# Patient Record
Sex: Female | Born: 1955 | Race: White | Hispanic: No | Marital: Married | Smoking: Never smoker
Health system: Southern US, Community
[De-identification: ages and names within clinical notes are randomized; demographics above are authoritative.]

## PROBLEM LIST (undated history)

## (undated) DIAGNOSIS — R17 Unspecified jaundice: Secondary | ICD-10-CM

## (undated) DIAGNOSIS — K922 Gastrointestinal hemorrhage, unspecified: Secondary | ICD-10-CM

## (undated) DIAGNOSIS — F419 Anxiety disorder, unspecified: Secondary | ICD-10-CM

## (undated) DIAGNOSIS — D721 Eosinophilia: Secondary | ICD-10-CM

## (undated) DIAGNOSIS — K7682 Hepatic encephalopathy: Secondary | ICD-10-CM

## (undated) DIAGNOSIS — R748 Abnormal levels of other serum enzymes: Secondary | ICD-10-CM

## (undated) DIAGNOSIS — L270 Generalized skin eruption due to drugs and medicaments taken internally: Secondary | ICD-10-CM

## (undated) DIAGNOSIS — K7290 Hepatic failure, unspecified without coma: Secondary | ICD-10-CM

## (undated) DIAGNOSIS — C801 Malignant (primary) neoplasm, unspecified: Secondary | ICD-10-CM

## (undated) DIAGNOSIS — D7212 Drug rash with eosinophilia and systemic symptoms syndrome: Secondary | ICD-10-CM

## (undated) DIAGNOSIS — T50905A Adverse effect of unspecified drugs, medicaments and biological substances, initial encounter: Secondary | ICD-10-CM

## (undated) HISTORY — PX: LIVER RESECTION: SHX1977

## (undated) HISTORY — PX: KNEE ARTHROSCOPY: SUR90

## (undated) HISTORY — PX: CHOLECYSTECTOMY: SHX55

---

## 1999-12-03 ENCOUNTER — Encounter: Payer: Self-pay | Admitting: Obstetrics and Gynecology

## 1999-12-03 ENCOUNTER — Encounter: Admission: RE | Admit: 1999-12-03 | Discharge: 1999-12-03 | Payer: Self-pay | Admitting: Obstetrics and Gynecology

## 2001-02-07 ENCOUNTER — Encounter: Admission: RE | Admit: 2001-02-07 | Discharge: 2001-02-07 | Payer: Self-pay | Admitting: Obstetrics and Gynecology

## 2001-02-07 ENCOUNTER — Encounter: Payer: Self-pay | Admitting: Obstetrics and Gynecology

## 2002-03-15 ENCOUNTER — Encounter: Payer: Self-pay | Admitting: Obstetrics and Gynecology

## 2002-03-15 ENCOUNTER — Encounter: Admission: RE | Admit: 2002-03-15 | Discharge: 2002-03-15 | Payer: Self-pay | Admitting: Obstetrics and Gynecology

## 2002-03-22 ENCOUNTER — Encounter: Payer: Self-pay | Admitting: Obstetrics and Gynecology

## 2002-03-22 ENCOUNTER — Encounter: Admission: RE | Admit: 2002-03-22 | Discharge: 2002-03-22 | Payer: Self-pay | Admitting: Obstetrics and Gynecology

## 2003-04-11 ENCOUNTER — Encounter: Admission: RE | Admit: 2003-04-11 | Discharge: 2003-04-11 | Payer: Self-pay | Admitting: Obstetrics and Gynecology

## 2004-04-28 ENCOUNTER — Encounter: Admission: RE | Admit: 2004-04-28 | Discharge: 2004-04-28 | Payer: Self-pay | Admitting: Obstetrics and Gynecology

## 2005-05-11 ENCOUNTER — Encounter: Admission: RE | Admit: 2005-05-11 | Discharge: 2005-05-11 | Payer: Self-pay | Admitting: Obstetrics and Gynecology

## 2006-09-06 ENCOUNTER — Encounter: Admission: RE | Admit: 2006-09-06 | Discharge: 2006-09-06 | Payer: Self-pay | Admitting: Obstetrics and Gynecology

## 2007-09-21 ENCOUNTER — Encounter: Admission: RE | Admit: 2007-09-21 | Discharge: 2007-09-21 | Payer: Self-pay | Admitting: Family Medicine

## 2007-11-02 ENCOUNTER — Other Ambulatory Visit: Admission: RE | Admit: 2007-11-02 | Discharge: 2007-11-02 | Payer: Self-pay | Admitting: Family Medicine

## 2008-11-05 ENCOUNTER — Encounter: Admission: RE | Admit: 2008-11-05 | Discharge: 2008-11-05 | Payer: Self-pay | Admitting: Family Medicine

## 2009-11-30 ENCOUNTER — Encounter: Admission: RE | Admit: 2009-11-30 | Discharge: 2009-11-30 | Payer: Self-pay | Admitting: Family Medicine

## 2010-04-09 ENCOUNTER — Other Ambulatory Visit: Payer: Self-pay | Admitting: Family Medicine

## 2010-04-09 ENCOUNTER — Other Ambulatory Visit (HOSPITAL_COMMUNITY)
Admission: RE | Admit: 2010-04-09 | Discharge: 2010-04-09 | Disposition: A | Discharge status: Home / Self Care | Payer: 59 | Source: Ambulatory Visit | Attending: Family Medicine | Admitting: Family Medicine

## 2010-04-09 DIAGNOSIS — Z01419 Encounter for gynecological examination (general) (routine) without abnormal findings: Secondary | ICD-10-CM | POA: Insufficient documentation

## 2011-01-03 ENCOUNTER — Other Ambulatory Visit: Payer: Self-pay | Admitting: Family Medicine

## 2011-01-03 DIAGNOSIS — Z1231 Encounter for screening mammogram for malignant neoplasm of breast: Secondary | ICD-10-CM

## 2011-01-26 ENCOUNTER — Ambulatory Visit
Admission: RE | Admit: 2011-01-26 | Discharge: 2011-01-26 | Disposition: A | Discharge status: Home / Self Care | Payer: 59 | Source: Ambulatory Visit | Attending: Family Medicine | Admitting: Family Medicine

## 2011-01-26 DIAGNOSIS — Z1231 Encounter for screening mammogram for malignant neoplasm of breast: Secondary | ICD-10-CM

## 2011-12-19 ENCOUNTER — Other Ambulatory Visit: Payer: Self-pay | Admitting: Family Medicine

## 2011-12-19 DIAGNOSIS — Z1231 Encounter for screening mammogram for malignant neoplasm of breast: Secondary | ICD-10-CM

## 2012-01-27 ENCOUNTER — Ambulatory Visit
Admission: RE | Admit: 2012-01-27 | Discharge: 2012-01-27 | Disposition: A | Discharge status: Home / Self Care | Payer: 59 | Source: Ambulatory Visit | Attending: Family Medicine | Admitting: Family Medicine

## 2012-01-27 DIAGNOSIS — Z1231 Encounter for screening mammogram for malignant neoplasm of breast: Secondary | ICD-10-CM

## 2013-02-19 ENCOUNTER — Other Ambulatory Visit: Payer: Self-pay

## 2013-02-19 DIAGNOSIS — Z1231 Encounter for screening mammogram for malignant neoplasm of breast: Secondary | ICD-10-CM

## 2013-03-12 ENCOUNTER — Other Ambulatory Visit: Payer: Self-pay | Admitting: Family Medicine

## 2013-03-12 DIAGNOSIS — R945 Abnormal results of liver function studies: Principal | ICD-10-CM

## 2013-03-12 DIAGNOSIS — R7989 Other specified abnormal findings of blood chemistry: Secondary | ICD-10-CM

## 2013-03-15 ENCOUNTER — Ambulatory Visit
Admission: RE | Admit: 2013-03-15 | Discharge: 2013-03-15 | Disposition: A | Discharge status: Home / Self Care | Payer: BC Managed Care – PPO | Source: Ambulatory Visit | Attending: Family Medicine | Admitting: Family Medicine

## 2013-03-15 DIAGNOSIS — R945 Abnormal results of liver function studies: Principal | ICD-10-CM

## 2013-03-15 DIAGNOSIS — R7989 Other specified abnormal findings of blood chemistry: Secondary | ICD-10-CM

## 2013-03-18 ENCOUNTER — Other Ambulatory Visit: Payer: Self-pay | Admitting: Gastroenterology

## 2013-03-18 DIAGNOSIS — R945 Abnormal results of liver function studies: Secondary | ICD-10-CM

## 2013-03-23 ENCOUNTER — Other Ambulatory Visit: Payer: BC Managed Care – PPO

## 2013-03-23 ENCOUNTER — Ambulatory Visit
Admission: RE | Admit: 2013-03-23 | Discharge: 2013-03-23 | Disposition: A | Discharge status: Home / Self Care | Payer: BC Managed Care – PPO | Source: Ambulatory Visit | Attending: Gastroenterology | Admitting: Gastroenterology

## 2013-03-23 DIAGNOSIS — R945 Abnormal results of liver function studies: Secondary | ICD-10-CM

## 2013-03-23 MED ORDER — GADOBENATE DIMEGLUMINE 529 MG/ML IV SOLN
11.0000 mL | Freq: Once | INTRAVENOUS | Status: AC | PRN
  Administered 2013-03-23: 11 mL via INTRAVENOUS

## 2013-03-25 ENCOUNTER — Ambulatory Visit
Admission: RE | Admit: 2013-03-25 | Discharge: 2013-03-25 | Disposition: A | Discharge status: Home / Self Care | Payer: BC Managed Care – PPO | Source: Ambulatory Visit

## 2013-03-25 DIAGNOSIS — Z1231 Encounter for screening mammogram for malignant neoplasm of breast: Secondary | ICD-10-CM

## 2013-03-26 ENCOUNTER — Encounter (HOSPITAL_COMMUNITY): Payer: Self-pay | Admitting: *Deleted

## 2013-03-26 ENCOUNTER — Encounter (HOSPITAL_COMMUNITY): Payer: Self-pay | Admitting: Pharmacy Technician

## 2013-03-26 DIAGNOSIS — R17 Unspecified jaundice: Secondary | ICD-10-CM

## 2013-03-26 DIAGNOSIS — R748 Abnormal levels of other serum enzymes: Secondary | ICD-10-CM

## 2013-03-26 HISTORY — DX: Abnormal levels of other serum enzymes: R74.8

## 2013-03-26 HISTORY — DX: Unspecified jaundice: R17

## 2013-04-03 ENCOUNTER — Other Ambulatory Visit: Payer: Self-pay | Admitting: Gastroenterology

## 2013-04-03 ENCOUNTER — Encounter (HOSPITAL_COMMUNITY): Payer: BC Managed Care – PPO | Admitting: Certified Registered Nurse Anesthetist

## 2013-04-03 ENCOUNTER — Ambulatory Visit (HOSPITAL_COMMUNITY): Payer: BC Managed Care – PPO | Admitting: Certified Registered Nurse Anesthetist

## 2013-04-03 ENCOUNTER — Encounter (HOSPITAL_COMMUNITY)
Admission: RE | Disposition: A | Discharge status: Home / Self Care | Payer: Self-pay | Source: Ambulatory Visit | Attending: Gastroenterology

## 2013-04-03 ENCOUNTER — Ambulatory Visit (HOSPITAL_COMMUNITY)
Admission: RE | Admit: 2013-04-03 | Discharge: 2013-04-03 | Disposition: A | Discharge status: Home / Self Care | Payer: BC Managed Care – PPO | Source: Ambulatory Visit | Attending: Gastroenterology | Admitting: Gastroenterology

## 2013-04-03 ENCOUNTER — Encounter (HOSPITAL_COMMUNITY): Payer: Self-pay | Admitting: *Deleted

## 2013-04-03 DIAGNOSIS — K769 Liver disease, unspecified: Secondary | ICD-10-CM | POA: Insufficient documentation

## 2013-04-03 DIAGNOSIS — K838 Other specified diseases of biliary tract: Secondary | ICD-10-CM | POA: Insufficient documentation

## 2013-04-03 DIAGNOSIS — K831 Obstruction of bile duct: Secondary | ICD-10-CM

## 2013-04-03 DIAGNOSIS — K219 Gastro-esophageal reflux disease without esophagitis: Secondary | ICD-10-CM | POA: Insufficient documentation

## 2013-04-03 HISTORY — DX: Anxiety disorder, unspecified: F41.9

## 2013-04-03 HISTORY — DX: Unspecified jaundice: R17

## 2013-04-03 HISTORY — DX: Abnormal levels of other serum enzymes: R74.8

## 2013-04-03 HISTORY — PX: EUS: SHX5427

## 2013-04-03 LAB — COMPREHENSIVE METABOLIC PANEL
ALK PHOS: 1432 U/L — AB (ref 39–117)
ALT: 541 U/L — AB (ref 0–35)
AST: 385 U/L — ABNORMAL HIGH (ref 0–37)
Albumin: 2.4 g/dL — ABNORMAL LOW (ref 3.5–5.2)
BILIRUBIN TOTAL: 15.7 mg/dL — AB (ref 0.3–1.2)
BUN: 14 mg/dL (ref 6–23)
CO2: 25 meq/L (ref 19–32)
Calcium: 9.4 mg/dL (ref 8.4–10.5)
Chloride: 100 mEq/L (ref 96–112)
Creatinine, Ser: 0.66 mg/dL (ref 0.50–1.10)
GFR calc Af Amer: >90 mL/min (ref >90)
GLUCOSE: 81 mg/dL (ref 70–99)
POTASSIUM: 4.7 meq/L (ref 3.7–5.3)
Sodium: 136 mEq/L — ABNORMAL LOW (ref 137–147)
Total Protein: 6.3 g/dL (ref 6.0–8.3)

## 2013-04-03 LAB — PROTIME-INR
INR: 1.04 (ref 0.00–1.49)
Prothrombin Time: 13.4 s (ref 11.6–15.2)

## 2013-04-03 SURGERY — ESOPHAGEAL ENDOSCOPIC ULTRASOUND (EUS) RADIAL
Anesthesia: Monitor Anesthesia Care

## 2013-04-03 MED ORDER — BUTAMBEN-TETRACAINE-BENZOCAINE 2-2-14 % EX AERO
INHALATION_SPRAY | CUTANEOUS | Status: DC | PRN
  Administered 2013-04-03: 2 via TOPICAL

## 2013-04-03 MED ORDER — ONDANSETRON HCL 4 MG/2ML IJ SOLN
INTRAMUSCULAR | Status: DC | PRN
  Administered 2013-04-03: 4 mg via INTRAVENOUS

## 2013-04-03 MED ORDER — SODIUM CHLORIDE 0.9 % IV SOLN: INTRAVENOUS | Status: DC

## 2013-04-03 MED ORDER — ONDANSETRON HCL 4 MG/2ML IJ SOLN
INTRAMUSCULAR | Status: AC
  Filled 2013-04-03: qty 2

## 2013-04-03 MED ORDER — PROPOFOL INFUSION 10 MG/ML OPTIME
INTRAVENOUS | Status: DC | PRN
  Administered 2013-04-03: 140 ug/kg/min via INTRAVENOUS

## 2013-04-03 MED ORDER — CHOLESTYRAMINE 4 G PO PACK: 4.0000 g | PACK | Freq: Two times a day (BID) | ORAL | Status: DC

## 2013-04-03 MED ORDER — KETAMINE HCL 10 MG/ML IJ SOLN
INTRAMUSCULAR | Status: DC | PRN
  Administered 2013-04-03: 20 mg via INTRAVENOUS

## 2013-04-03 MED ORDER — LIDOCAINE HCL (CARDIAC) 20 MG/ML IV SOLN
INTRAVENOUS | Status: AC
  Filled 2013-04-03: qty 5

## 2013-04-03 MED ORDER — MIDAZOLAM HCL 2 MG/2ML IJ SOLN
INTRAMUSCULAR | Status: AC
  Filled 2013-04-03: qty 2

## 2013-04-03 MED ORDER — LIDOCAINE HCL (CARDIAC) 20 MG/ML IV SOLN
INTRAVENOUS | Status: DC | PRN
  Administered 2013-04-03: 100 mg via INTRAVENOUS

## 2013-04-03 MED ORDER — PROPOFOL 10 MG/ML IV BOLUS
INTRAVENOUS | Status: AC
  Filled 2013-04-03: qty 20

## 2013-04-03 MED ORDER — MIDAZOLAM HCL 5 MG/5ML IJ SOLN
INTRAMUSCULAR | Status: DC | PRN
  Administered 2013-04-03 (×2): 2 mg via INTRAVENOUS

## 2013-04-03 MED ORDER — LACTATED RINGERS IV SOLN
INTRAVENOUS | Status: DC
  Administered 2013-04-03: 1000 mL via INTRAVENOUS

## 2013-04-03 NOTE — Anesthesia Postprocedure Evaluation (Signed)
  Anesthesia Post-op Note  Patient: Joann Castaneda  Procedure(s) Performed: Procedure(s) (LRB): ESOPHAGEAL ENDOSCOPIC ULTRASOUND (EUS) RADIAL (N/A)  Patient Location: PACU  Anesthesia Type: MAC  Level of Consciousness: awake and alert   Airway and Oxygen Therapy: Patient Spontanous Breathing  Post-op Pain: mild  Post-op Assessment: Post-op Vital signs reviewed, Patient's Cardiovascular Status Stable, Respiratory Function Stable, Patent Airway and No signs of Nausea or vomiting  Last Vitals:  Filed Vitals:   04/03/13 1237  BP: 134/76  Pulse: 79  Temp: 36.8 C  Resp: 16    Post-op Vital Signs: stable   Complications: No apparent anesthesia complications

## 2013-04-03 NOTE — Preoperative (Signed)
Beta Blockers   Reason not to administer Beta Blockers:Not Applicable 

## 2013-04-03 NOTE — H&P (Signed)
Patient interval history reviewed.  Patient examined again.  There has been no change from documented H/P dated 03/18/13 (scanned into chart from our office) except as documented above.  Assessment:  1.  Obstructive jaundice. 2.  Mass left lateral segment of liver.  Plan:  1.  Endoscopic ultrasound with possible biopsies (fine needle aspiration, FNA). 2.  Risks (bleeding, infection, bowel perforation that could require surgery, sedation-related changes in cardiopulmonary systems), benefits (identification and possible treatment of source of symptoms, exclusion of certain causes of symptoms), and alternatives (watchful waiting, radiographic imaging studies, empiric medical treatment) of upper endoscopy with ultrasound and possible fine needle aspiration (EUS +/- FNA) were explained to patient/family in detail and patient wishes to proceed.

## 2013-04-03 NOTE — Discharge Instructions (Signed)

## 2013-04-03 NOTE — Addendum Note (Signed)
Addended by: Osceola Depaz on: 04/03/2013 09:30 AM   Modules accepted: Orders  

## 2013-04-03 NOTE — Transfer of Care (Signed)
Immediate Anesthesia Transfer of Care Note  Patient: Joann Castaneda  Procedure(s) Performed: Procedure(s) (LRB): ESOPHAGEAL ENDOSCOPIC ULTRASOUND (EUS) RADIAL (N/A)  Patient Location: PACU  Anesthesia Type: MAC  Level of Consciousness: sedated, patient cooperative and responds to stimulation  Airway & Oxygen Therapy: Patient Spontanous Breathing and Patient connected to face mask oxgen  Post-op Assessment: Report given to PACU RN and Post -op Vital signs reviewed and stable  Post vital signs: Reviewed and stable  Complications: No apparent anesthesia complications

## 2013-04-03 NOTE — Anesthesia Preprocedure Evaluation (Addendum)
Anesthesia Evaluation  Patient identified by MRN, date of birth, ID band Patient awake    Reviewed: Allergy & Precautions, H&P , NPO status , Patient's Chart, lab work & pertinent test results  Airway Mallampati: II  TM Distance: >3 FB Neck ROM: Full    Dental no notable dental hx.    Pulmonary neg pulmonary ROS,  breath sounds clear to auscultation  Pulmonary exam normal       Cardiovascular negative cardio ROS  Rhythm:Regular Rate:Normal     Neuro/Psych negative neurological ROS  negative psych ROS   GI/Hepatic Neg liver ROS, GERD-  Medicated,  Endo/Other  negative endocrine ROS  Renal/GU negative Renal ROS  negative genitourinary   Musculoskeletal negative musculoskeletal ROS (+)   Abdominal   Peds negative pediatric ROS (+)  Hematology negative hematology ROS (+)   Anesthesia Other Findings   Reproductive/Obstetrics negative OB ROS                             Anesthesia Physical Anesthesia Plan  ASA: II  Anesthesia Plan: MAC   Post-op Pain Management:    Induction: Intravenous  Airway Management Planned: Nasal Cannula  Additional Equipment:   Intra-op Plan:   Post-operative Plan:   Informed Consent: I have reviewed the patients History and Physical, chart, labs and discussed the procedure including the risks, benefits and alternatives for the proposed anesthesia with the patient or authorized representative who has indicated his/her understanding and acceptance.   Dental advisory given  Plan Discussed with: CRNA and Surgeon  Anesthesia Plan Comments:         Anesthesia Quick Evaluation  

## 2013-04-03 NOTE — Op Note (Signed)
[facility] [place], [address]   ENDOSCOPIC ULTRASOUND PROCEDURE REPORT  PATIENT: Joann Castaneda, Joann Castaneda  MR#: [id number] BIRTHDATE: Jul 09, 1955  GENDER: Female ENDOSCOPIST: Arta Silence, MD REFERRED BY:  Teena Irani, M.D. PROCEDURE DATE:  04/03/2013 PROCEDURE:   Upper EUS ASA CLASS:      Class II INDICATIONS:   1.  obstructive jaundice, dilated left intrahepatic bile ducts, liver mass. MEDICATIONS: MAC sedation, administered by CRNA and Cetacaine spray x 2  DESCRIPTION OF PROCEDURE:   After the risks benefits and alternatives of the procedure were  explained, informed consent was obtained. The patient was then placed in the left, lateral, decubitus postion and IV sedation was administered. Throughout the procedure, the patients blood pressure, pulse and oxygen saturations were monitored continuously.  Under direct visualization, the Pentax EUS Linear [id number]  endoscope was introduced through the mouth  and advanced to the second portion of the duodenum .  Water was used as necessary to provide an acoustic interface.  Upon completion of the imaging, water was removed and the patient was sent to the recovery room in satisfactory condition.     FINDINGS:      Pancreatic head, body and tail appeared normal without features of chronic pancreatitis or pancreatic mass. Pancreatic duct and common bile duct were not dilated.  Although recent MRI suggested porta hepatis adenopathy, I could not discern a periportal mass or adenopathy.  Significant left intrahepatic biliary ductal dilatation.  Despite extensive search, I could not locate a distinct lesion in the left intrahepatic system.  IMPRESSION:     As above.  Left intrahepatic biliary ductal dilatation seen, but I could locate a distinct mass, likely due to anatomic limitations of the EUS scope with visualization here.  RECOMMENDATIONS:     1.  Watch for potential complications of procedure. 2.   Cholestyramine powder 4 g po bid for pruritus. 3.  Will discuss case with Dr. Amedeo Plenty.  Patient will need expedited imaging-guided biopsy and biliary decompression (which would have to be done via percutaneous transhepatic cholangiography).   _______________________________ Lorrin MaisArta Silence, MD 04/03/2013 2:50 PM   CC:

## 2013-04-04 ENCOUNTER — Encounter (HOSPITAL_COMMUNITY): Payer: Self-pay | Admitting: Gastroenterology

## 2013-10-05 ENCOUNTER — Encounter (HOSPITAL_COMMUNITY): Payer: Self-pay | Admitting: Emergency Medicine

## 2013-10-05 ENCOUNTER — Emergency Department (HOSPITAL_COMMUNITY)
Admission: EM | Admit: 2013-10-05 | Discharge: 2013-10-06 | Disposition: A | Discharge status: Home / Self Care | Payer: BC Managed Care – PPO | Attending: Emergency Medicine | Admitting: Emergency Medicine

## 2013-10-05 DIAGNOSIS — C221 Intrahepatic bile duct carcinoma: Secondary | ICD-10-CM | POA: Diagnosis not present

## 2013-10-05 DIAGNOSIS — F411 Generalized anxiety disorder: Secondary | ICD-10-CM | POA: Insufficient documentation

## 2013-10-05 DIAGNOSIS — D649 Anemia, unspecified: Secondary | ICD-10-CM | POA: Insufficient documentation

## 2013-10-05 DIAGNOSIS — Z880 Allergy status to penicillin: Secondary | ICD-10-CM | POA: Diagnosis not present

## 2013-10-05 DIAGNOSIS — R509 Fever, unspecified: Secondary | ICD-10-CM | POA: Insufficient documentation

## 2013-10-05 DIAGNOSIS — Z79899 Other long term (current) drug therapy: Secondary | ICD-10-CM | POA: Diagnosis not present

## 2013-10-05 DIAGNOSIS — R000 Tachycardia, unspecified: Secondary | ICD-10-CM | POA: Insufficient documentation

## 2013-10-05 HISTORY — DX: Malignant (primary) neoplasm, unspecified: C80.1

## 2013-10-05 NOTE — ED Notes (Signed)
Bed: WA01 Expected date:  Expected time:  Means of arrival:  Comments: Triage 2 

## 2013-10-05 NOTE — ED Notes (Signed)
Pt presents with c/o fever. Pt is a cancer patient, Wednesday was her first chemo treatment. Pt says that she called her doctor at [facility] earlier tonight because her fever was 104. Pt was told to come here and her doctor wanted her to have some lab work drawn and then wanted our ER physician to call him at [facility] so that he could make a decision as to how to treat her from there. Pt also reports that she has vomited approx 4 times in the last 24 hours. Also reports feeling very fatigued over the last three days.

## 2013-10-06 ENCOUNTER — Emergency Department (HOSPITAL_COMMUNITY): Payer: BC Managed Care – PPO

## 2013-10-06 LAB — COMPREHENSIVE METABOLIC PANEL
ALBUMIN: 2.9 g/dL — AB (ref 3.5–5.2)
ALK PHOS: 1151 U/L — AB (ref 39–117)
ALT: 438 U/L — ABNORMAL HIGH (ref 0–35)
ANION GAP: 13 (ref 5–15)
AST: 760 U/L — ABNORMAL HIGH (ref 0–37)
BUN: 17 mg/dL (ref 6–23)
CO2: 20 mEq/L (ref 19–32)
Calcium: 9 mg/dL (ref 8.4–10.5)
Chloride: 101 mEq/L (ref 96–112)
Creatinine, Ser: 0.82 mg/dL (ref 0.50–1.10)
GFR calc Af Amer: 88 mL/min — ABNORMAL LOW (ref >90)
GFR calc non Af Amer: 76 mL/min — ABNORMAL LOW (ref >90)
Glucose, Bld: 99 mg/dL (ref 70–99)
Potassium: 4.1 mEq/L (ref 3.7–5.3)
SODIUM: 134 meq/L — AB (ref 137–147)
TOTAL PROTEIN: 8 g/dL (ref 6.0–8.3)
Total Bilirubin: 2 mg/dL — ABNORMAL HIGH (ref 0.3–1.2)

## 2013-10-06 LAB — CBC WITH DIFFERENTIAL/PLATELET
BASOS ABS: 0 10*3/uL (ref 0.0–0.1)
Basophils Relative: 0 % (ref 0–1)
Eosinophils Absolute: 0 10*3/uL (ref 0.0–0.7)
Eosinophils Relative: 1 % (ref 0–5)
HCT: 18.3 % — ABNORMAL LOW (ref 36.0–46.0)
Hemoglobin: 6 g/dL — CL (ref 12.0–15.0)
LYMPHS PCT: 5 % — AB (ref 12–46)
Lymphs Abs: 0.4 10*3/uL — ABNORMAL LOW (ref 0.7–4.0)
MCH: 25.4 pg — ABNORMAL LOW (ref 26.0–34.0)
MCHC: 32.8 g/dL (ref 30.0–36.0)
MCV: 77.5 fL — ABNORMAL LOW (ref 78.0–100.0)
Monocytes Absolute: 0.2 10*3/uL (ref 0.1–1.0)
Monocytes Relative: 2 % — ABNORMAL LOW (ref 3–12)
NEUTROS ABS: 6.1 10*3/uL (ref 1.7–7.7)
Neutrophils Relative %: 92 % — ABNORMAL HIGH (ref 43–77)
PLATELETS: 201 10*3/uL (ref 150–400)
RBC: 2.36 MIL/uL — ABNORMAL LOW (ref 3.87–5.11)
RDW: 17.1 % — AB (ref 11.5–15.5)
WBC: 6.6 10*3/uL (ref 4.0–10.5)

## 2013-10-06 LAB — URINALYSIS, ROUTINE W REFLEX MICROSCOPIC
Bilirubin Urine: NEGATIVE
Glucose, UA: NEGATIVE mg/dL
Hgb urine dipstick: NEGATIVE
Ketones, ur: NEGATIVE mg/dL
Leukocytes, UA: NEGATIVE
NITRITE: NEGATIVE
Protein, ur: NEGATIVE mg/dL
SPECIFIC GRAVITY, URINE: 1.013 (ref 1.005–1.030)
UROBILINOGEN UA: 1 mg/dL (ref 0.0–1.0)
pH: 6.5 (ref 5.0–8.0)

## 2013-10-06 LAB — PREPARE RBC (CROSSMATCH)

## 2013-10-06 LAB — LIPASE, BLOOD: Lipase: 95 U/L — ABNORMAL HIGH (ref 11–59)

## 2013-10-06 LAB — I-STAT CG4 LACTIC ACID, ED: Lactic Acid, Venous: 1.58 mmol/L (ref 0.5–2.2)

## 2013-10-06 LAB — ABO/RH: ABO/RH(D): A POS

## 2013-10-06 MED ORDER — ACETAMINOPHEN 325 MG PO TABS
650.0000 mg | ORAL_TABLET | Freq: Once | ORAL | Status: AC
  Administered 2013-10-06: 650 mg via ORAL
  Filled 2013-10-06: qty 2

## 2013-10-06 MED ORDER — SODIUM CHLORIDE 0.9 % IV SOLN
10.0000 mL/h | Freq: Once | INTRAVENOUS | Status: AC
  Administered 2013-10-06: 10 mL/h via INTRAVENOUS

## 2013-10-06 NOTE — Discharge Instructions (Signed)
Follow up with your doctors at [facility] as scheduled on on Monday.  Return to the ER for worsening condition or new concerning symptoms.   Fever, Adult A fever is a higher than normal body temperature. In an adult, an oral temperature around 98.6 F (37 C) is considered normal. A temperature of 100.4 F (38 C) or higher is generally considered a fever. Mild or moderate fevers generally have no long-term effects and often do not require treatment. Extreme fever (greater than or equal to 106 F or 41.1 C) can cause seizures. The sweating that may occur with repeated or prolonged fever may cause dehydration. Elderly people can develop confusion during a fever. A measured temperature can vary with:  Age.  Time of day.  Method of measurement (mouth, underarm, rectal, or ear). The fever is confirmed by taking a temperature with a thermometer. Temperatures can be taken different ways. Some methods are accurate and some are not.  An oral temperature is used most commonly. Electronic thermometers are fast and accurate.  An ear temperature will only be accurate if the thermometer is positioned as recommended by the manufacturer.  A rectal temperature is accurate and done for those adults who have a condition where an oral temperature cannot be taken.  An underarm (axillary) temperature is not accurate and not recommended. Fever is a symptom, not a disease.  CAUSES   Infections commonly cause fever.  Some noninfectious causes for fever include:  Some arthritis conditions.  Some thyroid or adrenal gland conditions.  Some immune system conditions.  Some types of cancer.  A medicine reaction.  High doses of certain street drugs such as methamphetamine.  Dehydration.  Exposure to high outside or room temperatures.  Occasionally, the source of a fever cannot be determined. This is sometimes called a "fever of unknown origin" (FUO).  Some situations may lead to a temporary rise in body  temperature that may go away on its own. Examples are:  Childbirth.  Surgery.  Intense exercise. HOME CARE INSTRUCTIONS   Take appropriate medicines for fever. Follow dosing instructions carefully. If you use acetaminophen to reduce the fever, be careful to avoid taking other medicines that also contain acetaminophen. Do not take aspirin for a fever if you are younger than age 85. There is an association with Reye's syndrome. Reye's syndrome is a rare but potentially deadly disease.  If an infection is present and antibiotics have been prescribed, take them as directed. Finish them even if you start to feel better.  Rest as needed.  Maintain an adequate fluid intake. To prevent dehydration during an illness with prolonged or recurrent fever, you may need to drink extra fluid.Drink enough fluids to keep your urine clear or pale yellow.  Sponging or bathing with room temperature water may help reduce body temperature. Do not use ice water or alcohol sponge baths.  Dress comfortably, but do not over-bundle. SEEK MEDICAL CARE IF:   You are unable to keep fluids down.  You develop vomiting or diarrhea.  You are not feeling at least partly better after 3 days.  You develop new symptoms or problems. SEEK IMMEDIATE MEDICAL CARE IF:   You have shortness of breath or trouble breathing.  You develop excessive weakness.  You are dizzy or you faint.  You are extremely thirsty or you are making little or no urine.  You develop new pain that was not there before (such as in the head, neck, chest, back, or abdomen).  You have persistent vomiting  and diarrhea for more than 1 to 2 days.  You develop a stiff neck or your eyes become sensitive to light.  You develop a skin rash.  You have a fever or persistent symptoms for more than 2 to 3 days.  You have a fever and your symptoms suddenly get worse. MAKE SURE YOU:   Understand these instructions.  Will watch your  condition.  Will get help right away if you are not doing well or get worse. Document Released: 08/03/2000 Document Revised: 06/24/2013 Document Reviewed: 12/09/2010 [facility] Patient Information 2015 [place], [place]. This information is not intended to replace advice given to you by your health care provider. Make sure you discuss any questions you have with your health care provider.

## 2013-10-06 NOTE — ED Notes (Signed)
Critical Hgb 6.0 Called and verified with [facility] (lab) Primary nurse and MD notified

## 2013-10-06 NOTE — ED Provider Notes (Signed)
CSN: [id number]     Arrival date & time 10/05/13  2310 History   First MD Initiated Contact with Patient 10/06/13 0023     Chief Complaint  Patient presents with  . 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  Patient recently started chemotherapy and radiation on Wednesday.  Starting on Friday she developed fevers, though she thinks that she was probably febrile on Thursday.  Fever has been as high as 104.  She contacted the on-call oncologist at [facility] who recommended she come to the emergency department.  Patient had one episode of vomiting today.  She reports generalized fatigue since having chemotherapy.  She denies any urinary symptoms, no cough no sore throat no URI symptoms no abdominal pain no diarrhea. Past Medical History  Diagnosis Date  . Anxiety   . Elevated liver enzymes 03-26-13  . Jaundice 03-26-13    Jaundice -presently skin and sclera  . Cancer    Past Surgical History  Procedure Laterality Date  . Knee arthroscopy Bilateral     both knees  . Eus N/A 04/03/2013    Procedure: ESOPHAGEAL ENDOSCOPIC ULTRASOUND (EUS) RADIAL;  Surgeon: Arta Silence, MD;  Location: [facility] ENDOSCOPY;  Service: Endoscopy;  Laterality: N/A;  bx of liver lesion   No family history on file. History  Substance Use Topics  . Smoking status: Never Smoker   . Smokeless tobacco: Not on file  . Alcohol Use: No   OB History   Grav Para Term Preterm Abortions TAB SAB Ect Mult Living                 Review of Systems   See History of Present Illness; otherwise all other systems are reviewed and negative  Allergies  Rocephin; Vancomycin; Zosyn; Amoxicillin; and Sulfa antibiotics  Home Medications   Prior to Admission  medications   Medication Sig Start Date End Date Taking? Authorizing Provider  capecitabine (XELODA) 500 MG tablet Take 500 mg by mouth 2 (two) times daily after a meal. Only taken during the weekdays. She does not take this medication on the weekend   Yes Historical Provider, MD  Probiotic Product (ALIGN) 4 MG CAPS Take 4 mg by mouth daily.   Yes Historical Provider, MD  sertraline (ZOLOFT) 25 MG tablet Take 25 mg by mouth every morning.   Yes Historical Provider, MD   BP 140/74  Pulse 116  Temp(Src) 102.8 F (39.3 C) (Oral)  Resp 24  SpO2 97% Physical Exam  Nursing note and vitals reviewed. Constitutional: She is oriented to person, place, and time. She appears well-developed and well-nourished. No distress.  HENT:  Head: Normocephalic and atraumatic.  Nose: Nose normal.  Mouth/Throat: Oropharynx is clear and moist.  Eyes: Conjunctivae and EOM are normal. Pupils are equal, round, and reactive to light.  Neck: Normal range of motion. Neck supple. No JVD present. No tracheal deviation present. No thyromegaly present.  Cardiovascular: Regular rhythm, normal heart sounds and intact distal pulses.  Exam reveals no gallop and no friction rub.   No murmur heard. Tachycardia noted  Pulmonary/Chest: Effort normal and breath sounds normal. No stridor. No respiratory distress. She has no wheezes. She has no rales. She exhibits no tenderness.  Abdominal: Soft. Bowel sounds are normal. She exhibits no distension and no mass. There is no  tenderness. There is no rebound and no guarding.  Surgical scar noted  Musculoskeletal: Normal range of motion. She exhibits no edema and no tenderness.  Lymphadenopathy:    She has no cervical adenopathy.  Neurological: She is alert and oriented to person, place, and time. She has normal reflexes. No cranial nerve deficit. She exhibits normal muscle tone. Coordination normal.  Skin: Skin is warm and dry. No rash noted. No erythema. No pallor.  Psychiatric: She  has a normal mood and affect. Her behavior is normal. Judgment and thought content normal.    ED Course  Procedures (including critical care time) Labs Review Labs Reviewed  CBC WITH DIFFERENTIAL - Abnormal; Notable for the following:    RBC 2.36 (*)    Hemoglobin 6.0 (*)    HCT 18.3 (*)    MCV 77.5 (*)    MCH 25.4 (*)    RDW 17.1 (*)    Neutrophils Relative % 92 (*)    Lymphocytes Relative 5 (*)    Lymphs Abs 0.4 (*)    Monocytes Relative 2 (*)    All other components within normal limits  URINALYSIS, ROUTINE W REFLEX MICROSCOPIC - Abnormal; Notable for the following:    Color, Urine AMBER (*)    All other components within normal limits  COMPREHENSIVE METABOLIC PANEL - Abnormal; Notable for the following:    Sodium 134 (*)    Albumin 2.9 (*)    AST 760 (*)    ALT 438 (*)    Alkaline Phosphatase 1151 (*)    Total Bilirubin 2.0 (*)    GFR calc non Af Amer 76 (*)    GFR calc Af Amer 88 (*)    All other components within normal limits  CULTURE, BLOOD (ROUTINE X 2)  CULTURE, BLOOD (ROUTINE X 2)  URINE CULTURE  LIPASE, BLOOD  I-STAT CG4 LACTIC ACID, ED    Imaging Review Dg Chest 2 View  10/06/2013   CLINICAL DATA:  Fever.  EXAM: CHEST  2 VIEW  COMPARISON:  None.  FINDINGS: The lungs are well-aerated. Minimal right basilar opacity likely reflects atelectasis. There is no evidence of pleural effusion or pneumothorax.  The heart is normal in size; the mediastinal contour is within normal limits. No acute osseous abnormalities are seen.  IMPRESSION: Minimal right basilar opacity likely reflects atelectasis; lungs otherwise clear.   Electronically Signed   By: Garald Balding M.D.   On: 10/06/2013 01:13     EKG Interpretation None      MDM   Final diagnoses:  Fever, unspecified fever cause  Cholangiocarcinoma  Anemia requiring transfusions    58 yo female with fevers after recent start of chemo and radiation.  Concern for neutropenia.  Will get work up for fever:  Chest  xray, labs with blood and urine culture.  Pt's physician at [facility] is requesting return call with results.  2:40 AM Case discussed with Dr. Rigoberto Noel, on-call oncologist at [facility].  He recommends transfusion of one unit of packed red blood cells given anemia.  Patient has followup scheduled on Monday, he recommends patient followup as scheduled.  He does not feel she needs antibiotics at this time.  Patient is agreeable to this plan.  She's been consented for blood transfusion.  No specific cause for fever noted today.    Kalman Drape, MD 10/06/13 [phone]

## 2013-10-07 ENCOUNTER — Telehealth (HOSPITAL_BASED_OUTPATIENT_CLINIC_OR_DEPARTMENT_OTHER): Payer: Self-pay

## 2013-10-07 ENCOUNTER — Telehealth (HOSPITAL_BASED_OUTPATIENT_CLINIC_OR_DEPARTMENT_OTHER): Payer: Self-pay | Admitting: Emergency Medicine

## 2013-10-07 LAB — TYPE AND SCREEN
ABO/RH(D): A POS
ANTIBODY SCREEN: NEGATIVE
Unit division: 0

## 2013-10-07 LAB — URINE CULTURE
Colony Count: NO GROWTH
Culture: NO GROWTH

## 2013-10-07 NOTE — Telephone Encounter (Signed)
Call from [facility] w/ (+) Bld cx (1 of 4 bottles, areorobic) Gram (-) Rods.  Char to MD for review.

## 2013-10-09 LAB — CULTURE, BLOOD (ROUTINE X 2)

## 2013-10-10 NOTE — Telephone Encounter (Signed)
Faxed final C&S results for blood culture done on 10/06/13 to Dr. Rigoberto Noel @ [facility] fax # [phone]

## 2013-10-12 LAB — CULTURE, BLOOD (ROUTINE X 2): Culture: NO GROWTH

## 2013-10-28 ENCOUNTER — Encounter (HOSPITAL_COMMUNITY): Payer: Self-pay | Admitting: Emergency Medicine

## 2013-10-28 ENCOUNTER — Emergency Department (HOSPITAL_COMMUNITY)
Admission: EM | Admit: 2013-10-28 | Discharge: 2013-10-29 | Payer: BC Managed Care – PPO | Attending: Emergency Medicine | Admitting: Emergency Medicine

## 2013-10-28 ENCOUNTER — Emergency Department (HOSPITAL_COMMUNITY): Payer: BC Managed Care – PPO

## 2013-10-28 DIAGNOSIS — R5383 Other fatigue: Secondary | ICD-10-CM

## 2013-10-28 DIAGNOSIS — F411 Generalized anxiety disorder: Secondary | ICD-10-CM | POA: Insufficient documentation

## 2013-10-28 DIAGNOSIS — R509 Fever, unspecified: Secondary | ICD-10-CM | POA: Diagnosis not present

## 2013-10-28 DIAGNOSIS — Z880 Allergy status to penicillin: Secondary | ICD-10-CM | POA: Insufficient documentation

## 2013-10-28 DIAGNOSIS — Z872 Personal history of diseases of the skin and subcutaneous tissue: Secondary | ICD-10-CM | POA: Diagnosis not present

## 2013-10-28 DIAGNOSIS — Z79899 Other long term (current) drug therapy: Secondary | ICD-10-CM | POA: Diagnosis not present

## 2013-10-28 DIAGNOSIS — R000 Tachycardia, unspecified: Secondary | ICD-10-CM | POA: Insufficient documentation

## 2013-10-28 DIAGNOSIS — G8929 Other chronic pain: Secondary | ICD-10-CM | POA: Diagnosis not present

## 2013-10-28 DIAGNOSIS — R5381 Other malaise: Secondary | ICD-10-CM | POA: Insufficient documentation

## 2013-10-28 DIAGNOSIS — C221 Intrahepatic bile duct carcinoma: Secondary | ICD-10-CM | POA: Insufficient documentation

## 2013-10-28 HISTORY — DX: Generalized skin eruption due to drugs and medicaments taken internally: L27.0

## 2013-10-28 HISTORY — DX: Drug rash with eosinophilia and systemic symptoms syndrome: D72.12

## 2013-10-28 HISTORY — DX: Eosinophilia: D72.1

## 2013-10-28 HISTORY — DX: Adverse effect of unspecified drugs, medicaments and biological substances, initial encounter: T50.905A

## 2013-10-28 LAB — COMPREHENSIVE METABOLIC PANEL
ALK PHOS: 1390 U/L — AB (ref 39–117)
ALT: 110 U/L — AB (ref 0–35)
AST: 262 U/L — ABNORMAL HIGH (ref 0–37)
Albumin: 2.8 g/dL — ABNORMAL LOW (ref 3.5–5.2)
Anion gap: 12 (ref 5–15)
BUN: 21 mg/dL (ref 6–23)
CHLORIDE: 101 meq/L (ref 96–112)
CO2: 21 meq/L (ref 19–32)
Calcium: 9 mg/dL (ref 8.4–10.5)
Creatinine, Ser: 0.81 mg/dL (ref 0.50–1.10)
GFR, EST AFRICAN AMERICAN: 90 mL/min — AB (ref >90)
GFR, EST NON AFRICAN AMERICAN: 77 mL/min — AB (ref >90)
GLUCOSE: 93 mg/dL (ref 70–99)
POTASSIUM: 5.2 meq/L (ref 3.7–5.3)
Sodium: 134 mEq/L — ABNORMAL LOW (ref 137–147)
Total Bilirubin: 1.2 mg/dL (ref 0.3–1.2)
Total Protein: 7.8 g/dL (ref 6.0–8.3)

## 2013-10-28 LAB — CBC WITH DIFFERENTIAL/PLATELET
BASOS PCT: 0 % (ref 0–1)
Basophils Absolute: 0 10*3/uL (ref 0.0–0.1)
EOS PCT: 1 % (ref 0–5)
Eosinophils Absolute: 0 10*3/uL (ref 0.0–0.7)
HCT: 29.8 % — ABNORMAL LOW (ref 36.0–46.0)
HEMOGLOBIN: 10.1 g/dL — AB (ref 12.0–15.0)
LYMPHS PCT: 3 % — AB (ref 12–46)
Lymphs Abs: 0.1 10*3/uL — ABNORMAL LOW (ref 0.7–4.0)
MCH: 26.9 pg (ref 26.0–34.0)
MCHC: 33.9 g/dL (ref 30.0–36.0)
MCV: 79.3 fL (ref 78.0–100.0)
MONOS PCT: 13 % — AB (ref 3–12)
Monocytes Absolute: 0.5 10*3/uL (ref 0.1–1.0)
NEUTROS PCT: 83 % — AB (ref 43–77)
Neutro Abs: 3 10*3/uL (ref 1.7–7.7)
Platelets: 137 10*3/uL — ABNORMAL LOW (ref 150–400)
RBC: 3.76 MIL/uL — AB (ref 3.87–5.11)
RDW: 21.6 % — ABNORMAL HIGH (ref 11.5–15.5)
WBC: 3.6 10*3/uL — AB (ref 4.0–10.5)

## 2013-10-28 LAB — LIPASE, BLOOD: Lipase: 32 U/L (ref 11–59)

## 2013-10-28 LAB — TROPONIN I: Troponin I: <0.3 ng/mL (ref <0.30)

## 2013-10-28 LAB — I-STAT CG4 LACTIC ACID, ED: Lactic Acid, Venous: 0.85 mmol/L (ref 0.5–2.2)

## 2013-10-28 MED ORDER — SODIUM CHLORIDE 0.9 % IV BOLUS (SEPSIS)
500.0000 mL | Freq: Once | INTRAVENOUS | Status: AC
  Administered 2013-10-28: 500 mL via INTRAVENOUS

## 2013-10-28 MED ORDER — ACETAMINOPHEN 325 MG PO TABS
650.0000 mg | ORAL_TABLET | Freq: Once | ORAL | Status: AC
  Administered 2013-10-28: 650 mg via ORAL
  Filled 2013-10-28: qty 2

## 2013-10-28 MED ORDER — SODIUM CHLORIDE 0.9 % IV BOLUS (SEPSIS): 1000.0000 mL | Freq: Once | INTRAVENOUS | Status: DC

## 2013-10-28 NOTE — ED Notes (Signed)
Pt request to hold off on the ecg til she speak with the edp.  Pts states she only need labs drawn.  Notified nurse

## 2013-10-28 NOTE — ED Provider Notes (Signed)
CSN: [id number]     Arrival date & time 10/28/13  1949 History   First MD Initiated Contact with Patient 10/28/13 2131     Chief Complaint  Patient presents with  . Fever     (Consider location/radiation/quality/duration/timing/severity/associated sxs/prior Treatment) The history is provided by the patient. No language interpreter was used.  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 Patient has right liver lobe resection in March 2015. Patient currently on chemotherapy and radiation - last radiation last Monday and chemo this past Tuesday. Patient reported that she has been having fever that started the other day. Stated that her fever has been as high as 100.60F today - stated that she has not taken anything for the fever control. Patient reported that she has been feeling fatigued. Reported that the last time she was seen in the ED her blood cultures came back positive for gram negative rods - stated that she was admitted to [facility] the next day where she was placed on IV Ciprofloxacin. Reported that she was told by her Oncologist that if she does have fever again she is to report back to the ED to get labs and blood cultures performed. Patient reported that she spoke with her Oncologist today who recommended patient to come to the ED to get lad work performed. Reported that she has been having ongoing abdominal pain since she was diagnosed with cancer - stated that she her abdomen normally feels hot. Reported that she has been feeling nauseous. Denied vomiting, diarrhea, melena hematochezia, dysuria, hematuria, sick contacts, travel, chest pain, shortness of breath, difficulty breathing, dizziness, headache, blurred vision, sudden loss of vision. PCP Dr. Leonides Schanz Oncologist Dr. Ileene Rubens at [facility] Radiology Oncologist Dr. Raylene Everts  Past Medical  History  Diagnosis Date  . Anxiety   . Elevated liver enzymes 03-26-13  . Jaundice 03-26-13    Jaundice -presently skin and sclera  . Cancer   . DRESS syndrome    Past Surgical History  Procedure Laterality Date  . Knee arthroscopy Bilateral     both knees  . Eus N/A 04/03/2013    Procedure: ESOPHAGEAL ENDOSCOPIC ULTRASOUND (EUS) RADIAL;  Surgeon: Arta Silence, MD;  Location: [facility];  Service: Endoscopy;  Laterality: N/A;  bx of liver lesion  . Liver resection     Family History  Problem Relation Age of Onset  . Hypertension Mother   . Hypertension Father    History  Substance Use Topics  . Smoking status: Never Smoker   . Smokeless tobacco: Not on file  . Alcohol Use: No   OB History   Grav Para Term Preterm Abortions TAB SAB Ect Mult Living                 Review of Systems  Constitutional: Positive for fever, chills and fatigue.  HENT: Negative for congestion.   Respiratory: Negative for cough, chest tightness and shortness of breath.   Cardiovascular: Negative for chest pain.  Gastrointestinal: Positive for nausea and abdominal pain (chronic). Negative for vomiting, diarrhea, constipation, blood in stool and anal bleeding.  Musculoskeletal: Negative for back pain and neck pain.  Neurological: Negative for headaches.      Allergies  Rocephin; Vancomycin; Zosyn; Amoxicillin; and Sulfa antibiotics  Home Medications   Prior to Admission medications   Medication Sig Start Date End Date Taking? Authorizing  Provider  furosemide (LASIX) 20 MG tablet Take 20 mg by mouth daily.   Yes Historical Provider, MD  metroNIDAZOLE (FLAGYL) 500 MG tablet Take 500 mg by mouth every 8 (eight) hours. 21 day therapy course patient has 4 day remaining (C. Diff) 10/10/13  Yes Historical Provider, MD  ondansetron (ZOFRAN-ODT) 8 MG disintegrating tablet Take 8 mg by mouth every 8 (eight) hours as needed for nausea or vomiting.   Yes Historical Provider, MD  Probiotic Product (ALIGN)  4 MG CAPS Take 4 mg by mouth daily.   Yes Historical Provider, MD  sertraline (ZOLOFT) 25 MG tablet Take 25 mg by mouth every morning.   Yes Historical Provider, MD  spironolactone (ALDACTONE) 50 MG tablet Take 50 mg by mouth daily. 10/14/13 10/14/14 Yes Historical Provider, MD   BP 109/59  Pulse 90  Temp(Src) 99.4 F (37.4 C) (Oral)  Resp 14  SpO2 100% Physical Exam  Vitals reviewed. Constitutional: She is oriented to person, place, and time. She appears well-developed and well-nourished. No distress.  HENT:  Head: Normocephalic and atraumatic.  Mouth/Throat: Oropharynx is clear and moist. No oropharyngeal exudate.  Eyes: Conjunctivae and EOM are normal. Pupils are equal, round, and reactive to light. Right eye exhibits no discharge. Left eye exhibits no discharge.  Neck: Normal range of motion. Neck supple. No tracheal deviation present.  Negative neck stiffness Negative nuchal rigidity  Negative cervical lymphadenopathy  Negative meningeal signs   Cardiovascular: Regular rhythm and normal heart sounds.  Tachycardia present.   Pulses:      Radial pulses are 2+ on the right side, and 2+ on the left side.       Dorsalis pedis pulses are 2+ on the right side, and 2+ on the left side.  Pulmonary/Chest: Effort normal. No respiratory distress. She has no wheezes. She has no rales.  Abdominal: Soft. Normal appearance and bowel sounds are normal. She exhibits no distension. There is generalized tenderness. There is no rebound and no guarding.    Negative abdominal distension noted Scar that is healed over noted to the RUQ where liver resection surgery was performed  Abdomen hot to the touch - chronic finding as per patient Discomfort upon palpation - generalized Negative peritoneal signs  Musculoskeletal: Normal range of motion.  Full ROM to upper and lower extremities without difficulty noted, negative ataxia noted.  Lymphadenopathy:    She has no cervical adenopathy.  Neurological:  She is alert and oriented to person, place, and time. No cranial nerve deficit. She exhibits normal muscle tone. Coordination normal.  Skin: Skin is dry. No rash noted. She is not diaphoretic. No erythema.  Psychiatric: She has a normal mood and affect. Her behavior is normal. Thought content normal.    ED Course  Procedures (including critical care time)  12:06 PM Patient seen and assessed by attending physician, Dr. Jerilynn Mages. Colin Rhein - [place] at bedside performed with negative findings of fluid.   12:19 AM This provider spoke with Dr. Reynaldo Minium, on-call Oncology physician - discussed case, labs, imaging, vitals, and ED course as well as history with physician. As per physician recommended patient to be transferred to [facility]. No recommendations for antibiotics given.   Results for orders placed during the hospital encounter of 10/28/13  CBC WITH DIFFERENTIAL      Result Value Ref Range   WBC 3.6 (*) 4.0 - 10.5 K/uL   RBC 3.76 (*) 3.87 - 5.11 MIL/uL   Hemoglobin 10.1 (*) 12.0 - 15.0 g/dL   HCT 29.8 (*)  36.0 - 46.0 %   MCV 79.3  78.0 - 100.0 fL   MCH 26.9  26.0 - 34.0 pg   MCHC 33.9  30.0 - 36.0 g/dL   RDW 21.6 (*) 11.5 - 15.5 %   Platelets 137 (*) 150 - 400 K/uL   Neutrophils Relative % 83 (*) 43 - 77 %   Lymphocytes Relative 3 (*) 12 - 46 %   Monocytes Relative 13 (*) 3 - 12 %   Eosinophils Relative 1  0 - 5 %   Basophils Relative 0  0 - 1 %   Neutro Abs 3.0  1.7 - 7.7 K/uL   Lymphs Abs 0.1 (*) 0.7 - 4.0 K/uL   Monocytes Absolute 0.5  0.1 - 1.0 K/uL   Eosinophils Absolute 0.0  0.0 - 0.7 K/uL   Basophils Absolute 0.0  0.0 - 0.1 K/uL   Smear Review MORPHOLOGY UNREMARKABLE    COMPREHENSIVE METABOLIC PANEL      Result Value Ref Range   Sodium 134 (*) 137 - 147 mEq/L   Potassium 5.2  3.7 - 5.3 mEq/L   Chloride 101  96 - 112 mEq/L   CO2 21  19 - 32 mEq/L   Glucose, Bld 93  70 - 99 mg/dL   BUN 21  6 - 23 mg/dL   Creatinine, Ser 0.81  0.50 - 1.10 mg/dL   Calcium 9.0  8.4 - 10.5 mg/dL   Total  Protein 7.8  6.0 - 8.3 g/dL   Albumin 2.8 (*) 3.5 - 5.2 g/dL   AST 262 (*) 0 - 37 U/L   ALT 110 (*) 0 - 35 U/L   Alkaline Phosphatase 1390 (*) 39 - 117 U/L   Total Bilirubin 1.2  0.3 - 1.2 mg/dL   GFR calc non Af Amer 77 (*) >90 mL/min   GFR calc Af Amer 90 (*) >90 mL/min   Anion gap 12  5 - 15  LIPASE, BLOOD      Result Value Ref Range   Lipase 32  11 - 59 U/L  URINALYSIS, ROUTINE W REFLEX MICROSCOPIC      Result Value Ref Range   Color, Urine YELLOW  YELLOW   APPearance CLEAR  CLEAR   Specific Gravity, Urine 1.020  1.005 - 1.030   pH 8.0  5.0 - 8.0   Glucose, UA NEGATIVE  NEGATIVE mg/dL   Hgb urine dipstick NEGATIVE  NEGATIVE   Bilirubin Urine NEGATIVE  NEGATIVE   Ketones, ur NEGATIVE  NEGATIVE mg/dL   Protein, ur NEGATIVE  NEGATIVE mg/dL   Urobilinogen, UA 0.2  0.0 - 1.0 mg/dL   Nitrite NEGATIVE  NEGATIVE   Leukocytes, UA NEGATIVE  NEGATIVE  TROPONIN I      Result Value Ref Range   Troponin I <0.30  <0.30 ng/mL  I-STAT CG4 LACTIC ACID, ED      Result Value Ref Range   Lactic Acid, Venous 0.85  0.5 - 2.2 mmol/L     Results for orders placed during the hospital encounter of 10/28/13  CBC WITH DIFFERENTIAL      Result Value Ref Range   WBC 3.6 (*) 4.0 - 10.5 K/uL   RBC 3.76 (*) 3.87 - 5.11 MIL/uL   Hemoglobin 10.1 (*) 12.0 - 15.0 g/dL   HCT 29.8 (*) 36.0 - 46.0 %   MCV 79.3  78.0 - 100.0 fL   MCH 26.9  26.0 - 34.0 pg   MCHC 33.9  30.0 - 36.0 g/dL  RDW 21.6 (*) 11.5 - 15.5 %   Platelets 137 (*) 150 - 400 K/uL   Neutrophils Relative % 83 (*) 43 - 77 %   Lymphocytes Relative 3 (*) 12 - 46 %   Monocytes Relative 13 (*) 3 - 12 %   Eosinophils Relative 1  0 - 5 %   Basophils Relative 0  0 - 1 %   Neutro Abs 3.0  1.7 - 7.7 K/uL   Lymphs Abs 0.1 (*) 0.7 - 4.0 K/uL   Monocytes Absolute 0.5  0.1 - 1.0 K/uL   Eosinophils Absolute 0.0  0.0 - 0.7 K/uL   Basophils Absolute 0.0  0.0 - 0.1 K/uL   Smear Review MORPHOLOGY UNREMARKABLE    COMPREHENSIVE METABOLIC PANEL       Result Value Ref Range   Sodium 134 (*) 137 - 147 mEq/L   Potassium 5.2  3.7 - 5.3 mEq/L   Chloride 101  96 - 112 mEq/L   CO2 21  19 - 32 mEq/L   Glucose, Bld 93  70 - 99 mg/dL   BUN 21  6 - 23 mg/dL   Creatinine, Ser 0.81  0.50 - 1.10 mg/dL   Calcium 9.0  8.4 - 10.5 mg/dL   Total Protein 7.8  6.0 - 8.3 g/dL   Albumin 2.8 (*) 3.5 - 5.2 g/dL   AST 262 (*) 0 - 37 U/L   ALT 110 (*) 0 - 35 U/L   Alkaline Phosphatase 1390 (*) 39 - 117 U/L   Total Bilirubin 1.2  0.3 - 1.2 mg/dL   GFR calc non Af Amer 77 (*) >90 mL/min   GFR calc Af Amer 90 (*) >90 mL/min   Anion gap 12  5 - 15  LIPASE, BLOOD      Result Value Ref Range   Lipase 32  11 - 59 U/L  URINALYSIS, ROUTINE W REFLEX MICROSCOPIC      Result Value Ref Range   Color, Urine YELLOW  YELLOW   APPearance CLEAR  CLEAR   Specific Gravity, Urine 1.020  1.005 - 1.030   pH 8.0  5.0 - 8.0   Glucose, UA NEGATIVE  NEGATIVE mg/dL   Hgb urine dipstick NEGATIVE  NEGATIVE   Bilirubin Urine NEGATIVE  NEGATIVE   Ketones, ur NEGATIVE  NEGATIVE mg/dL   Protein, ur NEGATIVE  NEGATIVE mg/dL   Urobilinogen, UA 0.2  0.0 - 1.0 mg/dL   Nitrite NEGATIVE  NEGATIVE   Leukocytes, UA NEGATIVE  NEGATIVE  TROPONIN I      Result Value Ref Range   Troponin I <0.30  <0.30 ng/mL  I-STAT CG4 LACTIC ACID, ED      Result Value Ref Range   Lactic Acid, Venous 0.85  0.5 - 2.2 mmol/L    Labs Review Labs Reviewed  CBC WITH DIFFERENTIAL - Abnormal; Notable for the following:    WBC 3.6 (*)    RBC 3.76 (*)    Hemoglobin 10.1 (*)    HCT 29.8 (*)    RDW 21.6 (*)    Platelets 137 (*)    Neutrophils Relative % 83 (*)    Lymphocytes Relative 3 (*)    Monocytes Relative 13 (*)    Lymphs Abs 0.1 (*)    All other components within normal limits  COMPREHENSIVE METABOLIC PANEL - Abnormal; Notable for the following:    Sodium 134 (*)    Albumin 2.8 (*)    AST 262 (*)    ALT 110 (*)  Alkaline Phosphatase 1390 (*)    GFR calc non Af Amer 77 (*)    GFR calc Af  Amer 90 (*)    All other components within normal limits  CULTURE, BLOOD (ROUTINE X 2)  CULTURE, BLOOD (ROUTINE X 2)  LIPASE, BLOOD  URINALYSIS, ROUTINE W REFLEX MICROSCOPIC  TROPONIN I  I-STAT CG4 LACTIC ACID, ED    Imaging Review Dg Chest Port 1 View  10/28/2013   CLINICAL DATA:  Fever. History of cholangiocarcinoma currently on chemotherapy.  EXAM: PORTABLE CHEST - 1 VIEW  COMPARISON:  10/06/2013  FINDINGS: Slightly shallow inspiration. The heart size and mediastinal contours are within normal limits. Both lungs are clear. The visualized skeletal structures are unremarkable.  IMPRESSION: No active disease.   Electronically Signed   By: Lucienne Capers M.D.   On: 10/28/2013 21:56     EKG Interpretation None      MDM   Final diagnoses:  Fever, unspecified fever cause  Cholangiocarcinoma  Other fatigue    Medications  sodium chloride 0.9 % bolus 500 mL (0 mLs Intravenous Stopped 10/28/13 2350)  acetaminophen (TYLENOL) tablet 650 mg (650 mg Oral Given 10/28/13 2351)  sodium chloride 0.9 % bolus 500 mL (500 mLs Intravenous New Bag/Given 10/29/13 0256)   Filed Vitals:   10/28/13 2016 10/28/13 2353 10/29/13 0302  BP: 134/80 135/72 109/59  Pulse: 101 93 90  Temp: 99.4 F (37.4 C)    TempSrc: Oral    Resp: 14 18 14   SpO2: 99% 100% 100%   This provider reviewed the patient's chart. Patient was last seen and assessed in the ED setting on 10/06/2013 regarding fever where a sepsis work-up was performed. Patient had blood transfusion at this time. Blood cultures were obtained that grew Klebsiella. During stay in emergency department on call oncologist at [facility] was consulted who recommended patient to be discharged home. EKG - patient refused stating that she is not here for EKG - it is not her heart, only blood work. Troponin negative elevation. CBC noted neutropenia. Hemoglobin 10.1, hematocrit 29.8. CMP noted elevated AST of 262, ALT of 110, alkaline phosphatase elevated at  1390. Mildly low sodium of 134. Kidney function well. Negative elevated bicarbonate. Negative hypokalemia hypocalcemia noted. Lactic acid negative elevation. Urinalysis negative for infection-negative nitrites or leukocytes identified. Blood culture pending. Chest x-ray negative for acute cardiac pulmonary disease. Patient presenting to the ED with fever-patient neutropenic. Patient has history of blood cultures consisting of Klebsiella. This provider spoke with on call oncologist at [facility], Dr. Wayland Salinas per oncologist recommendations, recommended patient to be transferred to [facility] at this time for further workup and treatment - no recommendations for antibiotics given. Discussed plan for transfer the patient agrees to plan of care. Patient stable for transfer.  Jamse Mead, PA-C 10/29/13 0315  Jamse Mead, PA-C 10/29/13 1805

## 2013-10-28 NOTE — ED Notes (Signed)
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

## 2013-10-29 LAB — URINALYSIS, ROUTINE W REFLEX MICROSCOPIC
Bilirubin Urine: NEGATIVE
GLUCOSE, UA: NEGATIVE mg/dL
Hgb urine dipstick: NEGATIVE
KETONES UR: NEGATIVE mg/dL
Leukocytes, UA: NEGATIVE
Nitrite: NEGATIVE
PH: 8 (ref 5.0–8.0)
Protein, ur: NEGATIVE mg/dL
Specific Gravity, Urine: 1.02 (ref 1.005–1.030)
Urobilinogen, UA: 0.2 mg/dL (ref 0.0–1.0)

## 2013-10-29 MED ORDER — SODIUM CHLORIDE 0.9 % IV BOLUS (SEPSIS)
500.0000 mL | Freq: Once | INTRAVENOUS | Status: AC
  Administered 2013-10-29: 500 mL via INTRAVENOUS

## 2013-10-29 NOTE — ED Notes (Signed)
[facility] was notified of pt's transfer to [facility].

## 2013-10-29 NOTE — ED Notes (Signed)
Ecologist ([facility]) here to transfer pt to [facility].

## 2013-10-30 ENCOUNTER — Telehealth (HOSPITAL_BASED_OUTPATIENT_CLINIC_OR_DEPARTMENT_OTHER): Payer: Self-pay | Admitting: Emergency Medicine

## 2013-10-30 NOTE — Telephone Encounter (Signed)
Post ED Visit - Positive Culture Follow-up: Successful Patient Follow-Up  Culture assessed and recommendations reviewed by: []  Wes [place], Pharm.D., BCPS []  Heide Guile, Pharm.D., BCPS []  Alycia Rossetti, Pharm.D., BCPS []  [place], Pharm.D., BCPS, AAHIVP []  Legrand Como, Pharm.D., BCPS, AAHIVP []  Hassie Bruce, Pharm.D. []  Milus Glazier, Pharm.D.  Positive blood culture  Aerobic x 1, cocci in pairs and chains  [x]  Patient discharged without antimicrobial prescription and treatment is now indicated []  Organism is resistant to prescribed ED discharge antimicrobial []  Patient with positive blood cultures  Changes discussed with ED provider: sent to EDP 10/30/13 New antibiotic prescription  Called to   [facility] patient, date   , time    Hazle Nordmann 10/30/2013, 11:05 AM

## 2013-10-30 NOTE — Telephone Encounter (Addendum)
Post ED Visit - Positive Culture Follow-up  Culture report reviewed by antimicrobial stewardship pharmacist: []  Wes [place], Pharm.D., BCPS []  Heide Guile, Pharm.D., BCPS []  Alycia Rossetti, Pharm.D., BCPS []  [place], [place].D., BCPS, AAHIVP []  Legrand Como, Pharm.D., BCPS, AAHIVP []  Carly Sabat, Pharm.D. []  Elenor Quinones, Pharm.D.  Positive blood culture x 1  further patient follow-up is required at this time., most likely contaminant , will await ID of organism per Dr. Tomi Bamberger , results faxed to [place]  Hazle Nordmann 10/30/2013, 1:56 PM

## 2013-10-31 LAB — CULTURE, BLOOD (ROUTINE X 2)

## 2013-10-31 NOTE — ED Provider Notes (Signed)
Medical screening examination/treatment/procedure(s) were performed by non-physician practitioner and as supervising physician I was immediately available for consultation/collaboration.   EKG Interpretation None        Debby Freiberg, MD 10/31/13 1247

## 2013-11-02 ENCOUNTER — Telehealth (HOSPITAL_COMMUNITY): Payer: Self-pay | Admitting: *Deleted

## 2013-11-02 NOTE — ED Notes (Signed)
(+)  blood culture; faxed to [facility] [phone])

## 2013-11-04 LAB — CULTURE, BLOOD (ROUTINE X 2): CULTURE: NO GROWTH

## 2013-12-24 ENCOUNTER — Other Ambulatory Visit: Payer: Self-pay | Admitting: Family Medicine

## 2014-02-06 ENCOUNTER — Emergency Department (HOSPITAL_COMMUNITY): Payer: BC Managed Care – PPO

## 2014-02-06 ENCOUNTER — Inpatient Hospital Stay (HOSPITAL_COMMUNITY)
Admission: EM | Admit: 2014-02-06 | Discharge: 2014-02-12 | DRG: 377 | Disposition: A | Discharge status: Home / Self Care | Payer: BC Managed Care – PPO | Attending: Internal Medicine | Admitting: Internal Medicine

## 2014-02-06 ENCOUNTER — Encounter (HOSPITAL_COMMUNITY): Payer: Self-pay

## 2014-02-06 DIAGNOSIS — Z881 Allergy status to other antibiotic agents status: Secondary | ICD-10-CM

## 2014-02-06 DIAGNOSIS — K920 Hematemesis: Secondary | ICD-10-CM | POA: Diagnosis present

## 2014-02-06 DIAGNOSIS — Y842 Radiological procedure and radiotherapy as the cause of abnormal reaction of the patient, or of later complication, without mention of misadventure at the time of the procedure: Secondary | ICD-10-CM | POA: Diagnosis present

## 2014-02-06 DIAGNOSIS — D649 Anemia, unspecified: Secondary | ICD-10-CM | POA: Diagnosis present

## 2014-02-06 DIAGNOSIS — E860 Dehydration: Secondary | ICD-10-CM | POA: Diagnosis present

## 2014-02-06 DIAGNOSIS — K7682 Hepatic encephalopathy: Secondary | ICD-10-CM | POA: Diagnosis present

## 2014-02-06 DIAGNOSIS — Z9049 Acquired absence of other specified parts of digestive tract: Secondary | ICD-10-CM | POA: Diagnosis present

## 2014-02-06 DIAGNOSIS — K567 Ileus, unspecified: Secondary | ICD-10-CM | POA: Diagnosis present

## 2014-02-06 DIAGNOSIS — D759 Disease of blood and blood-forming organs, unspecified: Secondary | ICD-10-CM | POA: Diagnosis present

## 2014-02-06 DIAGNOSIS — K2990 Gastroduodenitis, unspecified, without bleeding: Secondary | ICD-10-CM | POA: Diagnosis present

## 2014-02-06 DIAGNOSIS — K922 Gastrointestinal hemorrhage, unspecified: Secondary | ICD-10-CM | POA: Diagnosis present

## 2014-02-06 DIAGNOSIS — T451X5A Adverse effect of antineoplastic and immunosuppressive drugs, initial encounter: Secondary | ICD-10-CM | POA: Diagnosis present

## 2014-02-06 DIAGNOSIS — D638 Anemia in other chronic diseases classified elsewhere: Secondary | ICD-10-CM | POA: Diagnosis present

## 2014-02-06 DIAGNOSIS — N179 Acute kidney failure, unspecified: Secondary | ICD-10-CM | POA: Diagnosis present

## 2014-02-06 DIAGNOSIS — R188 Other ascites: Secondary | ICD-10-CM | POA: Diagnosis present

## 2014-02-06 DIAGNOSIS — F419 Anxiety disorder, unspecified: Secondary | ICD-10-CM | POA: Diagnosis present

## 2014-02-06 DIAGNOSIS — D62 Acute posthemorrhagic anemia: Secondary | ICD-10-CM | POA: Diagnosis present

## 2014-02-06 DIAGNOSIS — Z923 Personal history of irradiation: Secondary | ICD-10-CM | POA: Diagnosis not present

## 2014-02-06 DIAGNOSIS — C221 Intrahepatic bile duct carcinoma: Secondary | ICD-10-CM | POA: Diagnosis present

## 2014-02-06 DIAGNOSIS — R1110 Vomiting, unspecified: Secondary | ICD-10-CM | POA: Diagnosis present

## 2014-02-06 DIAGNOSIS — D61810 Antineoplastic chemotherapy induced pancytopenia: Secondary | ICD-10-CM | POA: Diagnosis present

## 2014-02-06 DIAGNOSIS — D61818 Other pancytopenia: Secondary | ICD-10-CM | POA: Diagnosis present

## 2014-02-06 DIAGNOSIS — R7989 Other specified abnormal findings of blood chemistry: Secondary | ICD-10-CM | POA: Diagnosis present

## 2014-02-06 DIAGNOSIS — Z8505 Personal history of malignant neoplasm of liver: Secondary | ICD-10-CM | POA: Diagnosis not present

## 2014-02-06 DIAGNOSIS — K7460 Unspecified cirrhosis of liver: Secondary | ICD-10-CM | POA: Diagnosis present

## 2014-02-06 DIAGNOSIS — K7200 Acute and subacute hepatic failure without coma: Secondary | ICD-10-CM | POA: Diagnosis present

## 2014-02-06 DIAGNOSIS — K5660 Unspecified intestinal obstruction: Secondary | ICD-10-CM | POA: Diagnosis present

## 2014-02-06 DIAGNOSIS — Z888 Allergy status to other drugs, medicaments and biological substances status: Secondary | ICD-10-CM

## 2014-02-06 DIAGNOSIS — Z882 Allergy status to sulfonamides status: Secondary | ICD-10-CM | POA: Diagnosis not present

## 2014-02-06 DIAGNOSIS — E876 Hypokalemia: Secondary | ICD-10-CM | POA: Diagnosis present

## 2014-02-06 DIAGNOSIS — K7290 Hepatic failure, unspecified without coma: Secondary | ICD-10-CM | POA: Diagnosis present

## 2014-02-06 DIAGNOSIS — R945 Abnormal results of liver function studies: Secondary | ICD-10-CM | POA: Diagnosis present

## 2014-02-06 DIAGNOSIS — G9340 Encephalopathy, unspecified: Secondary | ICD-10-CM

## 2014-02-06 DIAGNOSIS — Z9221 Personal history of antineoplastic chemotherapy: Secondary | ICD-10-CM

## 2014-02-06 HISTORY — DX: Hepatic encephalopathy: K76.82

## 2014-02-06 HISTORY — DX: Hepatic failure, unspecified without coma: K72.90

## 2014-02-06 LAB — CBC WITH DIFFERENTIAL/PLATELET
Basophils Absolute: 0 10*3/uL (ref 0.0–0.1)
Basophils Absolute: 0 10*3/uL (ref 0.0–0.1)
Basophils Relative: 0 % (ref 0–1)
Basophils Relative: 0 % (ref 0–1)
EOS ABS: 0.1 10*3/uL (ref 0.0–0.7)
EOS PCT: 2 % (ref 0–5)
Eosinophils Absolute: 0 10*3/uL (ref 0.0–0.7)
Eosinophils Relative: 1 % (ref 0–5)
HEMATOCRIT: 20.2 % — AB (ref 36.0–46.0)
HEMATOCRIT: 18.9 % — AB (ref 36.0–46.0)
HEMOGLOBIN: 5.9 g/dL — AB (ref 12.0–15.0)
Hemoglobin: 6.3 g/dL — CL (ref 12.0–15.0)
LYMPHS ABS: 0.6 10*3/uL — AB (ref 0.7–4.0)
LYMPHS ABS: 0.5 10*3/uL — AB (ref 0.7–4.0)
LYMPHS PCT: 19 % (ref 12–46)
Lymphocytes Relative: 16 % (ref 12–46)
MCH: 28 pg (ref 26.0–34.0)
MCH: 28 pg (ref 26.0–34.0)
MCHC: 31.2 g/dL (ref 30.0–36.0)
MCHC: 31.2 g/dL (ref 30.0–36.0)
MCV: 89.8 fL (ref 78.0–100.0)
MCV: 89.6 fL (ref 78.0–100.0)
MONO ABS: 0.4 10*3/uL (ref 0.1–1.0)
MONOS PCT: 10 % (ref 3–12)
MONOS PCT: 9 % (ref 3–12)
Monocytes Absolute: 0.3 10*3/uL (ref 0.1–1.0)
Neutro Abs: 2.6 10*3/uL (ref 1.7–7.7)
Neutro Abs: 1.9 10*3/uL (ref 1.7–7.7)
Neutrophils Relative %: 72 % (ref 43–77)
Neutrophils Relative %: 70 % (ref 43–77)
PLATELETS: 134 10*3/uL — AB (ref 150–400)
Platelets: 124 10*3/uL — ABNORMAL LOW (ref 150–400)
RBC: 2.25 MIL/uL — AB (ref 3.87–5.11)
RBC: 2.11 MIL/uL — ABNORMAL LOW (ref 3.87–5.11)
RDW: 16.1 % — ABNORMAL HIGH (ref 11.5–15.5)
RDW: 16.2 % — ABNORMAL HIGH (ref 11.5–15.5)
WBC: 3.6 10*3/uL — ABNORMAL LOW (ref 4.0–10.5)
WBC: 2.7 10*3/uL — AB (ref 4.0–10.5)

## 2014-02-06 LAB — COMPREHENSIVE METABOLIC PANEL
ALK PHOS: 682 U/L — AB (ref 39–117)
ALT: 115 U/L — ABNORMAL HIGH (ref 0–35)
ALT: 104 U/L — AB (ref 0–35)
ANION GAP: 13 (ref 5–15)
AST: 161 U/L — AB (ref 0–37)
AST: 136 U/L — ABNORMAL HIGH (ref 0–37)
Albumin: 1.9 g/dL — ABNORMAL LOW (ref 3.5–5.2)
Albumin: 1.8 g/dL — ABNORMAL LOW (ref 3.5–5.2)
Alkaline Phosphatase: 757 U/L — ABNORMAL HIGH (ref 39–117)
Anion gap: 12 (ref 5–15)
BILIRUBIN TOTAL: 1.6 mg/dL — AB (ref 0.3–1.2)
BILIRUBIN TOTAL: 1.3 mg/dL — AB (ref 0.3–1.2)
BUN: 31 mg/dL — ABNORMAL HIGH (ref 6–23)
BUN: 31 mg/dL — ABNORMAL HIGH (ref 6–23)
CALCIUM: 8.5 mg/dL (ref 8.4–10.5)
CO2: 21 meq/L (ref 19–32)
CO2: 20 mEq/L (ref 19–32)
CREATININE: 1.23 mg/dL — AB (ref 0.50–1.10)
Calcium: 8.1 mg/dL — ABNORMAL LOW (ref 8.4–10.5)
Chloride: 105 mEq/L (ref 96–112)
Chloride: 110 mEq/L (ref 96–112)
Creatinine, Ser: 1.13 mg/dL — ABNORMAL HIGH (ref 0.50–1.10)
GFR calc Af Amer: 54 mL/min — ABNORMAL LOW (ref >90)
GFR calc Af Amer: 60 mL/min — ABNORMAL LOW (ref >90)
GFR calc non Af Amer: 52 mL/min — ABNORMAL LOW (ref >90)
GFR, EST NON AFRICAN AMERICAN: 47 mL/min — AB (ref >90)
Glucose, Bld: 122 mg/dL — ABNORMAL HIGH (ref 70–99)
Glucose, Bld: 117 mg/dL — ABNORMAL HIGH (ref 70–99)
Potassium: 4.1 mEq/L (ref 3.7–5.3)
Potassium: 4.1 mEq/L (ref 3.7–5.3)
SODIUM: 142 meq/L (ref 137–147)
Sodium: 139 mEq/L (ref 137–147)
TOTAL PROTEIN: 5.5 g/dL — AB (ref 6.0–8.3)
Total Protein: 6 g/dL (ref 6.0–8.3)

## 2014-02-06 LAB — I-STAT CHEM 8, ED
BUN: 28 mg/dL — AB (ref 6–23)
CALCIUM ION: 1.12 mmol/L — AB (ref 1.13–1.30)
Chloride: 112 mEq/L (ref 96–112)
Creatinine, Ser: 1.1 mg/dL (ref 0.50–1.10)
GLUCOSE: 116 mg/dL — AB (ref 70–99)
HCT: 19 % — ABNORMAL LOW (ref 36.0–46.0)
HEMOGLOBIN: 6.5 g/dL — AB (ref 12.0–15.0)
Potassium: 4 mEq/L (ref 3.7–5.3)
SODIUM: 142 meq/L (ref 137–147)
TCO2: 18 mmol/L (ref 0–100)

## 2014-02-06 LAB — URINALYSIS, ROUTINE W REFLEX MICROSCOPIC
Bilirubin Urine: NEGATIVE
Glucose, UA: NEGATIVE mg/dL
Hgb urine dipstick: NEGATIVE
KETONES UR: NEGATIVE mg/dL
LEUKOCYTES UA: NEGATIVE
NITRITE: NEGATIVE
PROTEIN: NEGATIVE mg/dL
Specific Gravity, Urine: 1.018 (ref 1.005–1.030)
UROBILINOGEN UA: 1 mg/dL (ref 0.0–1.0)
pH: 5.5 (ref 5.0–8.0)

## 2014-02-06 LAB — PHOSPHORUS: Phosphorus: 3.2 mg/dL (ref 2.3–4.6)

## 2014-02-06 LAB — I-STAT CG4 LACTIC ACID, ED: LACTIC ACID, VENOUS: 1.5 mmol/L (ref 0.5–2.2)

## 2014-02-06 LAB — APTT: APTT: 21 s — AB (ref 24–37)

## 2014-02-06 LAB — PROTIME-INR
INR: 1.1 (ref 0.00–1.49)
INR: 1.04 (ref 0.00–1.49)
Prothrombin Time: 14.3 seconds (ref 11.6–15.2)
Prothrombin Time: 13.8 seconds (ref 11.6–15.2)

## 2014-02-06 LAB — PREPARE RBC (CROSSMATCH)

## 2014-02-06 LAB — MRSA PCR SCREENING: MRSA BY PCR: NEGATIVE

## 2014-02-06 LAB — AMMONIA: Ammonia: 148 umol/L — ABNORMAL HIGH (ref 11–60)

## 2014-02-06 LAB — MAGNESIUM: Magnesium: 2.1 mg/dL (ref 1.5–2.5)

## 2014-02-06 MED ORDER — ONDANSETRON HCL 4 MG PO TABS: 4.0000 mg | ORAL_TABLET | Freq: Four times a day (QID) | ORAL | Status: DC | PRN

## 2014-02-06 MED ORDER — PANTOPRAZOLE SODIUM 40 MG IV SOLR
40.0000 mg | INTRAVENOUS | Status: DC
  Administered 2014-02-07: 40 mg via INTRAVENOUS
  Filled 2014-02-06: qty 40

## 2014-02-06 MED ORDER — ACETAMINOPHEN 325 MG PO TABS: 650.0000 mg | ORAL_TABLET | Freq: Four times a day (QID) | ORAL | Status: DC | PRN

## 2014-02-06 MED ORDER — ACETAMINOPHEN 650 MG RE SUPP: 650.0000 mg | Freq: Four times a day (QID) | RECTAL | Status: DC | PRN

## 2014-02-06 MED ORDER — SODIUM CHLORIDE 0.9 % IJ SOLN
3.0000 mL | Freq: Two times a day (BID) | INTRAMUSCULAR | Status: DC
  Administered 2014-02-06 – 2014-02-12 (×10): 3 mL via INTRAVENOUS

## 2014-02-06 MED ORDER — SODIUM CHLORIDE 0.9 % IV BOLUS (SEPSIS)
500.0000 mL | Freq: Once | INTRAVENOUS | Status: AC
  Administered 2014-02-06: 500 mL via INTRAVENOUS

## 2014-02-06 MED ORDER — LACTULOSE 10 GM/15ML PO SOLN
30.0000 g | Freq: Two times a day (BID) | ORAL | Status: DC
  Filled 2014-02-06: qty 45

## 2014-02-06 MED ORDER — NADOLOL 20 MG PO TABS
20.0000 mg | ORAL_TABLET | Freq: Every day | ORAL | Status: DC
  Filled 2014-02-06: qty 1

## 2014-02-06 MED ORDER — ONDANSETRON HCL 4 MG/2ML IJ SOLN: 4.0000 mg | Freq: Four times a day (QID) | INTRAMUSCULAR | Status: DC | PRN

## 2014-02-06 MED ORDER — LACTULOSE 10 GM/15ML PO SOLN
30.0000 g | Freq: Once | ORAL | Status: AC
  Administered 2014-02-06: 30 g
  Filled 2014-02-06 (×2): qty 45

## 2014-02-06 MED ORDER — SERTRALINE HCL 50 MG PO TABS: 25.0000 mg | ORAL_TABLET | Freq: Every morning | ORAL | Status: DC

## 2014-02-06 MED ORDER — SODIUM CHLORIDE 0.9 % IV SOLN
INTRAVENOUS | Status: AC
  Administered 2014-02-06 – 2014-02-07 (×2): via INTRAVENOUS

## 2014-02-06 MED ORDER — SODIUM CHLORIDE 0.9 % IV SOLN: 10.0000 mL/h | Freq: Once | INTRAVENOUS | Status: DC

## 2014-02-06 MED ORDER — SPIRONOLACTONE 25 MG PO TABS: 50.0000 mg | ORAL_TABLET | Freq: Every day | ORAL | Status: DC

## 2014-02-06 MED ORDER — SUCRALFATE 1 GM/10ML PO SUSP: 1.0000 g | Freq: Three times a day (TID) | ORAL | Status: DC

## 2014-02-06 MED ORDER — ONDANSETRON HCL 4 MG/2ML IJ SOLN
4.0000 mg | Freq: Once | INTRAMUSCULAR | Status: AC
  Administered 2014-02-06: 4 mg via INTRAVENOUS
  Filled 2014-02-06: qty 2

## 2014-02-06 NOTE — ED Notes (Signed)
MD at bedside. Posey Pronto

## 2014-02-06 NOTE — Progress Notes (Signed)
Patient ID: Joann Castaneda, female   DOB: 06-11-1955, 58 y.o.   MRN: [id number] [facility] History and Physical  Joann Castaneda VOH:[id number] DOB: 03/16/1955 DOA: 02/06/2014  Referring physician: ER physician PCP: Cari Caraway, MD   Chief Complaint: confusion   HPI:  58 year old female with past medical history of cholangiocarcinoma, underwent resection of her liver and gallbladder due to cancer at [facility] in 04/2013, status post radiation and chemotherapy from September to October complicated by XRT-induced gastritis. She presented to [facility] ED today with worsening mental status over past day or so as reported by patient's husband. Per patient's husband, patient is usually able to follow commands but over past 24 hours she "acted like a child and not could not articulate things, could not answer questions". No reports of respiratory distress. No fevers or chills. No reports of abdominal pain, nausea but she did have small amount of vomiting prior to coming to ED. No complaints of chest pain, shortness of breath or palpitations. No GU complaints.  On admission, BP was 94/44, HR 62, T max 98.5 F and low at 96.7 F, oxygen saturation was 99% on room air. Blood work showed WBC count 3.6, hemoglobin 6.3, platelets 134, creatinine 1.23. Abdominal x ray showed multiple air-fluid levels without bowel dilatation, suspect early ileus or enteritis, although early partial obstruction potentially could present in this manner. She was given 1 units PRBC in ED. Pt admitted to SDU for first 24 hours due to hypotension, confusion, potential GI bleed with hemoglobin of 6.3.  Assessment & Plan    Active Problems: Acute hepatic encephalopathy - likely due to patient's history of cholangiocarcinoma - transfer to [facility] attempted by ED physician but no beds available  - ammonia level on admission 148 - will give lactulose 30 gm BID and monitor for mental status changes.  - may resume nadolol and spironolactone in  am if BP improves. - continue protonix daily  Anemia of chronic disease / Acute blood loss anemia - no reports of upper or lower GI bleed - hemoglobin 6.3 on admission. - transfused 1 unit PRBC in ED. - check post transfusion hemoglobin.   Possible SBO / ileitis - no reports of abdominal pain, abd soft on physical exam - no vomiting at this time - will continue to observe; perhaps abd x ray in am to follow up on ? SBO  Pancytopenia - due to bone marrow suppression from malignancy - continue to monitor CBC daily - using SCD's for DVT prophylaxis   Acute renal failure - likely due to dehydration versus lasix - lasix on hold  - will continue IV fluids - follow up BMP in am  Abnormal LFT's - due to cholangiocarcinoma - obtain abdominal US  H/O cholangiocarcinoma - perhaps we could try to transfer to [facility] once bed available.   DVT prophylaxis:  - SCD's bilaterally   Radiological Exams on Admission: Dg Abd Acute W/chest 02/06/2014   Multiple air-fluid levels without bowel dilatation. Suspect early ileus or enteritis, although early partial obstruction potentially could present in this manner. No free air. Lungs clear.   Electronically Signed   By: Lowella Grip M.D.   On: 02/06/2014 11:32     Code Status: Full Family Communication: Plan of care discussed with the patient's husband at the bedside  Disposition Plan: Admit for further evaluation  Leisa Lenz, MD  [facility] Pager [phone]  Review of Systems:  Constitutional: Negative for fever, chills and malaise/fatigue. Negative for diaphoresis.  HENT:  Negative for hearing loss, ear pain, nosebleeds, congestion, sore throat, neck pain, tinnitus and ear discharge.   Eyes: Negative for blurred vision, double vision, photophobia, pain, discharge and redness.  Respiratory: Negative for cough, hemoptysis, sputum production, shortness of breath, wheezing and stridor.   Cardiovascular: Negative for chest pain,  palpitations, orthopnea, claudication and leg swelling.  Gastrointestinal: per HPI.  Genitourinary: Negative for dysuria, urgency, frequency, hematuria and flank pain.  Musculoskeletal: Negative for myalgias, back pain, joint pain and falls.  Skin: Negative for itching and rash.  Neurological: positive for confusion, no weakness, no tremors, no sensory changes.  Endo/Heme/Allergies: Negative for environmental allergies and polydipsia. Does not bruise/bleed easily.  Psychiatric/Behavioral: Negative for suicidal ideas. The patient is not nervous/anxious.      Past Medical History  Diagnosis Date  . Anxiety   . Elevated liver enzymes 03-26-13  . Jaundice 03-26-13    Jaundice -presently skin and sclera  . DRESS syndrome   . Hepatic encephalopathy   . Cancer     Liver   Past Surgical History  Procedure Laterality Date  . Knee arthroscopy Bilateral     both knees  . Eus N/A 04/03/2013    Procedure: ESOPHAGEAL ENDOSCOPIC ULTRASOUND (EUS) RADIAL;  Surgeon: Arta Silence, MD;  Location: [facility];  Service: Endoscopy;  Laterality: N/A;  bx of liver lesion  . Liver resection    . Cholecystectomy     Social History:  reports that she has never smoked. She does not have any smokeless tobacco history on file. She reports that she does not drink alcohol or use illicit drugs.  Allergies  Allergen Reactions  . Rocephin [Ceftriaxone] Other (See Comments)    Dress syndrome  . Vancomycin Other (See Comments)    Dress syndrome.   Marland Kitchen Zosyn [Piperacillin Sod-Tazobactam So]     Dress syndrome  . Amoxicillin Rash  . Sulfa Antibiotics Rash    Family History:  Family History  Problem Relation Age of Onset  . Hypertension Mother   . Hypertension Father      Prior to Admission medications   Medication Sig Start Date End Date Taking? Authorizing Provider  acetaminophen (TYLENOL) 325 MG tablet Take 650 mg by mouth every 4 (four) hours as needed for moderate pain.   Yes Historical Provider, MD   furosemide (LASIX) 20 MG tablet Take 20 mg by mouth daily.   Yes Historical Provider, MD  lactulose (CHRONULAC) 10 GM/15ML solution Take 10 g by mouth 2 (two) times daily.   Yes Historical Provider, MD  nadolol (CORGARD) 20 MG tablet Take 20 mg by mouth daily.   Yes Historical Provider, MD  ondansetron (ZOFRAN-ODT) 8 MG disintegrating tablet Take 8 mg by mouth every 8 (eight) hours as needed for nausea or vomiting.   Yes Historical Provider, MD  pantoprazole (PROTONIX) 40 MG tablet Take 40 mg by mouth every 12 (twelve) hours.   Yes Historical Provider, MD  Probiotic Product (ALIGN) 4 MG CAPS Take 4 mg by mouth daily.   Yes Historical Provider, MD  sertraline (ZOLOFT) 25 MG tablet Take 25 mg by mouth every morning.   Yes Historical Provider, MD  spironolactone (ALDACTONE) 50 MG tablet Take 50 mg by mouth daily. 10/14/13 10/14/14 Yes Historical Provider, MD  sucralfate (CARAFATE) 1 GM/10ML suspension Take 1 g by mouth 4 (four) times daily. With each meal and at bedtime   Yes Historical Provider, MD   Physical Exam: Filed Vitals:   02/06/14 1900 02/06/14 2000 02/06/14 2035 02/06/14 2038  BP: 114/61 102/46 104/60   Pulse: 67 65 66 65  Temp:  96.7 F (35.9 C) 97.6 F (36.4 C)   TempSrc:  Axillary Axillary   Resp: 14 16 16 14   Height:      Weight:      SpO2: 100% 100% 100% 100%    Physical Exam  Constitutional: Appears in no distress, confused, jaundiced  HENT: Normocephalic. No tonsillar erythema or exudates Eyes: Conjunctivae and EOM are normal. PERRLA, no scleral icterus.  Neck: Normal ROM. Neck supple. No JVD. No tracheal deviation. No thyromegaly.  CVS: RRR, S1/S2 appreciated, SEM appreciated  Pulmonary: Effort and breath sounds normal, no stridor, rhonchi, wheezes, rales.  Abdominal: Soft. BS +,  no distension, tenderness, rebound or guarding.  Musculoskeletal: Normal range of motion. No edema and no tenderness.  Lymphadenopathy: No lymphadenopathy noted, cervical, inguinal. Neuro:  Alert. No focal neurologic deficits. Cant follow simple commands Skin: Skin is warm and dry.  Psychiatric: Normal mood and affect.   Labs on Admission:  Basic Metabolic Panel:  Recent Labs Lab 02/06/14 1030 02/06/14 1112 02/06/14 1444  NA 139 142 142  K 4.1 4.0 4.1  CL 105 112 110  CO2 21  --  20  GLUCOSE 122* 116* 117*  BUN 31* 28* 31*  CREATININE 1.23* 1.10 1.13*  CALCIUM 8.5  --  8.1*  MG  --   --  2.1  PHOS  --   --  3.2   Liver Function Tests:  Recent Labs Lab 02/06/14 1030 02/06/14 1444  AST 161* 136*  ALT 115* 104*  ALKPHOS 757* 682*  BILITOT 1.6* 1.3*  PROT 6.0 5.5*  ALBUMIN 1.9* 1.8*   No results for input(s): LIPASE, AMYLASE in the last 168 hours.  Recent Labs Lab 02/06/14 1030  AMMONIA 148*   CBC:  Recent Labs Lab 02/06/14 1030 02/06/14 1112 02/06/14 1444  WBC 3.6*  --  2.7*  NEUTROABS 2.6  --  1.9  HGB 6.3* 6.5* 5.9*  HCT 20.2* 19.0* 18.9*  MCV 89.8  --  89.6  PLT 134*  --  124*   Cardiac Enzymes: No results for input(s): CKTOTAL, CKMB, CKMBINDEX, TROPONINI in the last 168 hours. BNP: Invalid input(s): POCBNP CBG: No results for input(s): GLUCAP in the last 168 hours.  If 7PM-7AM, please contact night-coverage [email or web address] Password [facility] 02/06/2014, 9:17 PM

## 2014-02-06 NOTE — ED Notes (Addendum)
Pt c/o hematemesis x 3 episodes starting this morning and confusion x 4 days.  Pt is a bad historian.  Pt's husband reports Pt is being treated at [facility] for hepatic encephalopathy and was diagnosed w/ high ammonia levels x 2 days ago.  Pt was recently started on [place].  Pt had radiation from September - mid October.

## 2014-02-06 NOTE — ED Notes (Signed)
EMT transported blood for labs

## 2014-02-06 NOTE — ED Provider Notes (Signed)
CSN: [id number]     Arrival date & time 02/06/14  3419 History   First MD Initiated Contact with Patient 02/06/14 1012     Chief Complaint  Patient presents with  . Hematemesis  . Altered Mental Status    HPI  Joann Castaneda is a 58 year old female who presents today with vomiting and altered mental status. As she is unable to answer questions, her husband contributed to the majority of the interview. In March, she underwent resection of her liver and gallbladder due to cancer at [facility]. She then underwent radiation and chemotherapy from September to October complicated by XRT-induced gastritis. For the past week, she has progressively had difficulties grasping for words. A few days ago, she was seen at [facility] and diagnosed with hepatic encephalopathy (NH4 87); she was given lactulose. However, he reports she has had only 1 BM since taking this medication, and this morning, she vomited x 3 with spoonfuls of blood. He does not report that she had a fever.   Past Medical History  Diagnosis Date  . Anxiety   . Elevated liver enzymes 03-26-13  . Jaundice 03-26-13    Jaundice -presently skin and sclera  . DRESS syndrome   . Hepatic encephalopathy   . Cancer     Liver   Past Surgical History  Procedure Laterality Date  . Knee arthroscopy Bilateral     both knees  . Eus N/A 04/03/2013    Procedure: ESOPHAGEAL ENDOSCOPIC ULTRASOUND (EUS) RADIAL;  Surgeon: Arta Silence, MD;  Location: [facility];  Service: Endoscopy;  Laterality: N/A;  bx of liver lesion  . Liver resection    . Cholecystectomy     Family History  Problem Relation Age of Onset  . Hypertension Mother   . Hypertension Father    History  Substance Use Topics  . Smoking status: Never Smoker   . Smokeless tobacco: Not on file  . Alcohol Use: No   OB History    No data available     Review of Systems  Unable to perform ROS: Mental status change      Allergies  Rocephin; Vancomycin; Zosyn; Amoxicillin; and Sulfa  antibiotics  Home Medications   Prior to Admission medications   Medication Sig Start Date End Date Taking? Authorizing Provider  acetaminophen (TYLENOL) 325 MG tablet Take 650 mg by mouth every 4 (four) hours as needed for moderate pain.   Yes Historical Provider, MD  furosemide (LASIX) 20 MG tablet Take 20 mg by mouth daily.   Yes Historical Provider, MD  lactulose (CHRONULAC) 10 GM/15ML solution Take 10 g by mouth 2 (two) times daily.   Yes Historical Provider, MD  nadolol (CORGARD) 20 MG tablet Take 20 mg by mouth daily.   Yes Historical Provider, MD  ondansetron (ZOFRAN-ODT) 8 MG disintegrating tablet Take 8 mg by mouth every 8 (eight) hours as needed for nausea or vomiting.   Yes Historical Provider, MD  pantoprazole (PROTONIX) 40 MG tablet Take 40 mg by mouth every 12 (twelve) hours.   Yes Historical Provider, MD  Probiotic Product (ALIGN) 4 MG CAPS Take 4 mg by mouth daily.   Yes Historical Provider, MD  sertraline (ZOLOFT) 25 MG tablet Take 25 mg by mouth every morning.   Yes Historical Provider, MD  spironolactone (ALDACTONE) 50 MG tablet Take 50 mg by mouth daily. 10/14/13 10/14/14 Yes Historical Provider, MD  sucralfate (CARAFATE) 1 GM/10ML suspension Take 1 g by mouth 4 (four) times daily. With each meal and at  bedtime   Yes Historical Provider, MD   BP 94/44 mmHg  Pulse 62  Temp(Src) 98.2 F (36.8 C) (Oral)  Resp 15  Ht 5\' 7"  (1.702 m)  Wt 126 lb 8.7 oz (57.4 kg)  BMI 19.81 kg/m2  SpO2 100% Physical Exam  Constitutional: She appears well-developed and well-nourished. No distress.  HENT:  Head: Normocephalic and atraumatic.  Unable to exam oropharynx as patient did not open her mouth.  Eyes: Conjunctivae are normal. Pupils are equal, round, and reactive to light.  Cardiovascular: Normal rate, regular rhythm and normal heart sounds.  Exam reveals no gallop and no friction rub.   No murmur heard. Pulmonary/Chest: Effort normal and breath sounds normal. No respiratory  distress. She has no wheezes. She has no rales.  Abdominal: Soft. Bowel sounds are normal. She exhibits no distension. There is no tenderness. There is no rebound.  ~15-cm curved scar extending from RUQ to epigastric area consistent with prior surgery. Exam findings limited by her inability to answer questions on command.   Neurological: She is alert. No cranial nerve deficit.  Only answers questions with yes/no.  Skin: She is not diaphoretic.    ED Course  Procedures (including critical care time) Labs Review Labs Reviewed  CBC WITH DIFFERENTIAL - Abnormal; Notable for the following:    WBC 3.6 (*)    RBC 2.25 (*)    Hemoglobin 6.3 (*)    HCT 20.2 (*)    RDW 16.1 (*)    Platelets 134 (*)    Lymphs Abs 0.6 (*)    All other components within normal limits  AMMONIA - Abnormal; Notable for the following:    Ammonia 148 (*)    All other components within normal limits  COMPREHENSIVE METABOLIC PANEL - Abnormal; Notable for the following:    Glucose, Bld 122 (*)    BUN 31 (*)    Creatinine, Ser 1.23 (*)    Albumin 1.9 (*)    AST 161 (*)    ALT 115 (*)    Alkaline Phosphatase 757 (*)    Total Bilirubin 1.6 (*)    GFR calc non Af Amer 47 (*)    GFR calc Af Amer 54 (*)    All other components within normal limits  URINALYSIS, ROUTINE W REFLEX MICROSCOPIC - Abnormal; Notable for the following:    Color, Urine AMBER (*)    All other components within normal limits  I-STAT CHEM 8, ED - Abnormal; Notable for the following:    BUN 28 (*)    Glucose, Bld 116 (*)    Calcium, Ion 1.12 (*)    Hemoglobin 6.5 (*)    HCT 19.0 (*)    All other components within normal limits  MRSA PCR SCREENING  PROTIME-INR  COMPREHENSIVE METABOLIC PANEL  MAGNESIUM  PHOSPHORUS  CBC WITH DIFFERENTIAL  APTT  PROTIME-INR  I-STAT CG4 LACTIC ACID, ED  TYPE AND SCREEN  PREPARE RBC (CROSSMATCH)    Imaging Review Dg Abd Acute W/chest  02/06/2014   CLINICAL DATA:  Vomiting blood.  EXAM: ACUTE ABDOMEN  SERIES (ABDOMEN 2 VIEW & CHEST 1 VIEW)  COMPARISON:  Chest radiograph October 28, 2013. MR abdomen March 23, 2013  FINDINGS: PA chest: There is no edema or consolidation. Heart size and pulmonary vascularity are normal. No adenopathy. No bone lesions.  Supine and left lateral decubitus abdomen: There are multiple surgical clips in the right abdomen. There is no appreciable bowel dilatation. However, there are multiple air-fluid levels throughout the  abdomen. No free air. No abnormal calcifications. There is moderate stool throughout the colon.  IMPRESSION: Multiple air-fluid levels without bowel dilatation. Suspect early ileus or enteritis, although early partial obstruction potentially could present in this manner. No free air. Lungs clear.   Electronically Signed   By: Lowella Grip M.D.   On: 02/06/2014 11:32     EKG Interpretation None      MDM   Final diagnoses:  Vomiting  Gastrointestinal hemorrhage, unspecified gastritis, unspecified gastrointestinal hemorrhage type  Hepatic encephalopathy  Anemia, unspecified anemia type    Concern for worsening hepatic encephalopathy. Does not appear to have ascites. Will check CMET, CBC, type & screen, ammonia, UA, PT/INR, lactate.   1128AM: Hb 6 on CBC, down from 8 a few day ago. Plan to transfuse pRBC x 2. NH4 183, also up from prior. Will need to admitted.     Charlott Rakes, MD 02/06/14 [place] Alvino Chapel, MD 02/06/14 1534

## 2014-02-06 NOTE — ED Notes (Signed)
Attempted to give Lactulose, Admitting MD at bedside. Will wait until MD leaves.

## 2014-02-07 ENCOUNTER — Inpatient Hospital Stay (HOSPITAL_COMMUNITY): Payer: BC Managed Care – PPO

## 2014-02-07 DIAGNOSIS — K7290 Hepatic failure, unspecified without coma: Secondary | ICD-10-CM

## 2014-02-07 DIAGNOSIS — R1110 Vomiting, unspecified: Secondary | ICD-10-CM | POA: Diagnosis present

## 2014-02-07 DIAGNOSIS — K922 Gastrointestinal hemorrhage, unspecified: Secondary | ICD-10-CM | POA: Diagnosis present

## 2014-02-07 DIAGNOSIS — K920 Hematemesis: Principal | ICD-10-CM

## 2014-02-07 DIAGNOSIS — R7989 Other specified abnormal findings of blood chemistry: Secondary | ICD-10-CM

## 2014-02-07 DIAGNOSIS — K567 Ileus, unspecified: Secondary | ICD-10-CM | POA: Diagnosis present

## 2014-02-07 LAB — COMPREHENSIVE METABOLIC PANEL
ALT: 87 U/L — AB (ref 0–35)
AST: 115 U/L — AB (ref 0–37)
Albumin: 1.7 g/dL — ABNORMAL LOW (ref 3.5–5.2)
Alkaline Phosphatase: 575 U/L — ABNORMAL HIGH (ref 39–117)
Anion gap: 12 (ref 5–15)
BUN: 27 mg/dL — ABNORMAL HIGH (ref 6–23)
CALCIUM: 8.2 mg/dL — AB (ref 8.4–10.5)
CO2: 17 meq/L — AB (ref 19–32)
Chloride: 115 mEq/L — ABNORMAL HIGH (ref 96–112)
Creatinine, Ser: 1.03 mg/dL (ref 0.50–1.10)
GFR calc Af Amer: 67 mL/min — ABNORMAL LOW (ref >90)
GFR calc non Af Amer: 58 mL/min — ABNORMAL LOW (ref >90)
Glucose, Bld: 88 mg/dL (ref 70–99)
POTASSIUM: 3.8 meq/L (ref 3.7–5.3)
SODIUM: 144 meq/L (ref 137–147)
Total Bilirubin: 1.4 mg/dL — ABNORMAL HIGH (ref 0.3–1.2)
Total Protein: 5.2 g/dL — ABNORMAL LOW (ref 6.0–8.3)

## 2014-02-07 LAB — CBC
HCT: 25.3 % — ABNORMAL LOW (ref 36.0–46.0)
Hemoglobin: 8.5 g/dL — ABNORMAL LOW (ref 12.0–15.0)
MCH: 28.7 pg (ref 26.0–34.0)
MCHC: 33.6 g/dL (ref 30.0–36.0)
MCV: 85.5 fL (ref 78.0–100.0)
Platelets: 128 10*3/uL — ABNORMAL LOW (ref 150–400)
RBC: 2.96 MIL/uL — ABNORMAL LOW (ref 3.87–5.11)
RDW: 15.5 % (ref 11.5–15.5)
WBC: 3.5 10*3/uL — ABNORMAL LOW (ref 4.0–10.5)

## 2014-02-07 LAB — GLUCOSE, CAPILLARY: Glucose-Capillary: 87 mg/dL (ref 70–99)

## 2014-02-07 LAB — AMMONIA: Ammonia: 160 umol/L — ABNORMAL HIGH (ref 11–60)

## 2014-02-07 LAB — HEMOGLOBIN AND HEMATOCRIT, BLOOD
HCT: 24.5 % — ABNORMAL LOW (ref 36.0–46.0)
HEMATOCRIT: 29.5 % — AB (ref 36.0–46.0)
Hemoglobin: 9.6 g/dL — ABNORMAL LOW (ref 12.0–15.0)
Hemoglobin: 8.2 g/dL — ABNORMAL LOW (ref 12.0–15.0)

## 2014-02-07 MED ORDER — LACTULOSE 10 GM/15ML PO SOLN
30.0000 g | Freq: Once | ORAL | Status: AC
  Administered 2014-02-07: 30 g via ORAL
  Filled 2014-02-07: qty 45

## 2014-02-07 MED ORDER — SODIUM CHLORIDE 0.9 % IV SOLN
INTRAVENOUS | Status: DC
  Administered 2014-02-07: 75 mL/h via INTRAVENOUS
  Administered 2014-02-08 – 2014-02-09 (×2): via INTRAVENOUS

## 2014-02-07 MED ORDER — ONDANSETRON HCL 4 MG/2ML IJ SOLN
4.0000 mg | Freq: Three times a day (TID) | INTRAMUSCULAR | Status: AC
  Administered 2014-02-07 – 2014-02-11 (×12): 4 mg via INTRAVENOUS
  Filled 2014-02-07 (×12): qty 2

## 2014-02-07 MED ORDER — PANTOPRAZOLE SODIUM 40 MG IV SOLR
40.0000 mg | Freq: Two times a day (BID) | INTRAVENOUS | Status: DC
  Administered 2014-02-07 – 2014-02-11 (×9): 40 mg via INTRAVENOUS
  Filled 2014-02-07 (×11): qty 40

## 2014-02-07 MED ORDER — LACTULOSE 10 GM/15ML PO SOLN
30.0000 g | Freq: Two times a day (BID) | ORAL | Status: DC
  Administered 2014-02-07 – 2014-02-08 (×2): 30 g via ORAL
  Filled 2014-02-07 (×2): qty 45

## 2014-02-07 MED ORDER — LACTULOSE ENEMA
300.0000 mL | Freq: Two times a day (BID) | ORAL | Status: DC
  Administered 2014-02-07: 300 mL via RECTAL
  Filled 2014-02-07 (×3): qty 300

## 2014-02-07 NOTE — Progress Notes (Signed)
CARE MANAGEMENT NOTE 02/07/2014  Patient:  Joann Castaneda, Joann Castaneda   Account Number:  [id number]  Date Initiated:  02/07/2014  Documentation initiated by:  Olga Coaster  Subjective/Objective Assessment:   ADMITTED WITH ACUTE HEAPTIC ENCEPHALOPATHY     Action/Plan:   CM FOLLOWING FOR DCP   Anticipated DC Date:  02/14/2014   Anticipated DC Plan:  [place]  CM consult         Status of service:  In process, will continue to follow Medicare Important Message given?   (If response is "NO", the following Medicare IM given date fields will be blank)  Per UR Regulation:  Reviewed for med. necessity/level of care/duration of stay  Comments:  12/18/2015Mindi Slicker RN,BSN,MHA [phone]

## 2014-02-07 NOTE — Progress Notes (Signed)
Pt had large bloody emesis of gelatinous clots.  Dr. Rockne Menghini here and observed. Joann Castaneda

## 2014-02-07 NOTE — Progress Notes (Signed)
Pt had a small emesis of bright red blood clots. Dr. Rockne Menghini paged and updated.  Ladene Allocca, Beverly Gust, RN

## 2014-02-07 NOTE — Consult Note (Signed)
[facility] Gastroenterology Consultation Note  Referring Provider:  Dr. Byrd Hesselbach [facility]) Primary Care Physician:  Cari Caraway, MD Primary Gastroenterologist:  Dr. Teena Irani  Reason for Consultation:  hematemesis  HPI: Joann Castaneda is a 58 y.o. female whom I've been asked to see for evaluation of hematemesis and anemia.  Patient has history of focal left intrahepatic biliary ductal dilatation for which endoscopic ultrasound in February 2015 was unrevealing (was unable to visualize the biliary bifurcation, which is usually not well visualized on EUS).  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  Apparently patient was diagnosed (? Pathology specimen) with cirrhosis as well (PSC-related??) and had postoperative complications including post-operative abscess.  She has also developed ascites post-operatively.  Received several rounds of radiation therapy in the Fall 2015 months, last about two months ago.  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  Patient was admitted to hospital for altered mental status and was found to have elevated ammonia level.  Patient is aware but is delirious and disoriented.  About 5 o'clock this afternoon, patient also had large volume of emesis, which was admixed with some clots.  No melena or hematochezia.  No further hematemesis.   Past Medical History  Diagnosis Date  . Anxiety   . Elevated liver  enzymes 03-26-13  . Jaundice 03-26-13    Jaundice -presently skin and sclera  . DRESS syndrome   . Hepatic encephalopathy   . Cancer     Liver    Past Surgical History  Procedure Laterality Date  . Knee arthroscopy Bilateral     both knees  . Eus N/A 04/03/2013    Procedure: ESOPHAGEAL ENDOSCOPIC ULTRASOUND (EUS) RADIAL;  Surgeon: Arta Silence, MD;  Location: [facility] ENDOSCOPY;  Service: Endoscopy;  Laterality: N/A;  bx of liver lesion  . Liver resection    . Cholecystectomy      Prior to Admission medications   Medication Sig Start Date End Date Taking? Authorizing Provider  acetaminophen (TYLENOL) 325 MG tablet Take 650 mg by mouth every 4 (four) hours as needed for moderate pain.   Yes Historical Provider, MD  furosemide (LASIX) 20 MG tablet Take 20 mg by mouth daily.   Yes Historical Provider, MD  lactulose (CHRONULAC) 10 GM/15ML solution Take 10 g by mouth 2 (two) times daily.   Yes Historical Provider, MD  nadolol (CORGARD) 20 MG tablet Take 20 mg by mouth daily.   Yes Historical Provider, MD  ondansetron (ZOFRAN-ODT) 8 MG disintegrating tablet Take 8 mg by mouth every 8 (eight) hours as needed for nausea or vomiting.   Yes Historical Provider, MD  pantoprazole (PROTONIX) 40 MG tablet Take 40 mg by mouth every 12 (twelve) hours.   Yes Historical Provider, MD  Probiotic Product (ALIGN) 4 MG CAPS Take 4 mg by mouth daily.   Yes Historical Provider, MD  sertraline (ZOLOFT) 25 MG tablet Take 25 mg by mouth every morning.   Yes Historical Provider, MD  spironolactone (ALDACTONE) 50 MG tablet Take 50 mg by mouth daily.  10/14/13 10/14/14 Yes Historical Provider, MD  sucralfate (CARAFATE) 1 GM/10ML suspension Take 1 g by mouth 4 (four) times daily. With each meal and at bedtime   Yes Historical Provider, MD    Current Facility-Administered Medications  Medication Dose Route Frequency Provider Last Rate Last Dose  . 0.9 %  sodium chloride infusion  10 mL/hr Intravenous Once Charlott Rakes, MD    10 mL/hr at 02/06/14 1426  . 0.9 %  sodium chloride infusion   Intravenous Continuous Venetia Maxon Rama, MD 75 mL/hr at 02/07/14 1450 75 mL/hr at 02/07/14 1450  . acetaminophen (TYLENOL) tablet 650 mg  650 mg Oral Q6H PRN Robbie Lis, MD       Or  . acetaminophen (TYLENOL) suppository 650 mg  650 mg Rectal Q6H PRN Robbie Lis, MD      . lactulose (CHRONULAC) 10 GM/15ML solution 30 g  30 g Oral BID Venetia Maxon Rama, MD      . ondansetron (ZOFRAN) tablet 4 mg  4 mg Oral Q6H PRN Robbie Lis, MD       Or  . ondansetron [facility]) injection 4 mg  4 mg Intravenous Q6H PRN Robbie Lis, MD      . pantoprazole (PROTONIX) injection 40 mg  40 mg Intravenous Q12H Christina P Rama, MD      . sodium chloride 0.9 % injection 3 mL  3 mL Intravenous Q12H Robbie Lis, MD   3 mL at 02/06/14 2227    Allergies as of 02/06/2014 - Review Complete 02/06/2014  Allergen Reaction Noted  . Rocephin [ceftriaxone] Other (See Comments) 10/05/2013  . Vancomycin Other (See Comments) 10/05/2013  . Zosyn [piperacillin sod-tazobactam so]  10/05/2013  . Amoxicillin Rash 04/03/2013  . Sulfa antibiotics Rash 04/03/2013    Family History  Problem Relation Age of Onset  . Hypertension Mother   . Hypertension Father     History   Social History  . Marital Status: Married    Spouse Name: N/A    Number of Children: N/A  . Years of Education: N/A   Occupational History  . Not on file.   Social History Main Topics  . Smoking status: Never Smoker   . Smokeless tobacco: Not on file  . Alcohol Use: No  . Drug Use: No  . Sexual Activity: Yes   Other Topics Concern  . Not on file   Social History Narrative    Review of Systems: ROS 02/06/14 Dr. Charlies Silvers reviewed, and I agree  Physical Exam: Vital signs in last 24 hours: Temp:  [96.7 F (35.9 C)-98.9 F (37.2 C)] 98.9 F (37.2 C) (12/18 1600) Pulse Rate:  [57-83] 71 (12/18 1800) Resp:  [14-24] 18 (12/18 1800) BP: (89-138)/(45-93) 133/63 mmHg (12/18  1800) SpO2:  [94 %-100 %] 100 % (12/18 1800) Weight:  [58.2 kg (128 lb 4.9 oz)] 58.2 kg (128 lb 4.9 oz) (12/18 0400) Last BM Date: 02/06/14 General:   Disoriented, confused, Well-developed, well-nourished, pleasant and cooperative in NAD Head:  Normocephalic and atraumatic. Eyes:  Sclera clear, faint scleral icterus.   Conjunctiva pink. Ears:  Normal auditory acuity. Nose:  No deformity, discharge,  or lesions. Mouth:  No deformity or lesions.  Oropharynx pale and somewhat dry Neck:  Supple; no masses or thyromegaly. Lungs:  Clear throughout to auscultation.   No wheezes, crackles, or rhonchi. No acute distress. Heart:  Regular rate and rhythm; no murmurs, clicks, rubs,  or gallops. Abdomen:  Soft, large right upper quadrant  incision which appears to have healed by wound-vac and secondary intent. No masses, hepatosplenomegaly or hernias noted. Normal bowel sounds, without guarding, and without rebound.  Moderate ascites   Msk:  Symmetrical without gross deformities. Normal posture. Pulses:  Normal pulses noted. Extremities:  Without clubbing or edema. Neurologic:  Awake, can tract with eyes, answers questions persistently incorrectly, disoriented, can't follow commands Skin:  Intact without significant lesions or rashes. Psych: Awake but disoriented. Flat affect   Lab Results:  Recent Labs  02/06/14 1030  02/06/14 1444 02/07/14 0403 02/07/14 1804  WBC 3.6*  --  2.7* 3.5*  --   HGB 6.3*  < > 5.9* 8.5* 9.6*  HCT 20.2*  < > 18.9* 25.3* 29.5*  PLT 134*  --  124* 128*  --   < > = values in this interval not displayed. BMET  Recent Labs  02/06/14 1030 02/06/14 1112 02/06/14 1444 02/07/14 0403  NA 139 142 142 144  K 4.1 4.0 4.1 3.8  CL 105 112 110 115*  CO2 21  --  20 17*  GLUCOSE 122* 116* 117* 88  BUN 31* 28* 31* 27*  CREATININE 1.23* 1.10 1.13* 1.03  CALCIUM 8.5  --  8.1* 8.2*   LFT  Recent Labs  02/07/14 0403  PROT 5.2*  ALBUMIN 1.7*  AST 115*  ALT 87*   ALKPHOS 575*  BILITOT 1.4*   PT/INR  Recent Labs  02/06/14 1030 02/06/14 1444  LABPROT 14.3 13.8  INR 1.10 1.04    Studies/Results: US Abdomen Port  02/07/2014   CLINICAL DATA:  Abnormal LFTs.  EXAM: ULTRASOUND PORTABLE ABDOMEN  COMPARISON:  03/23/2013  FINDINGS: Gallbladder: Previous cholecystectomy.  Common bile duct: Diameter: 3 mm  Liver: The liver has a heterogeneous echotexture. The bile ducts in the right hepatic lobe are dilated. Left hepatic lobe is not well visualized.  IVC: No abnormality visualized.  Pancreas: Visualized portion unremarkable.  Spleen: Measures 11.1 cm in length.  Right Kidney: Length: 11 cm. Echogenicity within normal limits. No mass or hydronephrosis visualized.  Left Kidney: Length: 10.2 cm. Echogenicity within normal limits. No mass or hydronephrosis visualized.  Abdominal aorta: No aneurysm visualized.  Other findings: Ascites is identified. There is a left pleural effusion.  IMPRESSION: 1. Abdominal ascites. 2. Non visualization of left hepatic lobe and left lobe of liver mass. The bile ducts in the right lobe are dilated. 3. Prior cholecystectomy.   Electronically Signed   By: Kerby Moors M.D.   On: 02/07/2014 13:32   Dg Abd Acute W/chest  02/06/2014   CLINICAL DATA:  Vomiting blood.  EXAM: ACUTE ABDOMEN SERIES (ABDOMEN 2 VIEW & CHEST 1 VIEW)  COMPARISON:  Chest radiograph October 28, 2013. MR abdomen March 23, 2013  FINDINGS: PA chest: There is no edema or consolidation. Heart size and pulmonary vascularity are normal. No adenopathy. No bone lesions.  Supine and left lateral decubitus abdomen: There are multiple surgical clips in the right abdomen. There is no appreciable bowel dilatation. However, there are multiple air-fluid levels throughout the abdomen. No free air. No abnormal calcifications. There is moderate stool throughout the colon.  IMPRESSION: Multiple air-fluid levels without bowel dilatation. Suspect early ileus or enteritis, although  early partial obstruction potentially could present in this manner. No free air. Lungs clear.   Electronically Signed   By: Lowella Grip M.D.   On: 02/06/2014 11:32   Dg Abd Portable 1v  02/07/2014   CLINICAL DATA:  Followup ileus  EXAM: PORTABLE ABDOMEN -  1 VIEW  COMPARISON:  02/06/2014.  FINDINGS: The bowel gas pattern is normal. No radio-opaque calculi or other significant radiographic abnormality are seen.  IMPRESSION: Normal bowel gas pattern. No significant bowel dilatation identified to suggest obstruction or ileus.   Electronically Signed   By: Kerby Moors M.D.   On: 02/07/2014 11:08    Impression:  1.  Hematemesis, hemodynamically stable, and resolved at present.  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  Doubt variceal bleeding.  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 2.  Anemia.  Likely radiation gastroduodenitis-related. 3.  Cholangiocarcinoma with left hepatectomy and Roux-en-Y hepaticojejunostomy about 6 months ago at [facility]. 4.  Cirrhosis per review of [facility] records. 5.  Altered mental status, likely (at least in part) hepatic encephalopathy related.  Perhaps triggered by patient's GI tract bleeding. 6.  Ascites, suspect cirrhosis-related.  Unclear if patient could have component of carcinomatosis.  Plan:  1.  Patient's bleeding is not destabilizing and has resolved at present.  I would favor medical management (scheduled parenteral antiemetics, scheduled parenteral PPI), unless patient has continuous destabilizing bleeding, in which case we could consider endoscopy.  Husband agrees to hold off on endoscopy otherwise; he is aware that if we have to do an emergent endoscopy, his wife would likely require intubation for airway protection. 2.  If patient's mental status doesn't appreciably improve by tomorrow morning, would consider  lactulose enemas. 3.  Would be reluctant to feed patient until her mental status improves. 4.  Once patient's mental status improves, would add sucralfate suspension 1 gram po tid to her regimen. 5.  Follow serial CBCs. 6.  Will follow; thank you very much for the consultation.   LOS: 1 day   Aliyanna Wassmer M  02/07/2014, 7:50 PM

## 2014-02-07 NOTE — Progress Notes (Signed)
Progress Note   Joann Castaneda ELF:[id number] DOB: Jul 11, 1955 DOA: 02/06/2014 PCP: Cari Caraway, MD   Brief Narrative:   Joann Castaneda is an 58 y.o. female with past medical history of cholangiocarcinoma, underwent resection of her liver and gallbladder at [facility] in 04/2013, status post radiation and chemotherapy from September to October complicated by XRT-induced gastritis. She was admitted 02/06/14 with worsening mental status over past day or so as reported by patient's husband. Per patient's husband, patient is usually able to follow commands but over past 24 hours she "acted like a child and not could not articulate things, could not answer questions". No reports of respiratory distress. No fevers or chills. No reports of abdominal pain, nausea but she did have small amount of vomiting prior to coming to ED. No complaints of chest pain, shortness of breath or palpitations. No GU complaints.   On admission, BP was 94/44, HR 62, T max 98.5 F and low at 96.7 F, oxygen saturation was 99% on room air. Blood work showed WBC count 3.6, hemoglobin 6.3, platelets 134, creatinine 1.23. Abdominal x ray showed multiple air-fluid levels without bowel dilatation, suspicious of early ileus or enteritis, although early partial obstruction potentially could present in this manner. She was given 1 units PRBC in ED. Pt admitted to SDU for first 24 hours due to hypotension, confusion, potential GI bleed with hemoglobin of 6.3.  Assessment/Plan:   Active Problems: Acute hepatic encephalopathy secondary to upper GI bleed -Suspect this is secondary to acute GI bleeding in the setting of cholangiocarcinoma. -Transfer to [facility] attempted by ED physician but no beds available.  -Ammonia level on admission 148. -Continue lactulose 30 gm BID and monitor for mental status changes. Attempted lactulose enema, but the patient forcibly expelled enema. - Resume nadolol and spironolactone when BP improves and able  to take by mouth medications.. -Continue protonix but change to twice a day dosing given upper GI bleeding. -Check hemoglobin/hematocrit every 6 hours. -GI consultation requested and spoke with Dr. Paulita Fujita who will see the patient in consultation..  Anemia of chronic disease / Acute blood loss anemia -Nursing staff report that the patient is vomiting up gelatinous clots. -Hemoglobin was 6.3 on admission, dropped further but then increased to 8.5 after being given 1 unit of PRBCs.. -Check serial H/H given evidence of acute GI bleeding.  Possible SBO / ileitis -Initial abdominal films showed air-fluid levels with concern for ileus versus early small bowel obstruction. -Follow-up films show normal bowel gas pattern.  Pancytopenia -Secondary to bone marrow suppression from malignancy. -Continue to monitor CBC daily. -Continue SCD's for DVT prophylaxis.   Acute renal failure -Likely due to dehydration versus lasix. -Continue to hold Lasix. -Continue gentle IV fluids. - Renal function improved.  Abnormal LFT's -Secondary to cholangiocarcinoma. - Abdominal ultrasound showed ascites with bile ducts of the right lobe dilated.  H/O cholangiocarcinoma -Attempt transfer to [facility] once bed available.   DVT prophylaxis:  - SCD's bilaterally.  Code Status: Full. Family Communication: Husband Joann Castaneda) updated at the bedside. Disposition Plan: Home when stable.   IV Access:    Peripheral IV   Procedures and diagnostic studies:   Dg Abd Acute W/chest 02/06/2014 Multiple air-fluid levels without bowel dilatation. Suspect early ileus or enteritis, although early partial obstruction potentially could present in this manner. No free air. Lungs clear.   US Abdomen Port 02/07/2014: 1. Abdominal ascites. 2. Non visualization of left hepatic lobe and left lobe of liver mass. The bile ducts in  the right lobe are dilated. 3. Prior cholecystectomy.    Dg Abd Portable 1v 02/07/2014: Normal  bowel gas pattern. No significant bowel dilatation identified to suggest obstruction or ileus.     Medical Consultants:    Dr. Wonda Horner, Gastroenterology.  Anti-Infectives:   None.  Subjective:   Joann Castaneda is lethargic and unable to follow commands. Her speech is unintelligible. At 5 PM, nursing staff reported that she was vomiting up gelatinous clots and bright red blood.  Objective:    Filed Vitals:   02/07/14 0000 02/07/14 0200 02/07/14 0400 02/07/14 0600  BP: 93/48 89/45 92/47  99/46  Pulse: 64 61 63 65  Temp: 97.3 F (36.3 C)  97.6 F (36.4 C)   TempSrc: Axillary  Axillary   Resp: 15 15 14 14   Height:      Weight:   58.2 kg (128 lb 4.9 oz)   SpO2: 100% 99% 99% 99%    Intake/Output Summary (Last 24 hours) at 02/07/14 0746 Last data filed at 02/07/14 0400  Gross per 24 hour  Intake 1717.5 ml  Output    700 ml  Net 1017.5 ml    Exam: Gen:  Restless/disoriented/lethargic Cardiovascular:  RRR, No M/R/G Respiratory:  Lungs CTAB Gastrointestinal:  Abdomen soft, NT/ND, + BS Extremities:  No C/E/C   Data Reviewed:    Labs: Basic Metabolic Panel:  Recent Labs Lab 02/06/14 1030 02/06/14 1112 02/06/14 1444 02/07/14 0403  NA 139 142 142 144  K 4.1 4.0 4.1 3.8  CL 105 112 110 115*  CO2 21  --  20 17*  GLUCOSE 122* 116* 117* 88  BUN 31* 28* 31* 27*  CREATININE 1.23* 1.10 1.13* 1.03  CALCIUM 8.5  --  8.1* 8.2*  MG  --   --  2.1  --   PHOS  --   --  3.2  --    GFR Estimated Creatinine Clearance: 53.4 mL/min (by C-G formula based on Cr of 1.03). Liver Function Tests:  Recent Labs Lab 02/06/14 1030 02/06/14 1444 02/07/14 0403  AST 161* 136* 115*  ALT 115* 104* 87*  ALKPHOS 757* 682* 575*  BILITOT 1.6* 1.3* 1.4*  PROT 6.0 5.5* 5.2*  ALBUMIN 1.9* 1.8* 1.7*    Recent Labs Lab 02/06/14 1030 02/07/14 0354  AMMONIA 148* 160*   Coagulation profile  Recent Labs Lab 02/06/14 1030 02/06/14 1444  INR 1.10 1.04    CBC:  Recent  Labs Lab 02/06/14 1030 02/06/14 1112 02/06/14 1444 02/07/14 0403  WBC 3.6*  --  2.7* 3.5*  NEUTROABS 2.6  --  1.9  --   HGB 6.3* 6.5* 5.9* 8.5*  HCT 20.2* 19.0* 18.9* 25.3*  MCV 89.8  --  89.6 85.5  PLT 134*  --  124* 128*   Sepsis Labs:  Recent Labs Lab 02/06/14 1030 02/06/14 1112 02/06/14 1444 02/07/14 0403  WBC 3.6*  --  2.7* 3.5*  LATICACIDVEN  --  1.50  --   --    Microbiology Recent Results (from the past 240 hour(s))  MRSA PCR Screening     Status: None   Collection Time: 02/06/14  1:46 PM  Result Value Ref Range Status   MRSA by PCR NEGATIVE NEGATIVE Final    Comment:        The GeneXpert MRSA Assay (FDA approved for NASAL specimens only), is one component of a comprehensive MRSA colonization surveillance program. It is not intended to diagnose MRSA infection nor to guide or monitor treatment for MRSA  infections.      Medications:   . sodium chloride  10 mL/hr Intravenous Once  . lactulose  30 g Per Tube BID  . nadolol  20 mg Oral Daily  . pantoprazole (PROTONIX) IV  40 mg Intravenous Q24H  . sertraline  25 mg Oral q morning - 10a  . sodium chloride  3 mL Intravenous Q12H  . spironolactone  50 mg Oral Daily  . sucralfate  1 g Oral TID WC & HS   Continuous Infusions: . sodium chloride 75 mL/hr at 02/07/14 0228    Time spent: 35 minutes.  The patient is medically complex and requires high complexity decision making.    LOS: 1 day   Joann Castaneda  [facility] Pager [phone]. If unable to reach me by pager, please call my cell phone at [phone].  *Please refer to [email or web address], password [email or web address] to get updated schedule on who will round on this patient, as hospitalists switch teams weekly. If 7PM-7AM, please contact night-coverage at [email or web address], password [email or web address] for any overnight needs.  02/07/2014, 7:46 AM

## 2014-02-07 NOTE — Progress Notes (Signed)
Lactulose enema given per rectum using catheter with 30cc balloon. Pt repeatedly expelled enema along with formed bloody brown stool; she expelled the catheter with inflated balloon twice and was unable to retain enema. Dr. Rockne Menghini paged and updated. Diyan Dave, Beverly Gust, RN

## 2014-02-08 DIAGNOSIS — R112 Nausea with vomiting, unspecified: Secondary | ICD-10-CM

## 2014-02-08 LAB — HEMOGLOBIN AND HEMATOCRIT, BLOOD
HCT: 24.1 % — ABNORMAL LOW (ref 36.0–46.0)
HCT: 25.4 % — ABNORMAL LOW (ref 36.0–46.0)
Hemoglobin: 7.6 g/dL — ABNORMAL LOW (ref 12.0–15.0)
Hemoglobin: 7.6 g/dL — ABNORMAL LOW (ref 12.0–15.0)

## 2014-02-08 LAB — BASIC METABOLIC PANEL
Anion gap: 13 (ref 5–15)
BUN: 28 mg/dL — AB (ref 6–23)
CALCIUM: 8.1 mg/dL — AB (ref 8.4–10.5)
CO2: 15 meq/L — AB (ref 19–32)
CREATININE: 0.98 mg/dL (ref 0.50–1.10)
Chloride: 118 mEq/L — ABNORMAL HIGH (ref 96–112)
GFR calc Af Amer: 71 mL/min — ABNORMAL LOW (ref >90)
GFR calc non Af Amer: 61 mL/min — ABNORMAL LOW (ref >90)
GLUCOSE: 114 mg/dL — AB (ref 70–99)
Potassium: 4.1 mEq/L (ref 3.7–5.3)
Sodium: 146 mEq/L (ref 137–147)

## 2014-02-08 LAB — GLUCOSE, CAPILLARY: Glucose-Capillary: 106 mg/dL — ABNORMAL HIGH (ref 70–99)

## 2014-02-08 MED ORDER — PROMETHAZINE HCL 25 MG/ML IJ SOLN
6.2500 mg | Freq: Four times a day (QID) | INTRAMUSCULAR | Status: DC | PRN
  Administered 2014-02-08: 6.25 mg via INTRAVENOUS
  Filled 2014-02-08: qty 1

## 2014-02-08 MED ORDER — LACTULOSE ENEMA
300.0000 mL | Freq: Two times a day (BID) | ORAL | Status: DC
  Administered 2014-02-08: 300 mL via RECTAL
  Filled 2014-02-08 (×5): qty 300

## 2014-02-08 NOTE — Progress Notes (Signed)
Progress Note   CARRISA KELLER AST:[id number] DOB: 1955-04-02 DOA: 02/06/2014 PCP: Cari Caraway, MD   Brief Narrative:   Joann Castaneda is an 58 y.o. female with past medical history of cholangiocarcinoma, underwent resection of her liver and gallbladder at [facility] in 04/2013, status post radiation and chemotherapy from September to October complicated by XRT-induced gastritis. She was admitted 02/06/14 with worsening mental status over past day or so as reported by patient's husband. Per patient's husband, patient is usually able to follow commands but over past 24 hours she "acted like a child and not could not articulate things, could not answer questions". No reports of respiratory distress. No fevers or chills. No reports of abdominal pain, nausea but she did have small amount of vomiting prior to coming to ED. No complaints of chest pain, shortness of breath or palpitations. No GU complaints.   On admission, BP was 94/44, HR 62, T max 98.5 F and low at 96.7 F, oxygen saturation was 99% on room air. Blood work showed WBC count 3.6, hemoglobin 6.3, platelets 134, creatinine 1.23. Abdominal x ray showed multiple air-fluid levels without bowel dilatation, suspicious of early ileus or enteritis, although early partial obstruction potentially could present in this manner. She was given 1 units PRBC in ED. Pt admitted to SDU for first 24 hours due to hypotension, confusion, potential GI bleed with hemoglobin of 6.3.  Assessment/Plan:   Active Problems: Acute hepatic encephalopathy secondary to upper GI bleed -Suspect this is secondary to acute GI bleeding in the setting of cholangiocarcinoma. -Transfer to [facility] attempted by ED physician but no beds available.  -Ammonia level on admission 148. -Continue lactulose 30 gm BID and monitor for mental status changes. Attempted lactulose enema, but the patient forcibly expelled enema. - Resume nadolol and spironolactone when BP improves and able  to take by mouth medications. -Continue BID protonix given upper GI bleeding. -Hemoglobin/hematocrit have trended down over the past 12 hours. -GI consultation performed by Dr. Paulita Fujita 02/07/14, with recommendations for medical management.  Anemia of chronic disease / Acute blood loss anemia -Nursing staff reported that the patient had an episode of hematemesis 02/07/14. -Status post 1 unit of PRBCs on admission, but has not required further blood transfusion. -Continue to check serial H/H given evidence of acute GI bleeding 02/07/14.  Possible SBO / ileitis -Initial abdominal films showed air-fluid levels with concern for ileus versus early small bowel obstruction. -Follow-up films showed normal bowel gas pattern.  Pancytopenia -Secondary to bone marrow suppression from malignancy. -Continue to monitor CBC daily. -Continue SCD's for DVT prophylaxis.   Acute renal failure -Likely due to dehydration versus lasix. -Continue to hold Lasix. -Continue gentle IV fluids. - Renal function improved.  Abnormal LFT's -Secondary to cholangiocarcinoma. - Abdominal ultrasound showed ascites with bile ducts of the right lobe dilated.  H/O cholangiocarcinoma -Attempt transfer to [facility] once bed available.  DVT prophylaxis:  - SCD's bilaterally.  Code Status: Full. Family Communication: Husband Jenny Reichmann) updated at the bedside. Disposition Plan: Home when stable.   IV Access:    Peripheral IV   Procedures and diagnostic studies:   Dg Abd Acute W/chest 02/06/2014 Multiple air-fluid levels without bowel dilatation. Suspect early ileus or enteritis, although early partial obstruction potentially could present in this manner. No free air. Lungs clear.   US Abdomen Port 02/07/2014: 1. Abdominal ascites. 2. Non visualization of left hepatic lobe and left lobe of liver mass. The bile ducts in the right lobe are dilated. 3.  Prior cholecystectomy.    Dg Abd Portable 1v 02/07/2014: Normal  bowel gas pattern. No significant bowel dilatation identified to suggest obstruction or ileus.     Medical Consultants:    Dr. Wonda Horner, Gastroenterology.  Anti-Infectives:   None.  Subjective:   Joann Castaneda is more awake and alert and minimally verbal. She has not had any further hematemesis although she did vomit this morning. She denies pain or dyspnea.  Objective:    Filed Vitals:   02/08/14 1000 02/08/14 1100 02/08/14 1200 02/08/14 1401  BP: 115/55  96/55 112/65  Pulse:      Temp:   98.1 F (36.7 C)   TempSrc:   Axillary   Resp: 19 15 13 17   Height:      Weight:      SpO2:   100%     Intake/Output Summary (Last 24 hours) at 02/08/14 1438 Last data filed at 02/08/14 1400  Gross per 24 hour  Intake 1857.5 ml  Output    620 ml  Net 1237.5 ml    Exam: Gen:  More awake/alert Cardiovascular:  RRR, No M/R/G Respiratory:  Lungs CTAB Gastrointestinal:  Abdomen soft, NT/ND, + BS Extremities:  No C/E/C   Data Reviewed:    Labs: Basic Metabolic Panel:  Recent Labs Lab 02/06/14 1030 02/06/14 1112 02/06/14 1444 02/07/14 0403 02/08/14 0345  NA 139 142 142 144 146  K 4.1 4.0 4.1 3.8 4.1  CL 105 112 110 115* 118*  CO2 21  --  20 17* 15*  GLUCOSE 122* 116* 117* 88 114*  BUN 31* 28* 31* 27* 28*  CREATININE 1.23* 1.10 1.13* 1.03 0.98  CALCIUM 8.5  --  8.1* 8.2* 8.1*  MG  --   --  2.1  --   --   PHOS  --   --  3.2  --   --    GFR Estimated Creatinine Clearance: 55.8 mL/min (by C-G formula based on Cr of 0.98). Liver Function Tests:  Recent Labs Lab 02/06/14 1030 02/06/14 1444 02/07/14 0403  AST 161* 136* 115*  ALT 115* 104* 87*  ALKPHOS 757* 682* 575*  BILITOT 1.6* 1.3* 1.4*  PROT 6.0 5.5* 5.2*  ALBUMIN 1.9* 1.8* 1.7*    Recent Labs Lab 02/06/14 1030 02/07/14 0354  AMMONIA 148* 160*   Coagulation profile  Recent Labs Lab 02/06/14 1030 02/06/14 1444  INR 1.10 1.04    CBC:  Recent Labs Lab 02/06/14 1030  02/06/14 1444  02/07/14 0403 02/07/14 1804 02/07/14 2220 02/08/14 0345 02/08/14 1013  WBC 3.6*  --  2.7* 3.5*  --   --   --   --   NEUTROABS 2.6  --  1.9  --   --   --   --   --   HGB 6.3*  < > 5.9* 8.5* 9.6* 8.2* 7.6* 7.6*  HCT 20.2*  < > 18.9* 25.3* 29.5* 24.5* 24.1* 25.4*  MCV 89.8  --  89.6 85.5  --   --   --   --   PLT 134*  --  124* 128*  --   --   --   --   < > = values in this interval not displayed. Sepsis Labs:  Recent Labs Lab 02/06/14 1030 02/06/14 1112 02/06/14 1444 02/07/14 0403  WBC 3.6*  --  2.7* 3.5*  LATICACIDVEN  --  1.50  --   --    Microbiology Recent Results (from the past 240 hour(s))  MRSA PCR  Screening     Status: None   Collection Time: 02/06/14  1:46 PM  Result Value Ref Range Status   MRSA by PCR NEGATIVE NEGATIVE Final    Comment:        The GeneXpert MRSA Assay (FDA approved for NASAL specimens only), is one component of a comprehensive MRSA colonization surveillance program. It is not intended to diagnose MRSA infection nor to guide or monitor treatment for MRSA infections.      Medications:   . sodium chloride  10 mL/hr Intravenous Once  . lactulose  30 g Oral BID  . ondansetron (ZOFRAN) IV  4 mg Intravenous 3 times per day  . pantoprazole (PROTONIX) IV  40 mg Intravenous Q12H  . sodium chloride  3 mL Intravenous Q12H   Continuous Infusions: . sodium chloride 75 mL/hr (02/07/14 1450)    Time spent: 35 minutes.  The patient is medically complex and requires high complexity decision making.    LOS: 2 days   RAMA,CHRISTINA  [facility] Pager [phone]. If unable to reach me by pager, please call my cell phone at [phone].  *Please refer to [email or web address], password [email or web address] to get updated schedule on who will round on this patient, as hospitalists switch teams weekly. If 7PM-7AM, please contact night-coverage at [email or web address], password [email or web address] for any overnight needs.  02/08/2014, 2:38 PM

## 2014-02-09 DIAGNOSIS — D62 Acute posthemorrhagic anemia: Secondary | ICD-10-CM | POA: Diagnosis present

## 2014-02-09 DIAGNOSIS — E876 Hypokalemia: Secondary | ICD-10-CM

## 2014-02-09 LAB — CBC
HEMATOCRIT: 22.2 % — AB (ref 36.0–46.0)
HEMOGLOBIN: 7.2 g/dL — AB (ref 12.0–15.0)
MCH: 29 pg (ref 26.0–34.0)
MCHC: 32.4 g/dL (ref 30.0–36.0)
MCV: 89.5 fL (ref 78.0–100.0)
Platelets: 135 10*3/uL — ABNORMAL LOW (ref 150–400)
RBC: 2.48 MIL/uL — ABNORMAL LOW (ref 3.87–5.11)
RDW: 16.6 % — ABNORMAL HIGH (ref 11.5–15.5)
WBC: 3.5 10*3/uL — AB (ref 4.0–10.5)

## 2014-02-09 LAB — GLUCOSE, CAPILLARY
GLUCOSE-CAPILLARY: 75 mg/dL (ref 70–99)
Glucose-Capillary: 115 mg/dL — ABNORMAL HIGH (ref 70–99)

## 2014-02-09 LAB — BASIC METABOLIC PANEL
Anion gap: 11 (ref 5–15)
BUN: 24 mg/dL — ABNORMAL HIGH (ref 6–23)
CHLORIDE: 120 meq/L — AB (ref 96–112)
CO2: 17 meq/L — AB (ref 19–32)
CREATININE: 1.05 mg/dL (ref 0.50–1.10)
Calcium: 8.2 mg/dL — ABNORMAL LOW (ref 8.4–10.5)
GFR calc Af Amer: 66 mL/min — ABNORMAL LOW (ref >90)
GFR calc non Af Amer: 57 mL/min — ABNORMAL LOW (ref >90)
Glucose, Bld: 89 mg/dL (ref 70–99)
Potassium: 3.5 mEq/L — ABNORMAL LOW (ref 3.7–5.3)
SODIUM: 148 meq/L — AB (ref 137–147)

## 2014-02-09 LAB — HEMOGLOBIN AND HEMATOCRIT, BLOOD
HEMATOCRIT: 27.3 % — AB (ref 36.0–46.0)
HEMOGLOBIN: 8.9 g/dL — AB (ref 12.0–15.0)

## 2014-02-09 LAB — PREPARE RBC (CROSSMATCH)

## 2014-02-09 LAB — OCCULT BLOOD X 1 CARD TO LAB, STOOL: Fecal Occult Bld: POSITIVE — AB

## 2014-02-09 MED ORDER — LACTULOSE ENEMA
300.0000 mL | Freq: Once | ORAL | Status: AC
  Administered 2014-02-10: 300 mL via RECTAL
  Filled 2014-02-09: qty 300

## 2014-02-09 MED ORDER — CIPROFLOXACIN HCL 500 MG PO TABS
500.0000 mg | ORAL_TABLET | ORAL | Status: DC
  Administered 2014-02-10 – 2014-02-12 (×4): 500 mg via ORAL
  Filled 2014-02-09 (×7): qty 1

## 2014-02-09 MED ORDER — RIFAXIMIN 550 MG PO TABS
550.0000 mg | ORAL_TABLET | Freq: Two times a day (BID) | ORAL | Status: DC
  Administered 2014-02-09 – 2014-02-12 (×6): 550 mg via ORAL
  Filled 2014-02-09 (×12): qty 1

## 2014-02-09 MED ORDER — SODIUM CHLORIDE 0.9 % IV SOLN
Freq: Once | INTRAVENOUS | Status: AC
  Administered 2014-02-09: 10:00:00 via INTRAVENOUS

## 2014-02-09 MED ORDER — SUCRALFATE 1 GM/10ML PO SUSP
1.0000 g | Freq: Three times a day (TID) | ORAL | Status: DC
Start: 2014-02-09 — End: 2014-02-09
  Administered 2014-02-09: 1 g via ORAL
  Filled 2014-02-09: qty 10

## 2014-02-09 MED ORDER — SUCRALFATE 1 GM/10ML PO SUSP
1.0000 g | ORAL | Status: DC
  Administered 2014-02-10 – 2014-02-12 (×3): 1 g via ORAL
  Filled 2014-02-09 (×4): qty 10

## 2014-02-09 MED ORDER — LACTULOSE 10 GM/15ML PO SOLN
30.0000 g | Freq: Two times a day (BID) | ORAL | Status: DC
  Administered 2014-02-09 – 2014-02-12 (×6): 30 g via ORAL
  Filled 2014-02-09 (×8): qty 45

## 2014-02-09 MED ORDER — SUCRALFATE 1 GM/10ML PO SUSP
1.0000 g | Freq: Three times a day (TID) | ORAL | Status: DC
  Administered 2014-02-09 – 2014-02-12 (×8): 1 g via ORAL
  Filled 2014-02-09 (×11): qty 10

## 2014-02-09 MED ORDER — POTASSIUM CHLORIDE IN NACL 40-0.9 MEQ/L-% IV SOLN
INTRAVENOUS | Status: DC
  Administered 2014-02-09: 75 mL/h via INTRAVENOUS
  Filled 2014-02-09: qty 1000

## 2014-02-09 NOTE — Progress Notes (Signed)
Subjective: Some bleeding with lactulose enemas. Mental status is improving.  Objective: Vital signs in last 24 hours: Temp:  [97 F (36.1 C)-98.3 F (36.8 C)] 98.3 F (36.8 C) (12/20 1100) Pulse Rate:  [61-87] 87 (12/20 1200) Resp:  [13-23] 16 (12/20 1200) BP: (93-128)/(46-77) 128/77 mmHg (12/20 1200) SpO2:  [100 %] 100 % (12/20 1200) Weight:  [59.3 kg (130 lb 11.7 oz)] 59.3 kg (130 lb 11.7 oz) (12/20 0400) Weight change: 1.4 kg (3 lb 1.4 oz) Last BM Date: 02/09/14  PE: GEN:  Alert, able to answer questions, chronically ill-appearing ABD:  Moderate distended with ascites, old surgical scars, non-tender  Lab Results: CBC    Component Value Date/Time   WBC 3.5* 02/09/2014 0336   RBC 2.48* 02/09/2014 0336   HGB 7.2* 02/09/2014 0336   HCT 22.2* 02/09/2014 0336   PLT 135* 02/09/2014 0336   MCV 89.5 02/09/2014 0336   MCH 29.0 02/09/2014 0336   MCHC 32.4 02/09/2014 0336   RDW 16.6* 02/09/2014 0336   LYMPHSABS 0.5* 02/06/2014 1444   MONOABS 0.3 02/06/2014 1444   EOSABS 0.0 02/06/2014 1444   BASOSABS 0.0 02/06/2014 1444   CMP     Component Value Date/Time   NA 148* 02/09/2014 0336   K 3.5* 02/09/2014 0336   CL 120* 02/09/2014 0336   CO2 17* 02/09/2014 0336   GLUCOSE 89 02/09/2014 0336   BUN 24* 02/09/2014 0336   CREATININE 1.05 02/09/2014 0336   CALCIUM 8.2* 02/09/2014 0336   PROT 5.2* 02/07/2014 0403   ALBUMIN 1.7* 02/07/2014 0403   AST 115* 02/07/2014 0403   ALT 87* 02/07/2014 0403   ALKPHOS 575* 02/07/2014 0403   BILITOT 1.4* 02/07/2014 0403   GFRNONAA 57* 02/09/2014 0336   GFRAA 66* 02/09/2014 0336   Assessment:  1. Hematemesis, hemodynamically stable, and resolved at present. No hematemesis for a couple days. 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 Doubt variceal bleeding. Patient had endoscopy about two weeks ago at [facility] for anemia, which showed radiation gastroduodenitis (likely source of anemia) and small esophageal varices (not felt to be bleeding  source). 2. Anemia. Likely radiation gastroduodenitis-related. 3. 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 4. Cirrhosis per review of [facility] records. 5. Altered mental status, likely (at least in part) hepatic encephalopathy related. Perhaps triggered by patient's GI tract bleeding.  Improved with lactulose enemas. 6. Ascites, suspect cirrhosis-related. Unclear if patient could have component of carcinomatosis. 7.  Post-enema hematochezia, resolved, suspect trauma-related (and worsened by patient's relative thrombocytopenia).  Plan:  1.  Continue lactulose and sucralfate. 2.  Add xifaxan 550 mg po bid. 3.  Increase activity as tolerated, wonder if PT evaluation would be helpful. 4.  Advance diet as tolerated. 5.  Will follow.   Joann Castaneda 02/09/2014, 1:04 PM

## 2014-02-09 NOTE — Progress Notes (Addendum)
Progress Note   Joann Castaneda YQM:[id number] DOB: 08/31/1955 DOA: 02/06/2014 PCP: Cari Caraway, MD   Brief Narrative:   Joann Castaneda is an 58 y.o. female with past medical history of cholangiocarcinoma, underwent resection of her liver and gallbladder at [facility] in 04/2013, status post radiation and chemotherapy from September to October complicated by XRT-induced gastritis. She was admitted 02/06/14 with worsening mental status over past day or so as reported by patient's husband. Per patient's husband, patient is usually able to follow commands but over past 24 hours she "acted like a child and not could not articulate things, could not answer questions". No reports of respiratory distress. No fevers or chills. No reports of abdominal pain, nausea but she did have small amount of vomiting prior to coming to ED. No complaints of chest pain, shortness of breath or palpitations. No GU complaints.   On admission, BP was 94/44, HR 62, T max 98.5 F and low at 96.7 F, oxygen saturation was 99% on room air. Blood work showed WBC count 3.6, hemoglobin 6.3, platelets 134, creatinine 1.23. Abdominal x ray showed multiple air-fluid levels without bowel dilatation, suspicious of early ileus or enteritis, although early partial obstruction potentially could present in this manner. She was given 1 units PRBC in ED. Pt admitted to SDU for first 24 hours due to hypotension, confusion, potential GI bleed with hemoglobin of 6.3.  Assessment/Plan:   Active Problems: Acute hepatic encephalopathy secondary to upper GI bleed -Suspect this is secondary to acute GI bleeding in the setting of cholangiocarcinoma. -Ammonia level on admission 148. -Continue lactulose 30 gm BID & Carafate.  Xifaxan added. - Nadolol and spironolactone remain on hold secondary to soft BP. -Continue BID protonix given upper GI bleeding. -Hemoglobin/hematocrit have trended down over the past 36 hours, currently 7.2 mg/dL. We'll  give a unit of blood today. -GI consultation performed by Dr. Paulita Fujita 02/07/14, with recommendations for medical management.  Anemia of chronic disease / Acute blood loss anemia -Nursing staff reported that the patient had an episode of hematemesis 02/07/14. -Status post 1 unit of PRBCs on admission, we'll give an additional unit today. -Continue to check serial H/H given evidence of acute GI bleeding 02/07/14.   Possible SBO / ileus / vomiting -Initial abdominal films showed air-fluid levels with concern for ileus versus early small bowel obstruction. -Follow-up films showed normal bowel gas pattern.  Pancytopenia -Secondary to bone marrow suppression from malignancy. -Continue to monitor CBC daily. -Continue SCD's for DVT prophylaxis.   Acute renal failure -Likely due to dehydration versus lasix. -Continue to hold Lasix. -Continue gentle IV fluids. - Renal function improved.  Abnormal LFT's -Secondary to cholangiocarcinoma. - Abdominal ultrasound showed ascites with bile ducts of the right lobe dilated.  H/O cholangiocarcinoma -F/U [facility] post discharge.  Hypokalemia - We'll add potassium to IV fluids.  DVT prophylaxis:  - SCD's bilaterally.  Code Status: Full. Family Communication: Husband Jenny Reichmann) updated at the bedside. Disposition Plan: Home when stable.   IV Access:    Peripheral IV   Procedures and diagnostic studies:   Dg Abd Acute W/chest 02/06/2014 Multiple air-fluid levels without bowel dilatation. Suspect early ileus or enteritis, although early partial obstruction potentially could present in this manner. No free air. Lungs clear.   US Abdomen Port 02/07/2014: 1. Abdominal ascites. 2. Non visualization of left hepatic lobe and left lobe of liver mass. The bile ducts in the right lobe are dilated. 3. Prior cholecystectomy.    Dg Abd Portable  1v 02/07/2014: Normal bowel gas pattern. No significant bowel dilatation identified to suggest obstruction or  ileus.     Medical Consultants:    Dr. Wonda Horner, Gastroenterology.  Anti-Infectives:   None.  Subjective:   Joann Castaneda is more awake and alert and conversant today.  No complaints of pain or dyspnea.  Had some blood in the stools yesterday with Lactulose enema.  Objective:    Filed Vitals:   02/09/14 0016 02/09/14 0319 02/09/14 0400 02/09/14 0600  BP: 102/50 122/65 104/46 107/50  Pulse: 62 61 72 68  Temp:   98.1 F (36.7 C)   TempSrc:   Axillary   Resp: 14 20 14 15   Height:      Weight:   59.3 kg (130 lb 11.7 oz)   SpO2: 100% 100% 100% 100%    Intake/Output Summary (Last 24 hours) at 02/09/14 0723 Last data filed at 02/09/14 0600  Gross per 24 hour  Intake   1845 ml  Output    695 ml  Net   1150 ml    Exam: Gen:  More awake/alert Cardiovascular:  RRR, No M/R/G Respiratory:  Lungs CTAB Gastrointestinal:  Abdomen soft, NT/ND, + BS Extremities:  No C/E/C   Data Reviewed:    Labs: Basic Metabolic Panel:  Recent Labs Lab 02/06/14 1030 02/06/14 1112 02/06/14 1444 02/07/14 0403 02/08/14 0345 02/09/14 0336  NA 139 142 142 144 146 148*  K 4.1 4.0 4.1 3.8 4.1 3.5*  CL 105 112 110 115* 118* 120*  CO2 21  --  20 17* 15* 17*  GLUCOSE 122* 116* 117* 88 114* 89  BUN 31* 28* 31* 27* 28* 24*  CREATININE 1.23* 1.10 1.13* 1.03 0.98 1.05  CALCIUM 8.5  --  8.1* 8.2* 8.1* 8.2*  MG  --   --  2.1  --   --   --   PHOS  --   --  3.2  --   --   --    GFR Estimated Creatinine Clearance: 53.3 mL/min (by C-G formula based on Cr of 1.05). Liver Function Tests:  Recent Labs Lab 02/06/14 1030 02/06/14 1444 02/07/14 0403  AST 161* 136* 115*  ALT 115* 104* 87*  ALKPHOS 757* 682* 575*  BILITOT 1.6* 1.3* 1.4*  PROT 6.0 5.5* 5.2*  ALBUMIN 1.9* 1.8* 1.7*    Recent Labs Lab 02/06/14 1030 02/07/14 0354  AMMONIA 148* 160*   Coagulation profile  Recent Labs Lab 02/06/14 1030 02/06/14 1444  INR 1.10 1.04    CBC:  Recent Labs Lab 02/06/14 1030   02/06/14 1444 02/07/14 0403 02/07/14 1804 02/07/14 2220 02/08/14 0345 02/08/14 1013 02/09/14 0336  WBC 3.6*  --  2.7* 3.5*  --   --   --   --  3.5*  NEUTROABS 2.6  --  1.9  --   --   --   --   --   --   HGB 6.3*  < > 5.9* 8.5* 9.6* 8.2* 7.6* 7.6* 7.2*  HCT 20.2*  < > 18.9* 25.3* 29.5* 24.5* 24.1* 25.4* 22.2*  MCV 89.8  --  89.6 85.5  --   --   --   --  89.5  PLT 134*  --  124* 128*  --   --   --   --  135*  < > = values in this interval not displayed. Sepsis Labs:  Recent Labs Lab 02/06/14 1030 02/06/14 1112 02/06/14 1444 02/07/14 0403 02/09/14 0336  WBC 3.6*  --  2.7* 3.5* 3.5*  LATICACIDVEN  --  1.50  --   --   --    Microbiology Recent Results (from the past 240 hour(s))  MRSA PCR Screening     Status: None   Collection Time: 02/06/14  1:46 PM  Result Value Ref Range Status   MRSA by PCR NEGATIVE NEGATIVE Final    Comment:        The GeneXpert MRSA Assay (FDA approved for NASAL specimens only), is one component of a comprehensive MRSA colonization surveillance program. It is not intended to diagnose MRSA infection nor to guide or monitor treatment for MRSA infections.      Medications:   . sodium chloride  10 mL/hr Intravenous Once  . lactulose  300 mL Rectal BID  . ondansetron (ZOFRAN) IV  4 mg Intravenous 3 times per day  . pantoprazole (PROTONIX) IV  40 mg Intravenous Q12H  . sodium chloride  3 mL Intravenous Q12H   Continuous Infusions: . sodium chloride 75 mL/hr at 02/09/14 0528    Time spent: 35 minutes.  The patient is medically complex and requires high complexity decision making.    LOS: 3 days   [facility] Pager [phone]. If unable to reach me by pager, please call my cell phone at [phone].  *Please refer to [email or web address], password [email or web address] to get updated schedule on who will round on this patient, as hospitalists switch teams weekly. If 7PM-7AM, please contact night-coverage at [email or web address], password [email or web address] for any  overnight needs.  02/09/2014, 7:23 AM

## 2014-02-09 NOTE — Progress Notes (Signed)
RN gave lactulose enema to patient which resulted in a large, soft, red and brown bowel movement. Sample for Occult Blood taken and the result was positive. Pts vital signs remained the same with heart rate in the 60s-70s and blood pressure 102/50 (pt is having known hypotension while here). Dr. Paulita Fujita paged to notify and see if he wanted a hemoglobin checked sooner than with morning labs. No new orders given and to await result of morning CBC. Pt is stable. Will continue to monitor.

## 2014-02-10 DIAGNOSIS — D62 Acute posthemorrhagic anemia: Secondary | ICD-10-CM

## 2014-02-10 LAB — BASIC METABOLIC PANEL
Anion gap: 11 (ref 5–15)
BUN: 23 mg/dL (ref 6–23)
CALCIUM: 8 mg/dL — AB (ref 8.4–10.5)
CO2: 16 meq/L — AB (ref 19–32)
CREATININE: 0.99 mg/dL (ref 0.50–1.10)
Chloride: 120 mEq/L — ABNORMAL HIGH (ref 96–112)
GFR, EST AFRICAN AMERICAN: 70 mL/min — AB (ref >90)
GFR, EST NON AFRICAN AMERICAN: 61 mL/min — AB (ref >90)
Glucose, Bld: 97 mg/dL (ref 70–99)
Potassium: 4.1 mEq/L (ref 3.7–5.3)
SODIUM: 147 meq/L (ref 137–147)

## 2014-02-10 LAB — TYPE AND SCREEN
ABO/RH(D): A POS
Antibody Screen: POSITIVE
DAT, IGG: POSITIVE
Donor AG Type: NEGATIVE
Donor AG Type: NEGATIVE
Donor AG Type: NEGATIVE
PT AG TYPE: NEGATIVE
UNIT DIVISION: 0
UNIT DIVISION: 0
Unit division: 0

## 2014-02-10 LAB — GLUCOSE, CAPILLARY
GLUCOSE-CAPILLARY: 85 mg/dL (ref 70–99)
Glucose-Capillary: 103 mg/dL — ABNORMAL HIGH (ref 70–99)

## 2014-02-10 LAB — PREPARE RBC (CROSSMATCH)

## 2014-02-10 LAB — HEMOGLOBIN AND HEMATOCRIT, BLOOD
HCT: 21.9 % — ABNORMAL LOW (ref 36.0–46.0)
HEMATOCRIT: 28.2 % — AB (ref 36.0–46.0)
HEMOGLOBIN: 7 g/dL — AB (ref 12.0–15.0)
HEMOGLOBIN: 9.2 g/dL — AB (ref 12.0–15.0)

## 2014-02-10 LAB — PROTIME-INR
INR: 1.23 (ref 0.00–1.49)
Prothrombin Time: 15.7 seconds — ABNORMAL HIGH (ref 11.6–15.2)

## 2014-02-10 MED ORDER — SODIUM CHLORIDE 0.9 % IV SOLN: Freq: Once | INTRAVENOUS | Status: DC

## 2014-02-10 NOTE — Progress Notes (Signed)
[facility] Progress Note  Subjective: There is no report of hematemesis overnight or today. She has no complaints of abdominal pain at this time. Per review and discussion with family she was diagnosed recently by EGD with radiation Gastro duodenitis at [facility]. She underwent radiation therapy for cholangiocarcinoma. She also has small esophageal varices. The plan per Dr. Paulita Fujita was to continue medical therapy and follow H&H for now.  o.bjective: Vital signs in last 24 hours: Temp:  [97.6 F (36.4 C)-99 F (37.2 C)] 97.9 F (36.6 C) (12/21 0800) Pulse Rate:  [57-87] 57 (12/21 1000) Resp:  [13-23] 19 (12/21 1000) BP: (95-129)/(47-77) 122/62 mmHg (12/21 1000) SpO2:  [92 %-100 %] 92 % (12/21 1000) Weight:  [59.4 kg (130 lb 15.3 oz)] 59.4 kg (130 lb 15.3 oz) (12/21 0455) Weight change: 0.1 kg (3.5 oz)   PE:  She is in no distress  Heart regular rhythm no murmurs  Lungs clear  Abdomen: Soft and nontender  Lab Results: Results for orders placed or performed during the hospital encounter of 02/06/14 (from the past 24 hour(s))  Glucose, capillary     Status: Abnormal   Collection Time: 02/09/14 12:03 PM  Result Value Ref Range   Glucose-Capillary 115 (H) 70 - 99 mg/dL   Comment 1 Documented in Chart    Comment 2 Notify RN   Hemoglobin and hematocrit, blood     Status: Abnormal   Collection Time: 02/09/14  6:38 PM  Result Value Ref Range   Hemoglobin 8.9 (L) 12.0 - 15.0 g/dL   HCT 27.3 (L) 36.0 - 97.0 %  Basic metabolic panel     Status: Abnormal   Collection Time: 02/10/14  3:59 AM  Result Value Ref Range   Sodium 147 137 - 147 mEq/L   Potassium 4.1 3.7 - 5.3 mEq/L   Chloride 120 (H) 96 - 112 mEq/L   CO2 16 (L) 19 - 32 mEq/L   Glucose, Bld 97 70 - 99 mg/dL   BUN 23 6 - 23 mg/dL   Creatinine, Ser 0.99 0.50 - 1.10 mg/dL   Calcium 8.0 (L) 8.4 - 10.5 mg/dL   GFR calc non Af Amer 61 (L) >90 mL/min   GFR calc Af Amer 70 (L) >90 mL/min   Anion gap 11 5 - 15   Hemoglobin and hematocrit, blood     Status: Abnormal   Collection Time: 02/10/14  3:59 AM  Result Value Ref Range   Hemoglobin 7.0 (L) 12.0 - 15.0 g/dL   HCT 21.9 (L) 36.0 - 46.0 %  Type and screen     Status: None (Preliminary result)   Collection Time: 02/10/14  5:15 AM  Result Value Ref Range   ABO/RH(D) A POS    Antibody Screen POS    Sample Expiration 02/13/2014    DAT, IgG POS    Antibody Identification ANTI JKA (Kidd a)    Antibody ID,T [place] NO SPECIFIC ANTIBODY DEMONSTRATED IN ELUATE    Unit Number [id number]    Blood Component Type RED CELLS,LR    Unit division 00    Status of Unit ALLOCATED    Donor AG Type NEGATIVE FOR KIDD A ANTIGEN    Transfusion Status OK TO TRANSFUSE    Crossmatch Result COMPATIBLE   Prepare RBC     Status: None   Collection Time: 02/10/14  5:15 AM  Result Value Ref Range   Order Confirmation ORDER PROCESSED BY BLOOD BANK   Protime-INR     Status: Abnormal  Collection Time: 02/10/14  7:45 AM  Result Value Ref Range   Prothrombin Time 15.7 (H) 11.6 - 15.2 seconds   INR 1.23 0.00 - 1.49  Glucose, capillary     Status: None   Collection Time: 02/10/14  7:46 AM  Result Value Ref Range   Glucose-Capillary 85 70 - 99 mg/dL    Studies/Results: No results found.    Assessment #1. Hematemesis, no evidence of hematemesis overnight or today. Clinical suspicion is that this is related to radiation gastroduodenitis.  #2. Anemia, secondary to #1  #3. History of cholangiocarcinoma with left hepatectomy and Roux-en-Y hepaticojejunostomy about 6 months ago at [facility]  #4. History of cirrhosis of the liver  #5. Altered mental status felt secondary to hepatic encephalopathy  #6. Ascites  Plan  Continue current medical therapy. Transfuse blood as needed. Continue PPI and sucralfate. Monitor H&H. If there is evidence of continued ongoing bleeding despite ongoing medical management consider repeat EGD.    Wonda Horner 02/10/2014, 10:17 AM   Lab Results  Component Value Date   HGB 7.0* 02/10/2014   HGB 8.9* 02/09/2014   HGB 7.2* 02/09/2014   HCT 21.9* 02/10/2014   HCT 27.3* 02/09/2014   HCT 22.2* 02/09/2014   ALKPHOS 575* 02/07/2014   ALKPHOS 682* 02/06/2014   ALKPHOS 757* 02/06/2014   AST 115* 02/07/2014   AST 136* 02/06/2014   AST 161* 02/06/2014   ALT 87* 02/07/2014   ALT 104* 02/06/2014   ALT 115* 02/06/2014

## 2014-02-10 NOTE — Progress Notes (Addendum)
Progress Note   Joann Castaneda JME:[id number] DOB: 01/27/1956 DOA: 02/06/2014 PCP: Cari Caraway, MD   Brief Narrative:   Joann Castaneda is an 58 y.o. female with a PMH of cholangiocarcinoma, s/p resection of her liver/gallbladder at [facility] in 04/2013, s/p radiation and chemotherapy from September to October complicated by XRT-induced gastritis, who was admitted 02/06/14 with AMS secondary to hepatic encephalopathy.  On admission, BP was 94/44, HR 62, T max 98.5 F and low at 96.7 F, oxygen saturation was 99% on room air. Blood work showed WBC count 3.6, hemoglobin 6.3, platelets 134, creatinine 1.23. Abdominal x ray showed multiple air-fluid levels without bowel dilatation.  She was given 1 units PRBC in ED.   The patient's hospital course has been complicated by ongoing GI bleeding. She has been intermittently encephalopathic. GI is following, but so far has not made the decision to do any invasive evaluations.  Assessment/Plan:   Active Problems: Acute hepatic encephalopathy secondary to upper GI bleed -Secondary to acute GI bleeding in the setting of cholangiocarcinoma.  -Ammonia level on admission 148. -Continue lactulose 30 gm BID (given by enema if too lethargic to take orally) & Carafate.  Xifaxan added. - Nadolol and spironolactone remain on hold secondary to soft BP. -Continue BID protonix given upper GI bleeding. -Status post 2 units of PRBCs with an additional unit ordered for today. -GI consultation performed by Dr.Outlaw 02/07/14, with recommendations for medical management. -On empiric Cipro for GIB in the setting of liver disease.  Anemia of chronic disease / Acute blood loss anemia -The patient has been having episodes of hematemesis and bloody stools.. -Status post a total of 2 units of PRBCs, with an additional unit ordered for today. -Continue to check serial H/H given evidence of ongoing acute GI bleeding.   Possible SBO / ileus / vomiting -Initial  abdominal films showed air-fluid levels with concern for ileus versus early small bowel obstruction. -Follow-up films showed normal bowel gas pattern.  Pancytopenia -Secondary to bone marrow suppression from malignancy. -Continue to monitor CBC daily. -Continue SCD's for DVT prophylaxis.   Acute renal failure -Likely due to dehydration versus lasix. -Resolved with gentle IV fluids and holding diuretic therapy.  Abnormal LFT's -Secondary to cholangiocarcinoma. - Abdominal ultrasound showed ascites with bile ducts of the right lobe dilated.  H/O cholangiocarcinoma -F/U [facility] post discharge.  Hypokalemia - Resolved with potassium added to IV fluids.  DVT prophylaxis:  - SCD's bilaterally.  Code Status: Full. Family Communication: Husband Jenny Reichmann) updated at the bedside. Disposition Plan: Home when stable.   IV Access:    Peripheral IV   Procedures and diagnostic studies:   Dg Abd Acute W/chest 02/06/2014 Multiple air-fluid levels without bowel dilatation. Suspect early ileus or enteritis, although early partial obstruction potentially could present in this manner. No free air. Lungs clear.   US Abdomen Port 02/07/2014: 1. Abdominal ascites. 2. Non visualization of left hepatic lobe and left lobe of liver mass. The bile ducts in the right lobe are dilated. 3. Prior cholecystectomy.    Dg Abd Portable 1v 02/07/2014: Normal bowel gas pattern. No significant bowel dilatation identified to suggest obstruction or ileus.     Medical Consultants:    Dr. Wonda Horner, Gastroenterology.  Anti-Infectives:   Cipro 02/04/14---> Rifaximin 02/04/14--->  Subjective:   Joann Castaneda remains awake and alert and conversant today, although a bit confused/slow to respond.  No complaints of pain or dyspnea.  Has had ongoing bloody stools.  Objective:  Filed Vitals:   02/10/14 0200 02/10/14 0400 02/10/14 0455 02/10/14 0600  BP: 95/54 98/53  102/53  Pulse: 78 71  73  Temp:   98.2 F (36.8 C)    TempSrc:  Oral    Resp: 16 15  15   Height:      Weight:   59.4 kg (130 lb 15.3 oz)   SpO2: 99% 100%  99%    Intake/Output Summary (Last 24 hours) at 02/10/14 0718 Last data filed at 02/10/14 0600  Gross per 24 hour  Intake   1020 ml  Output    665 ml  Net    355 ml    Exam: Gen:  Awake/alert but slow to respond Cardiovascular:  RRR, No M/R/G Respiratory:  Lungs CTAB Gastrointestinal:  Abdomen softly distended, NT, + BS Extremities:  No C/E/C   Data Reviewed:    Labs: Basic Metabolic Panel:  Recent Labs Lab 02/06/14 1444 02/07/14 0403 02/08/14 0345 02/09/14 0336 02/10/14 0359  NA 142 144 146 148* 147  K 4.1 3.8 4.1 3.5* 4.1  CL 110 115* 118* 120* 120*  CO2 20 17* 15* 17* 16*  GLUCOSE 117* 88 114* 89 97  BUN 31* 27* 28* 24* 23  CREATININE 1.13* 1.03 0.98 1.05 0.99  CALCIUM 8.1* 8.2* 8.1* 8.2* 8.0*  MG 2.1  --   --   --   --   PHOS 3.2  --   --   --   --    GFR Estimated Creatinine Clearance: 56.7 mL/min (by C-G formula based on Cr of 0.99). Liver Function Tests:  Recent Labs Lab 02/06/14 1030 02/06/14 1444 02/07/14 0403  AST 161* 136* 115*  ALT 115* 104* 87*  ALKPHOS 757* 682* 575*  BILITOT 1.6* 1.3* 1.4*  PROT 6.0 5.5* 5.2*  ALBUMIN 1.9* 1.8* 1.7*    Recent Labs Lab 02/06/14 1030 02/07/14 0354  AMMONIA 148* 160*   Coagulation profile  Recent Labs Lab 02/06/14 1030 02/06/14 1444  INR 1.10 1.04    CBC:  Recent Labs Lab 02/06/14 1030  02/06/14 1444 02/07/14 0403  02/08/14 0345 02/08/14 1013 02/09/14 0336 02/09/14 1838 02/10/14 0359  WBC 3.6*  --  2.7* 3.5*  --   --   --  3.5*  --   --   NEUTROABS 2.6  --  1.9  --   --   --   --   --   --   --   HGB 6.3*  < > 5.9* 8.5*  < > 7.6* 7.6* 7.2* 8.9* 7.0*  HCT 20.2*  < > 18.9* 25.3*  < > 24.1* 25.4* 22.2* 27.3* 21.9*  MCV 89.8  --  89.6 85.5  --   --   --  89.5  --   --   PLT 134*  --  124* 128*  --   --   --  135*  --   --   < > = values in this interval not  displayed. Sepsis Labs:  Recent Labs Lab 02/06/14 1030 02/06/14 1112 02/06/14 1444 02/07/14 0403 02/09/14 0336  WBC 3.6*  --  2.7* 3.5* 3.5*  LATICACIDVEN  --  1.50  --   --   --    Microbiology Recent Results (from the past 240 hour(s))  MRSA PCR Screening     Status: None   Collection Time: 02/06/14  1:46 PM  Result Value Ref Range Status   MRSA by PCR NEGATIVE NEGATIVE Final    Comment:  The GeneXpert MRSA Assay (FDA approved for NASAL specimens only), is one component of a comprehensive MRSA colonization surveillance program. It is not intended to diagnose MRSA infection nor to guide or monitor treatment for MRSA infections.      Medications:   . sodium chloride  10 mL/hr Intravenous Once  . sodium chloride   Intravenous Once  . ciprofloxacin  500 mg Oral 2 times per day  . lactulose  30 g Oral BID  . ondansetron (ZOFRAN) IV  4 mg Intravenous 3 times per day  . pantoprazole (PROTONIX) IV  40 mg Intravenous Q12H  . rifaximin  550 mg Oral BID  . sodium chloride  3 mL Intravenous Q12H  . sucralfate  1 g Oral Daily  . sucralfate  1 g Oral TID WC   Continuous Infusions: . 0.9 % NaCl with KCl 40 mEq / L 10 mL/hr (02/09/14 1216)    Time spent: 35 minutes.  The patient is medically complex and requires high complexity decision making.    LOS: 4 days   [facility] Pager [phone]. If unable to reach me by pager, please call my cell phone at [phone].  *Please refer to [email or web address], password [email or web address] to get updated schedule on who will round on this patient, as hospitalists switch teams weekly. If 7PM-7AM, please contact night-coverage at [email or web address], password [email or web address] for any overnight needs.  02/10/2014, 7:18 AM

## 2014-02-10 NOTE — Progress Notes (Signed)
Kathline Magic with [facility] called about patients morning Hgb result of 7.0. A drop from 8.9 that resulted at 1838 on 12/20. Pt has had changes in mental status after lactulose enema administration around midnight tonight. Pt was too lethergic to take PO lactulose earlier in night, so Kathline Magic ordered a one time lactulose enema. Pt had dark red/brown bowel movement after enema expelled. This is not a new finding. But during morning hours, pt mental status has continued to change. Pt occasionally responds to voice, but more often responds to pain. Pt will respond, but will fall back asleep. Kathline Magic aware. Order for one unit of PRBCs to be transfused. Waiting for blood to be ready to administer. Will continue to monitor.

## 2014-02-11 LAB — CBC
HCT: 26.4 % — ABNORMAL LOW (ref 36.0–46.0)
Hemoglobin: 8.6 g/dL — ABNORMAL LOW (ref 12.0–15.0)
MCH: 28.2 pg (ref 26.0–34.0)
MCHC: 32.6 g/dL (ref 30.0–36.0)
MCV: 86.6 fL (ref 78.0–100.0)
PLATELETS: 128 10*3/uL — AB (ref 150–400)
RBC: 3.05 MIL/uL — ABNORMAL LOW (ref 3.87–5.11)
RDW: 17.1 % — AB (ref 11.5–15.5)
WBC: 4.8 10*3/uL (ref 4.0–10.5)

## 2014-02-11 NOTE — Progress Notes (Signed)
Progress Note   Joann Castaneda YTK:[id number] DOB: 10/07/1955 DOA: 02/06/2014 PCP: Cari Caraway, MD   Brief Narrative:   Joann Castaneda is an 58 y.o. female with a PMH of cholangiocarcinoma, s/p resection of her liver/gallbladder at [facility] in 04/2013, s/p radiation and chemotherapy from September to October complicated by XRT-induced gastritis, who was admitted 02/06/14 with AMS secondary to hepatic encephalopathy.  On admission, BP was 94/44, HR 62, T max 98.5 F and low at 96.7 F, oxygen saturation was 99% on room air. Blood work showed WBC count 3.6, hemoglobin 6.3, platelets 134, creatinine 1.23. Abdominal x ray showed multiple air-fluid levels without bowel dilatation.  She was given 1 units PRBC in ED.   The patient's hospital course has been complicated by ongoing GI bleeding. She has been intermittently encephalopathic. GI is following, but so far has not made the decision to do any invasive evaluations.  Assessment/Plan:   Active Problems: Acute hepatic encephalopathy secondary to upper GI bleed -Secondary to acute GI bleeding in the setting of cholangiocarcinoma. Ammonia level on admission 148. -Continue lactulose 30 gm BID (given by enema if too lethargic to take orally) & Carafate.  Xifaxan added. - Nadolol and spironolactone remain on hold secondary to soft BP. -Continue BID protonix given upper GI bleeding. -Status post 3 units of PRBCs.  Hemoglobin stable this a.m. -GI consultation performed by Dr.Outlaw 02/07/14, with recommendations for medical management. -On empiric Cipro for GIB in the setting of liver disease.  Anemia of chronic disease / Acute blood loss anemia -The patient has been having episodes of hematemesis and bloody stools.. -Status post a total of 3 units of PRBCs. -Continue to check serial H/H given evidence of ongoing acute GI bleeding.   Possible SBO / ileus / vomiting -Initial abdominal films showed air-fluid levels with concern for ileus  versus early small bowel obstruction. -Follow-up films showed normal bowel gas pattern.  Pancytopenia -Secondary to bone marrow suppression from malignancy. -Continue to monitor CBC daily. -Continue SCD's for DVT prophylaxis.   Acute renal failure -Likely due to dehydration versus lasix. -Resolved with gentle IV fluids and holding diuretic therapy.  Abnormal LFT's -Secondary to cholangiocarcinoma. - Abdominal ultrasound showed ascites with bile ducts of the right lobe dilated.  H/O cholangiocarcinoma -F/U [facility] post discharge.  Hypokalemia - Resolved with potassium added to IV fluids.  DVT prophylaxis:  - SCD's bilaterally.  Code Status: Full. Family Communication: Husband Jenny Reichmann) updated at the bedside. Disposition Plan: Home when stable.   IV Access:    Peripheral IV   Procedures and diagnostic studies:   Dg Abd Acute W/chest 02/06/2014 Multiple air-fluid levels without bowel dilatation. Suspect early ileus or enteritis, although early partial obstruction potentially could present in this manner. No free air. Lungs clear.   US Abdomen Port 02/07/2014: 1. Abdominal ascites. 2. Non visualization of left hepatic lobe and left lobe of liver mass. The bile ducts in the right lobe are dilated. 3. Prior cholecystectomy.    Dg Abd Portable 1v 02/07/2014: Normal bowel gas pattern. No significant bowel dilatation identified to suggest obstruction or ileus.     Medical Consultants:    Dr. Wonda Horner, Gastroenterology.  Anti-Infectives:   Cipro 02/04/14---> Rifaximin 02/04/14--->  Subjective:   Joann Castaneda remains awake and alert and conversant today, although a bit confused/slow to respond.  No complaints of pain or dyspnea.  Has had ongoing bloody stools.  Objective:    Filed Vitals:   02/11/14 0353 02/11/14 0400 02/11/14  0457 02/11/14 0600  BP:    136/76  Pulse: 90   84  Temp:      TempSrc:      Resp:  20  20  Height:      Weight:   60.5 kg (133  lb 6.1 oz)   SpO2: 100%   100%    Intake/Output Summary (Last 24 hours) at 02/11/14 8182 Last data filed at 02/11/14 0600  Gross per 24 hour  Intake 1429.83 ml  Output    565 ml  Net 864.83 ml    Exam: Gen:  Awake/alert but slow to respond Cardiovascular:  RRR, No M/R/G Respiratory:  Lungs CTAB Gastrointestinal:  Abdomen softly distended, NT, + BS Extremities:  No C/E/C   Data Reviewed:    Labs: Basic Metabolic Panel:  Recent Labs Lab 02/06/14 1444 02/07/14 0403 02/08/14 0345 02/09/14 0336 02/10/14 0359  NA 142 144 146 148* 147  K 4.1 3.8 4.1 3.5* 4.1  CL 110 115* 118* 120* 120*  CO2 20 17* 15* 17* 16*  GLUCOSE 117* 88 114* 89 97  BUN 31* 27* 28* 24* 23  CREATININE 1.13* 1.03 0.98 1.05 0.99  CALCIUM 8.1* 8.2* 8.1* 8.2* 8.0*  MG 2.1  --   --   --   --   PHOS 3.2  --   --   --   --    GFR Estimated Creatinine Clearance: 57.7 mL/min (by C-G formula based on Cr of 0.99). Liver Function Tests:  Recent Labs Lab 02/06/14 1030 02/06/14 1444 02/07/14 0403  AST 161* 136* 115*  ALT 115* 104* 87*  ALKPHOS 757* 682* 575*  BILITOT 1.6* 1.3* 1.4*  PROT 6.0 5.5* 5.2*  ALBUMIN 1.9* 1.8* 1.7*    Recent Labs Lab 02/06/14 1030 02/07/14 0354  AMMONIA 148* 160*   Coagulation profile  Recent Labs Lab 02/06/14 1030 02/06/14 1444 02/10/14 0745  INR 1.10 1.04 1.23    CBC:  Recent Labs Lab 02/06/14 1030  02/06/14 1444 02/07/14 0403  02/09/14 0336 02/09/14 1838 02/10/14 0359 02/10/14 1756 02/11/14 0357  WBC 3.6*  --  2.7* 3.5*  --  3.5*  --   --   --  4.8  NEUTROABS 2.6  --  1.9  --   --   --   --   --   --   --   HGB 6.3*  < > 5.9* 8.5*  < > 7.2* 8.9* 7.0* 9.2* 8.6*  HCT 20.2*  < > 18.9* 25.3*  < > 22.2* 27.3* 21.9* 28.2* 26.4*  MCV 89.8  --  89.6 85.5  --  89.5  --   --   --  86.6  PLT 134*  --  124* 128*  --  135*  --   --   --  128*  < > = values in this interval not displayed. Sepsis Labs:  Recent Labs Lab 02/06/14 1112 02/06/14 1444  02/07/14 0403 02/09/14 0336 02/11/14 0357  WBC  --  2.7* 3.5* 3.5* 4.8  LATICACIDVEN 1.50  --   --   --   --    Microbiology Recent Results (from the past 240 hour(s))  MRSA PCR Screening     Status: None   Collection Time: 02/06/14  1:46 PM  Result Value Ref Range Status   MRSA by PCR NEGATIVE NEGATIVE Final    Comment:        The GeneXpert MRSA Assay (FDA approved for NASAL specimens only), is one component of a  comprehensive MRSA colonization surveillance program. It is not intended to diagnose MRSA infection nor to guide or monitor treatment for MRSA infections.      Medications:   . sodium chloride  10 mL/hr Intravenous Once  . sodium chloride   Intravenous Once  . ciprofloxacin  500 mg Oral 2 times per day  . lactulose  30 g Oral BID  . ondansetron (ZOFRAN) IV  4 mg Intravenous 3 times per day  . pantoprazole (PROTONIX) IV  40 mg Intravenous Q12H  . rifaximin  550 mg Oral BID  . sodium chloride  3 mL Intravenous Q12H  . sucralfate  1 g Oral Daily  . sucralfate  1 g Oral TID WC   Continuous Infusions: . 0.9 % NaCl with KCl 40 mEq / L 10 mL/hr at 02/10/14 0700    Time spent: 35 minutes.  The patient is medically complex and requires high complexity decision making.    LOS: 5 days   [facility] Pager [phone]. If unable to reach me by pager, please call my cell phone at [phone].  *Please refer to [email or web address], password [email or web address] to get updated schedule on who will round on this patient, as hospitalists switch teams weekly. If 7PM-7AM, please contact night-coverage at [email or web address], password [email or web address] for any overnight needs.  02/11/2014, 7:22 AM

## 2014-02-11 NOTE — Care Management Note (Signed)
    Page 1 of 1   02/11/2014     11:38:22 AM CARE MANAGEMENT NOTE 02/11/2014  Patient:  KYM, SCANNELL   Account Number:  [id number]  Date Initiated:  02/07/2014  Documentation initiated by:  Olga Coaster  Subjective/Objective Assessment:   ADMITTED WITH ACUTE HEAPTIC ENCEPHALOPATHY     Action/Plan:   CM FOLLOWING FOR DCP   Anticipated DC Date:  02/14/2014   Anticipated DC Plan:  [place]  CM consult      Choice offered to / List presented to:             Status of service:  In process, will continue to follow Medicare Important Message given?   (If response is "NO", the following Medicare IM given date fields will be blank) Date Medicare IM given:   Medicare IM given by:   Date Additional Medicare IM given:   Additional Medicare IM given by:    Discharge Disposition:    Per UR Regulation:  Reviewed for med. necessity/level of care/duration of stay  If discussed at [place] of Stay Meetings, dates discussed:   02/11/2014    Comments:  02/11/14 Dessa Phi RN BSN NCM [phone] Transferred to floor today.D/C plan home when stable.  02/10/14 Dessa Phi RN BSN NCM [phone] Anemia-s/p 3uprbc,increased confusion-lactulose.dc plan home when stable.  12/18/2015Mindi Slicker RN,BSN,MHA [phone]

## 2014-02-11 NOTE — Progress Notes (Signed)
[facility] Progress Note  Subjective: No complaints. No report of active bleeding overnight.  Objective: Vital signs in last 24 hours: Temp:  [97.9 F (36.6 C)-99.3 F (37.4 C)] 97.9 F (36.6 C) (12/22 0800) Pulse Rate:  [70-115] 115 (12/22 1000) Resp:  [16-23] 23 (12/22 1000) BP: (112-139)/(52-98) 130/98 mmHg (12/22 1000) SpO2:  [91 %-100 %] 100 % (12/22 1000) Weight:  [60.5 kg (133 lb 6.1 oz)] 60.5 kg (133 lb 6.1 oz) (12/22 0457) Weight change: 1.1 kg (2 lb 6.8 oz)   PE  In no distress Abdomen non tender but distended  Lab Results: Results for orders placed or performed during the hospital encounter of 02/06/14 (from the past 24 hour(s))  Hemoglobin and hematocrit, blood     Status: Abnormal   Collection Time: 02/10/14  5:56 PM  Result Value Ref Range   Hemoglobin 9.2 (L) 12.0 - 15.0 g/dL   HCT 28.2 (L) 36.0 - 46.0 %  CBC     Status: Abnormal   Collection Time: 02/11/14  3:57 AM  Result Value Ref Range   WBC 4.8 4.0 - 10.5 K/uL   RBC 3.05 (L) 3.87 - 5.11 MIL/uL   Hemoglobin 8.6 (L) 12.0 - 15.0 g/dL   HCT 26.4 (L) 36.0 - 46.0 %   MCV 86.6 78.0 - 100.0 fL   MCH 28.2 26.0 - 34.0 pg   MCHC 32.6 30.0 - 36.0 g/dL   RDW 17.1 (H) 11.5 - 15.5 %   Platelets 128 (L) 150 - 400 K/uL    Studies/Results: No results found.    Assessment: GI bleed from gastroduodenitis from radiation  Plan: Continue current plan.    Wonda Horner 02/11/2014, 12:04 PM  Lab Results  Component Value Date   HGB 8.6* 02/11/2014   HGB 9.2* 02/10/2014   HGB 7.0* 02/10/2014   HCT 26.4* 02/11/2014   HCT 28.2* 02/10/2014   HCT 21.9* 02/10/2014   ALKPHOS 575* 02/07/2014   ALKPHOS 682* 02/06/2014   ALKPHOS 757* 02/06/2014   AST 115* 02/07/2014   AST 136* 02/06/2014   AST 161* 02/06/2014   ALT 87* 02/07/2014   ALT 104* 02/06/2014   ALT 115* 02/06/2014

## 2014-02-12 LAB — CBC
HCT: 23.6 % — ABNORMAL LOW (ref 36.0–46.0)
Hemoglobin: 7.9 g/dL — ABNORMAL LOW (ref 12.0–15.0)
MCH: 29.2 pg (ref 26.0–34.0)
MCHC: 33.5 g/dL (ref 30.0–36.0)
MCV: 87.1 fL (ref 78.0–100.0)
Platelets: 121 10*3/uL — ABNORMAL LOW (ref 150–400)
RBC: 2.71 MIL/uL — ABNORMAL LOW (ref 3.87–5.11)
RDW: 17.2 % — AB (ref 11.5–15.5)
WBC: 3.8 10*3/uL — ABNORMAL LOW (ref 4.0–10.5)

## 2014-02-12 LAB — PREPARE RBC (CROSSMATCH)

## 2014-02-12 MED ORDER — SODIUM CHLORIDE 0.9 % IV SOLN
Freq: Once | INTRAVENOUS | Status: AC
  Administered 2014-02-12: 12:00:00 via INTRAVENOUS

## 2014-02-12 MED ORDER — SUCRALFATE 1 GM/10ML PO SUSP: 1.0000 g | Freq: Three times a day (TID) | ORAL | Status: DC

## 2014-02-12 MED ORDER — FUROSEMIDE 20 MG PO TABS: 20.0000 mg | ORAL_TABLET | Freq: Every day | ORAL | Status: DC

## 2014-02-12 MED ORDER — CIPROFLOXACIN HCL 500 MG PO TABS: 500.0000 mg | ORAL_TABLET | Freq: Every day | ORAL | Status: DC

## 2014-02-12 MED ORDER — SPIRONOLACTONE 50 MG PO TABS: 50.0000 mg | ORAL_TABLET | Freq: Every day | ORAL | Status: AC

## 2014-02-12 MED ORDER — LACTULOSE 10 GM/15ML PO SOLN: 10.0000 g | Freq: Two times a day (BID) | ORAL | Status: DC

## 2014-02-12 MED ORDER — PANTOPRAZOLE SODIUM 40 MG PO TBEC: 40.0000 mg | DELAYED_RELEASE_TABLET | Freq: Two times a day (BID) | ORAL | Status: AC

## 2014-02-12 MED ORDER — RIFAXIMIN 550 MG PO TABS
550.0000 mg | ORAL_TABLET | Freq: Two times a day (BID) | ORAL | Status: AC
Start: 2014-02-12 — End: ?

## 2014-02-12 MED ORDER — PANTOPRAZOLE SODIUM 40 MG PO TBEC
40.0000 mg | DELAYED_RELEASE_TABLET | Freq: Once | ORAL | Status: AC
  Administered 2014-02-12: 40 mg via ORAL
  Filled 2014-02-12: qty 1

## 2014-02-12 NOTE — Discharge Summary (Signed)
Physician Discharge Summary  Joann Castaneda KCL:[id number] DOB: 02-May-1955 DOA: 02/06/2014  PCP: Cari Caraway, MD  Admit date: 02/06/2014 Discharge date: 02/12/2014  Recommendations for Outpatient Follow-up:  1. Please take ciprofloxacin for 6 more days on discharge as prescribed. 2. Home health order placed for blood work to be done on Thursday and Saturday and report results to GI physician on call.  Discharge Diagnoses:  Principal Problem:   Anemia of chronic disease Active Problems:   Acute hepatic encephalopathy   Antineoplastic chemotherapy induced pancytopenia   Acute renal failure   Abnormal LFTs   Cholangiocarcinoma   Acute upper GI bleed   Ileus   Vomiting   Hypokalemia   Acute blood loss anemia    Discharge Condition: stable; patient insists on going home today. She will receive 1 unit of PRBC prior to discharge.  Diet recommendation: as tolerated   History of present illness:  58 y.o. female with a PMH of cholangiocarcinoma, s/p resection of her liver/gallbladder at [facility] in 04/2013, s/p radiation and chemotherapy from September to October complicated by XRT-induced gastritis, who was admitted 02/06/14 with AMS secondary to hepatic encephalopathy.  On admission, BP was 94/44, HR 62, T max 98.5 F and low at 96.7 F, oxygen saturation was 99% on room air. Blood work showed WBC count 3.6, hemoglobin 6.3, platelets 134, creatinine 1.23. Abdominal x ray showed multiple air-fluid levels without bowel dilatation. She was given 1 units PRBC in ED.   The patient's hospital course has been complicated by ongoing GI bleeding. She has been intermittently encephalopathic. GI is following, but so far has not made the decision to do any invasive evaluations.  Assessment/Plan:   Active Problems: Acute hepatic encephalopathy secondary to upper GI bleed -Secondary to acute GI bleeding in the setting of cholangiocarcinoma. Ammonia level on admission 148. -Continue lactulose  30 gm BID (given by enema if too lethargic to take orally) & Carafate. Xifaxan added. - Nadolol and spironolactone remain on hold secondary to soft BP in hospital. Per GI patient is fine to continue taking spironolactone 50 mg daily. Patient will also see resume taking Lasix 20 mg daily -Continue BID protonix given upper GI bleeding. -Status post 3 units of PRBCs. Patient will receive 1 unit of PRBC prior to discharge. -GI consultation performed by Dr.Outlaw 02/07/14, with recommendations for medical management. -On empiric Cipro for GIB in the setting of liver disease. She will continue 6 more days of Cipro on discharge which would complete a total of 2 week treatment for possible intra-abdominal infection in the setting of liver disease.  Anemia of chronic disease / Acute blood loss anemia -The patient has been having episodes of hematemesis and bloody stools.. -Status post a total of 4 units of PRBCs since the admission until discharge.  Possible SBO / ileus / vomiting -Initial abdominal films showed air-fluid levels with concern for ileus versus early small bowel obstruction. -Follow-up films showed normal bowel gas pattern.  Pancytopenia -Secondary to bone marrow suppression from malignancy. -Continue SCD's for DVT prophylaxis.   Acute renal failure -Likely due to dehydration versus lasix. -Resolved with gentle IV fluids and holding diuretic therapy. -Per GI recommendations, it is okay to resume Lasix 20 mg daily.  Abnormal LFT's -Secondary to cholangiocarcinoma. - Abdominal ultrasound showed ascites with bile ducts of the right lobe dilated.  H/O cholangiocarcinoma -F/U [facility] post discharge.  Hypokalemia - Resolved with potassium added to IV fluids.  DVT prophylaxis:  - SCD's bilaterally.  Code Status: Full. Family Communication: Husband (  John) updated at the bedside.    IV Access:    Peripheral IV   Procedures and diagnostic studies:   Dg Abd Acute  W/chest 02/13/14 Multiple air-fluid levels without bowel dilatation. Suspect early ileus or enteritis, although early partial obstruction potentially could present in this manner. No free air. Lungs clear.   US Abdomen Port 02/07/2014: 1. Abdominal ascites. 2. Non visualization of left hepatic lobe and left lobe of liver mass. The bile ducts in the right lobe are dilated. 3. Prior cholecystectomy.   Dg Abd Portable 1v 02/07/2014: Normal bowel gas pattern. No significant bowel dilatation identified to suggest obstruction or ileus.   Medical Consultants:    Dr. Wonda Horner, Gastroenterology.  Anti-Infectives:    Cipro 02/04/14---> for 6 days on discharge   Rifaximin 02/04/14--->    Signed:  Leisa Lenz, MD  [facility] 02/12/2014, 11:30 AM  Pager #: [phone]    Discharge Exam: Filed Vitals:   02/12/14 0425  BP: 127/64  Pulse: 98  Temp: 98.1 F (36.7 C)  Resp: 16   Filed Vitals:   02/11/14 1419 02/11/14 2120 02/11/14 2159 02/12/14 0425  BP: 136/75  126/70 127/64  Pulse: 94 90 102 98  Temp: 97.7 F (36.5 C)  98 F (36.7 C) 98.1 F (36.7 C)  TempSrc: Oral  Oral Oral  Resp: 16  15 16   Height:      Weight:      SpO2: 100%  100% 100%    General: Pt is alert, follows commands appropriately, not in acute distress Cardiovascular: Regular rate and rhythm, S1/S2 +, no murmurs Respiratory: Clear to auscultation bilaterally, no wheezing, no crackles, no rhonchi Abdominal: Soft, non tender, non distended, bowel sounds +, no guarding Extremities: no edema, no cyanosis, pulses palpable bilaterally DP and PT Neuro: Grossly nonfocal  Discharge Instructions  Discharge Instructions    Call MD for:  difficulty breathing, headache or visual disturbances    Complete by:  As directed      Call MD for:  persistant dizziness or light-headedness    Complete by:  As directed      Call MD for:  persistant nausea and vomiting    Complete by:  As  directed      Call MD for:  severe uncontrolled pain    Complete by:  As directed      Diet - low sodium heart healthy    Complete by:  As directed      Discharge instructions    Complete by:  As directed   1. Please take ciprofloxacin for 6 more days on discharge as prescribed. 2. Home health order placed for blood work to be done on Thursday and Saturday and report results to GI physician on call.     Increase activity slowly    Complete by:  As directed             Medication List    STOP taking these medications        nadolol 20 MG tablet  Commonly known as:  CORGARD      TAKE these medications        acetaminophen 325 MG tablet  Commonly known as:  TYLENOL  Take 650 mg by mouth every 4 (four) hours as needed for moderate pain.     ALIGN 4 MG Caps  Take 4 mg by mouth daily.     ciprofloxacin 500 MG tablet  Commonly known as:  CIPRO  Take 1 tablet (500 mg  total) by mouth daily with breakfast.     furosemide 20 MG tablet  Commonly known as:  LASIX  Take 1 tablet (20 mg total) by mouth daily.     lactulose 10 GM/15ML solution  Commonly known as:  CHRONULAC  Take 15 mLs (10 g total) by mouth 2 (two) times daily.     ondansetron 8 MG disintegrating tablet  Commonly known as:  ZOFRAN-ODT  Take 8 mg by mouth every 8 (eight) hours as needed for nausea or vomiting.     pantoprazole 40 MG tablet  Commonly known as:  PROTONIX  Take 1 tablet (40 mg total) by mouth every 12 (twelve) hours.     rifaximin 550 MG Tabs tablet  Commonly known as:  XIFAXAN  Take 1 tablet (550 mg total) by mouth 2 (two) times daily.     sertraline 25 MG tablet  Commonly known as:  ZOLOFT  Take 25 mg by mouth every morning.     spironolactone 50 MG tablet  Commonly known as:  ALDACTONE  Take 1 tablet (50 mg total) by mouth daily.     sucralfate 1 GM/10ML suspension  Commonly known as:  CARAFATE  Take 10 mLs (1 g total) by mouth 3 (three) times daily with meals.            Follow-up Information    Follow up with MCNEILL,WENDY, MD. Schedule an appointment as soon as possible for a visit in 2 weeks.   Specialty:  Family Medicine   Why:  Follow up appt after recent hospitalization   Contact information:   [place] [id number] [phone]        The results of significant diagnostics from this hospitalization (including imaging, microbiology, ancillary and laboratory) are listed below for reference.    Significant Diagnostic Studies: US Abdomen Port  02/07/2014   CLINICAL DATA:  Abnormal LFTs.  EXAM: ULTRASOUND PORTABLE ABDOMEN  COMPARISON:  03/23/2013  FINDINGS: Gallbladder: Previous cholecystectomy.  Common bile duct: Diameter: 3 mm  Liver: The liver has a heterogeneous echotexture. The bile ducts in the right hepatic lobe are dilated. Left hepatic lobe is not well visualized.  IVC: No abnormality visualized.  Pancreas: Visualized portion unremarkable.  Spleen: Measures 11.1 cm in length.  Right Kidney: Length: 11 cm. Echogenicity within normal limits. No mass or hydronephrosis visualized.  Left Kidney: Length: 10.2 cm. Echogenicity within normal limits. No mass or hydronephrosis visualized.  Abdominal aorta: No aneurysm visualized.  Other findings: Ascites is identified. There is a left pleural effusion.  IMPRESSION: 1. Abdominal ascites. 2. Non visualization of left hepatic lobe and left lobe of liver mass. The bile ducts in the right lobe are dilated. 3. Prior cholecystectomy.   Electronically Signed   By: Kerby Moors M.D.   On: 02/07/2014 13:32   Dg Abd Acute W/chest  02/06/2014   CLINICAL DATA:  Vomiting blood.  EXAM: ACUTE ABDOMEN SERIES (ABDOMEN 2 VIEW & CHEST 1 VIEW)  COMPARISON:  Chest radiograph October 28, 2013. MR abdomen March 23, 2013  FINDINGS: PA chest: There is no edema or consolidation. Heart size and pulmonary vascularity are normal. No adenopathy. No bone lesions.  Supine and left lateral decubitus abdomen: There are  multiple surgical clips in the right abdomen. There is no appreciable bowel dilatation. However, there are multiple air-fluid levels throughout the abdomen. No free air. No abnormal calcifications. There is moderate stool throughout the colon.  IMPRESSION: Multiple air-fluid levels without bowel dilatation. Suspect early  ileus or enteritis, although early partial obstruction potentially could present in this manner. No free air. Lungs clear.   Electronically Signed   By: Lowella Grip M.D.   On: 02/06/2014 11:32   Dg Abd Portable 1v  02/07/2014   CLINICAL DATA:  Followup ileus  EXAM: PORTABLE ABDOMEN - 1 VIEW  COMPARISON:  02/06/2014.  FINDINGS: The bowel gas pattern is normal. No radio-opaque calculi or other significant radiographic abnormality are seen.  IMPRESSION: Normal bowel gas pattern. No significant bowel dilatation identified to suggest obstruction or ileus.   Electronically Signed   By: Kerby Moors M.D.   On: 02/07/2014 11:08    Microbiology: Recent Results (from the past 240 hour(s))  MRSA PCR Screening     Status: None   Collection Time: 02/06/14  1:46 PM  Result Value Ref Range Status   MRSA by PCR NEGATIVE NEGATIVE Final    Comment:        The GeneXpert MRSA Assay (FDA approved for NASAL specimens only), is one component of a comprehensive MRSA colonization surveillance program. It is not intended to diagnose MRSA infection nor to guide or monitor treatment for MRSA infections.      Labs: Basic Metabolic Panel:  Recent Labs Lab 02/06/14 1444 02/07/14 0403 02/08/14 0345 02/09/14 0336 02/10/14 0359  NA 142 144 146 148* 147  K 4.1 3.8 4.1 3.5* 4.1  CL 110 115* 118* 120* 120*  CO2 20 17* 15* 17* 16*  GLUCOSE 117* 88 114* 89 97  BUN 31* 27* 28* 24* 23  CREATININE 1.13* 1.03 0.98 1.05 0.99  CALCIUM 8.1* 8.2* 8.1* 8.2* 8.0*  MG 2.1  --   --   --   --   PHOS 3.2  --   --   --   --    Liver Function Tests:  Recent Labs Lab 02/06/14 1030 02/06/14 1444  02/07/14 0403  AST 161* 136* 115*  ALT 115* 104* 87*  ALKPHOS 757* 682* 575*  BILITOT 1.6* 1.3* 1.4*  PROT 6.0 5.5* 5.2*  ALBUMIN 1.9* 1.8* 1.7*   No results for input(s): LIPASE, AMYLASE in the last 168 hours.  Recent Labs Lab 02/06/14 1030 02/07/14 0354  AMMONIA 148* 160*   CBC:  Recent Labs Lab 02/06/14 1030  02/06/14 1444 02/07/14 0403  02/09/14 0336 02/09/14 1838 02/10/14 0359 02/10/14 1756 02/11/14 0357 02/12/14 0410  WBC 3.6*  --  2.7* 3.5*  --  3.5*  --   --   --  4.8 3.8*  NEUTROABS 2.6  --  1.9  --   --   --   --   --   --   --   --   HGB 6.3*  < > 5.9* 8.5*  < > 7.2* 8.9* 7.0* 9.2* 8.6* 7.9*  HCT 20.2*  < > 18.9* 25.3*  < > 22.2* 27.3* 21.9* 28.2* 26.4* 23.6*  MCV 89.8  --  89.6 85.5  --  89.5  --   --   --  86.6 87.1  PLT 134*  --  124* 128*  --  135*  --   --   --  128* 121*  < > = values in this interval not displayed. Cardiac Enzymes: No results for input(s): CKTOTAL, CKMB, CKMBINDEX, TROPONINI in the last 168 hours. BNP: BNP (last 3 results) No results for input(s): PROBNP in the last 8760 hours. CBG:  Recent Labs Lab 02/08/14 0730 02/09/14 0804 02/09/14 1203 02/10/14 0746 02/10/14 1159  GLUCAP 106*  75 115* 85 103*    Time coordinating discharge: Over 30 minutes

## 2014-02-12 NOTE — Care Management Note (Signed)
CARE MANAGEMENT NOTE 02/12/2014  Patient:  Joann Castaneda, Joann Castaneda   Account Number:  [id number]  Date Initiated:  02/07/2014  Documentation initiated by:  Olga Coaster  Subjective/Objective Assessment:   ADMITTED WITH ACUTE HEAPTIC ENCEPHALOPATHY     Action/Plan:   CM FOLLOWING FOR DCP   Anticipated DC Date:  02/12/2014   Anticipated DC Plan:  [place]  CM consult      [facility] Choice  HOME HEALTH   Choice offered to / List presented to:  C-4 Adult Children        HH arranged  HH-2 PT  HH-1 RN      [place].   Status of service:  In process, will continue to follow Medicare Important Message given?   (If response is "NO", the following Medicare IM given date fields will be blank) Date Medicare IM given:   Medicare IM given by:   Date Additional Medicare IM given:   Additional Medicare IM given by:    Discharge Disposition:    Per UR Regulation:  Reviewed for med. necessity/level of care/duration of stay  If discussed at [place] of Stay Meetings, dates discussed:   02/11/2014    Comments:  02/12/14 Marney Doctor RN,BSN,NCM [phone] Dr. Charlies Silvers requested pt have [facility] come to pts home tomorrow 12/24 and saturday 12/26 for CBC draws and results callded to Dr. Michail Sermon.  PT also recommended HHPT.  Pt offered choice.  [facility] rep called to give referral. Pt to be DC'd today after blood transfusion.  No other CM needs noted.  02/11/14 Dessa Phi RN BSN NCM [phone] Transferred to floor today.D/C plan home when stable.  02/10/14 Dessa Phi RN BSN NCM [phone] Anemia-s/p 3uprbc,increased confusion-lactulose.dc plan home when stable.  12/18/2015Mindi Slicker RN,BSN,MHA [phone]

## 2014-02-12 NOTE — Progress Notes (Signed)
[facility] Progress Note  Subjective: She feels fine today. Her mentation is clear today. She is having loose stools which are somewhat dark in color. She is eating. She wants to go home.  Objective: Vital signs in last 24 hours: Temp:  [97.7 F (36.5 C)-98.1 F (36.7 C)] 98.1 F (36.7 C) (12/23 0425) Pulse Rate:  [86-102] 98 (12/23 0425) Resp:  [14-16] 16 (12/23 0425) BP: (126-140)/(64-79) 127/64 mmHg (12/23 0425) SpO2:  [100 %] 100 % (12/23 0425) Weight:  [60.328 kg (133 lb)] 60.328 kg (133 lb) (12/22 1117) Weight change: -0.172 kg (-6.1 oz)   PE:  She is in no distress  Mentation is clear  Heart regular rhythm no murmurs  Lungs clear  Abdomen: Bowel sounds present, soft, ascites is present  Lab Results: Results for orders placed or performed during the hospital encounter of 02/06/14 (from the past 24 hour(s))  CBC     Status: Abnormal   Collection Time: 02/12/14  4:10 AM  Result Value Ref Range   WBC 3.8 (L) 4.0 - 10.5 K/uL   RBC 2.71 (L) 3.87 - 5.11 MIL/uL   Hemoglobin 7.9 (L) 12.0 - 15.0 g/dL   HCT 23.6 (L) 36.0 - 46.0 %   MCV 87.1 78.0 - 100.0 fL   MCH 29.2 26.0 - 34.0 pg   MCHC 33.5 30.0 - 36.0 g/dL   RDW 17.2 (H) 11.5 - 15.5 %   Platelets 121 (L) 150 - 400 K/uL    Studies/Results: No results found.    Assessment: Cirrhosis of liver  History of cholangiocarcinoma  Hepatic encephalopathy, improved  Ascites  Plan: The patient would like to go home and we talked about this. The only reason to keep her here would be to monitor her hemoglobin and hematocrit for a longer period of time. She feels like this could be done at home. We have therefore arranged for her to have a CBC tomorrow, and December 26, at either [place] or for home health to come out and draw these levels. The levels can be reported to our on call doctor. We are going to give her 1 unit of packed red cells today before discharge. I would recommend beginning Lasix 20 mg daily  and Aldactone 50 mg daily for her ascites. She will need to go home also on a PPI and Carafate as she is on here. Both she and her husband and family understand that if she has more problems that she needs to come back to the emergency room immediately. They are agreeable.    Wonda Horner 02/12/2014, 10:02 AM  Lab Results  Component Value Date   HGB 7.9* 02/12/2014   HGB 8.6* 02/11/2014   HGB 9.2* 02/10/2014   HCT 23.6* 02/12/2014   HCT 26.4* 02/11/2014   HCT 28.2* 02/10/2014   ALKPHOS 575* 02/07/2014   ALKPHOS 682* 02/06/2014   ALKPHOS 757* 02/06/2014   AST 115* 02/07/2014   AST 136* 02/06/2014   AST 161* 02/06/2014   ALT 87* 02/07/2014   ALT 104* 02/06/2014   ALT 115* 02/06/2014

## 2014-02-12 NOTE — Progress Notes (Signed)
Pt discharged home with spouse in stable condition. Discharge instructions and scripts given. Pt and spouse verbalize understanding.

## 2014-02-12 NOTE — Evaluation (Signed)
Physical Therapy Evaluation Patient Details Name: Joann Castaneda MRN: [id number] DOB: 10-24-1955 Today's Date: 02/12/2014   History of Present Illness  58 yo female admitetd with anemai, acute hep encephalopathy. Hx of cholangiocarcinoma-radiatin/chemo 10/2013-11/2013  Clinical Impression  On eval, pt was Min guard assist for mobility-able to ambulate ~150 feet without an assistive device. Tolerated fairly well despite "moderate" pain in R ankle. Pt denies fall, known injury. Slightly unsteady due to ankle pain but no overt LOB. Recommend HHPT for general strength and conditioning. Also discussed 24 hour supervision for safety, with family, especially if pt wants to ambulate outside.     Follow Up Recommendations Home health PT;Supervision/Assistance - 24 hour    Equipment Recommendations  None recommended by PT    Recommendations for Other Services       Precautions / Restrictions Precautions Precautions: Fall Restrictions Weight Bearing Restrictions: No      Mobility  Bed Mobility Overal bed mobility: Modified Independent                Transfers Overall transfer level: Needs assistance   Transfers: Sit to/from Stand Sit to Stand: Supervision            Ambulation/Gait Ambulation/Gait assistance: Min guard Ambulation Distance (Feet): 150 Feet   Gait Pattern/deviations: Antalgic;Decreased stance time - right     General Gait Details: Pt rates pain as "medium level" in R ankle. Mildly antalgic gait once distance increased. close guard for safety  Stairs Stairs: Yes   Stair Management: One rail Left;Alternating pattern;Forwards Number of Stairs: 5 General stair comments: close guard for safety  Wheelchair Mobility    Modified Rankin (Stroke Patients Only)       Balance                                             Pertinent Vitals/Pain Pain Assessment: Faces Pain Score: 5  Faces Pain Scale: Hurts little more Pain  Location: R ankle Pain Intervention(s): Monitored during session;Limited activity within patient's tolerance    Home Living Family/patient expects to be discharged to:: Private residence Living Arrangements: Spouse/significant other Available Help at Discharge: Family Type of Home: House Home Access: Stairs to enter Entrance Stairs-Rails: Right Entrance Stairs-Number of Steps: 4 Home Layout: Two level;Able to live on main level with bedroom/bathroom Home Equipment: None      Prior Function Level of Independence: Independent               Hand Dominance        Extremity/Trunk Assessment   Upper Extremity Assessment: Overall WFL for tasks assessed           Lower Extremity Assessment: Generalized weakness;RLE deficits/detail RLE Deficits / Details: noted mild ankle/foot sweeling. tenderness to palpation on medial surface of ankle. Decreased DF compared to L side.    Cervical / Trunk Assessment: Normal  Communication   Communication: No difficulties  Cognition Arousal/Alertness: Awake/alert Behavior During Therapy: WFL for tasks assessed/performed Overall Cognitive Status: Impaired/Different from baseline Area of Impairment: Memory;Following commands;Safety/judgement     Memory: Decreased short-term memory Following Commands: Follows multi-step commands inconsistently Safety/Judgement: Decreased awareness of safety          General Comments      Exercises        Assessment/Plan    PT Assessment All further PT needs can be met in the next venue  of care  PT Diagnosis Difficulty walking;Abnormality of gait;Acute pain;Generalized weakness   PT Problem List Pain;Decreased strength;Decreased range of motion;Decreased activity tolerance;Decreased balance;Decreased mobility;Decreased cognition  PT Treatment Interventions     PT Goals (Current goals can be found in the Care Plan section) Acute Rehab PT Goals Patient Stated Goal: less pain. home PT Goal  Formulation: All assessment and education complete, DC therapy    Frequency     Barriers to discharge        Co-evaluation               End of Session Equipment Utilized During Treatment: Gait belt Activity Tolerance: Patient limited by pain Patient left: in chair;with call bell/phone within reach;with family/visitor present           Time: 1032-1100 PT Time Calculation (min) (ACUTE ONLY): 28 min   Charges:   PT Evaluation $Initial PT Evaluation Tier I: 1 Procedure PT Treatments $Gait Training: 8-22 mins   PT G Codes:          Weston Anna, MPT Pager: [phone]

## 2014-02-12 NOTE — Discharge Instructions (Signed)

## 2014-02-13 LAB — TYPE AND SCREEN
ABO/RH(D): A POS
ANTIBODY SCREEN: POSITIVE
DAT, IGG: POSITIVE
DONOR AG TYPE: NEGATIVE
Donor AG Type: NEGATIVE
Unit division: 0
Unit division: 0

## 2014-04-02 ENCOUNTER — Inpatient Hospital Stay (HOSPITAL_COMMUNITY)
Admission: EM | Admit: 2014-04-02 | Discharge: 2014-04-04 | DRG: 442 | Disposition: A | Discharge status: Home / Self Care | Payer: BLUE CROSS/BLUE SHIELD | Attending: Internal Medicine | Admitting: Internal Medicine

## 2014-04-02 ENCOUNTER — Encounter (HOSPITAL_COMMUNITY): Payer: Self-pay | Admitting: Emergency Medicine

## 2014-04-02 DIAGNOSIS — D62 Acute posthemorrhagic anemia: Secondary | ICD-10-CM | POA: Diagnosis present

## 2014-04-02 DIAGNOSIS — C221 Intrahepatic bile duct carcinoma: Secondary | ICD-10-CM | POA: Diagnosis present

## 2014-04-02 DIAGNOSIS — R188 Other ascites: Secondary | ICD-10-CM

## 2014-04-02 DIAGNOSIS — Z8505 Personal history of malignant neoplasm of liver: Secondary | ICD-10-CM

## 2014-04-02 DIAGNOSIS — K921 Melena: Secondary | ICD-10-CM | POA: Diagnosis present

## 2014-04-02 DIAGNOSIS — D638 Anemia in other chronic diseases classified elsewhere: Secondary | ICD-10-CM | POA: Diagnosis present

## 2014-04-02 DIAGNOSIS — F419 Anxiety disorder, unspecified: Secondary | ICD-10-CM | POA: Diagnosis present

## 2014-04-02 DIAGNOSIS — K7682 Hepatic encephalopathy: Secondary | ICD-10-CM

## 2014-04-02 DIAGNOSIS — Z79899 Other long term (current) drug therapy: Secondary | ICD-10-CM | POA: Diagnosis not present

## 2014-04-02 DIAGNOSIS — K7290 Hepatic failure, unspecified without coma: Secondary | ICD-10-CM

## 2014-04-02 DIAGNOSIS — R740 Nonspecific elevation of levels of transaminase and lactic acid dehydrogenase [LDH]: Secondary | ICD-10-CM

## 2014-04-02 DIAGNOSIS — D500 Iron deficiency anemia secondary to blood loss (chronic): Secondary | ICD-10-CM | POA: Diagnosis not present

## 2014-04-02 DIAGNOSIS — K920 Hematemesis: Secondary | ICD-10-CM | POA: Diagnosis present

## 2014-04-02 DIAGNOSIS — K7200 Acute and subacute hepatic failure without coma: Secondary | ICD-10-CM | POA: Diagnosis present

## 2014-04-02 DIAGNOSIS — K2970 Gastritis, unspecified, without bleeding: Secondary | ICD-10-CM | POA: Diagnosis present

## 2014-04-02 DIAGNOSIS — R7401 Elevation of levels of liver transaminase levels: Secondary | ICD-10-CM | POA: Diagnosis present

## 2014-04-02 DIAGNOSIS — N179 Acute kidney failure, unspecified: Secondary | ICD-10-CM | POA: Diagnosis present

## 2014-04-02 LAB — COMPREHENSIVE METABOLIC PANEL
ALT: 74 U/L — ABNORMAL HIGH (ref 0–35)
AST: 124 U/L — ABNORMAL HIGH (ref 0–37)
Albumin: 2 g/dL — ABNORMAL LOW (ref 3.5–5.2)
Alkaline Phosphatase: 615 U/L — ABNORMAL HIGH (ref 39–117)
Anion gap: 7 (ref 5–15)
BUN: 28 mg/dL — ABNORMAL HIGH (ref 6–23)
CALCIUM: 8.2 mg/dL — AB (ref 8.4–10.5)
CHLORIDE: 108 mmol/L (ref 96–112)
CO2: 21 mmol/L (ref 19–32)
CREATININE: 1.21 mg/dL — AB (ref 0.50–1.10)
GFR, EST AFRICAN AMERICAN: 55 mL/min — AB (ref >90)
GFR, EST NON AFRICAN AMERICAN: 47 mL/min — AB (ref >90)
GLUCOSE: 102 mg/dL — AB (ref 70–99)
Potassium: 4.1 mmol/L (ref 3.5–5.1)
Sodium: 136 mmol/L (ref 135–145)
Total Bilirubin: 2.3 mg/dL — ABNORMAL HIGH (ref 0.3–1.2)
Total Protein: 5.5 g/dL — ABNORMAL LOW (ref 6.0–8.3)

## 2014-04-02 LAB — CBC WITH DIFFERENTIAL/PLATELET
Basophils Absolute: 0.1 10*3/uL (ref 0.0–0.1)
Basophils Relative: 1 % (ref 0–1)
Eosinophils Absolute: 0.1 10*3/uL (ref 0.0–0.7)
Eosinophils Relative: 2 % (ref 0–5)
HCT: 30.3 % — ABNORMAL LOW (ref 36.0–46.0)
Hemoglobin: 10 g/dL — ABNORMAL LOW (ref 12.0–15.0)
LYMPHS ABS: 1 10*3/uL (ref 0.7–4.0)
Lymphocytes Relative: 18 % (ref 12–46)
MCH: 29 pg (ref 26.0–34.0)
MCHC: 33 g/dL (ref 30.0–36.0)
MCV: 87.8 fL (ref 78.0–100.0)
Monocytes Absolute: 0.6 10*3/uL (ref 0.1–1.0)
Monocytes Relative: 12 % (ref 3–12)
NEUTROS ABS: 3.5 10*3/uL (ref 1.7–7.7)
NEUTROS PCT: 67 % (ref 43–77)
PLATELETS: 192 10*3/uL (ref 150–400)
RBC: 3.45 MIL/uL — ABNORMAL LOW (ref 3.87–5.11)
RDW: 16.1 % — ABNORMAL HIGH (ref 11.5–15.5)
WBC: 5.3 10*3/uL (ref 4.0–10.5)

## 2014-04-02 LAB — AMMONIA: AMMONIA: 134 umol/L — AB (ref 11–32)

## 2014-04-02 MED ORDER — LACTULOSE ENEMA
300.0000 mL | ORAL | Status: DC
  Administered 2014-04-02 – 2014-04-03 (×2): 300 mL via RECTAL
  Filled 2014-04-02 (×11): qty 300

## 2014-04-02 MED ORDER — SODIUM CHLORIDE 0.9 % IJ SOLN
3.0000 mL | Freq: Two times a day (BID) | INTRAMUSCULAR | Status: DC
Start: 2014-04-02 — End: 2014-04-04
  Administered 2014-04-02 – 2014-04-04 (×3): 3 mL via INTRAVENOUS

## 2014-04-02 MED ORDER — ONDANSETRON HCL 4 MG/2ML IJ SOLN: 4.0000 mg | Freq: Four times a day (QID) | INTRAMUSCULAR | Status: DC | PRN

## 2014-04-02 MED ORDER — LACTULOSE 10 GM/15ML PO SOLN
30.0000 g | Freq: Once | ORAL | Status: AC
  Administered 2014-04-02: 30 g via ORAL
  Filled 2014-04-02 (×2): qty 45

## 2014-04-02 MED ORDER — LACTULOSE 10 GM/15ML PO SOLN
30.0000 g | ORAL | Status: DC
  Administered 2014-04-02: 30 g via ORAL
  Filled 2014-04-02 (×2): qty 45

## 2014-04-02 MED ORDER — ONDANSETRON HCL 4 MG PO TABS
4.0000 mg | ORAL_TABLET | Freq: Four times a day (QID) | ORAL | Status: DC | PRN
  Administered 2014-04-03: 4 mg via ORAL
  Filled 2014-04-02: qty 1

## 2014-04-02 MED ORDER — SODIUM CHLORIDE 0.9 % IV SOLN
INTRAVENOUS | Status: AC
  Administered 2014-04-02: 16:00:00 via INTRAVENOUS

## 2014-04-02 NOTE — ED Provider Notes (Signed)
CSN: [id number]     Arrival date & time 04/02/14  1107 History   First MD Initiated Contact with Patient 04/02/14 1129     Chief Complaint  Patient presents with  . 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 She received 2 units of blood transfusion at [facility] yesterday secondary to a hemoglobin of 7.3 according to the husband. She was at her baseline mental status however today she was significantly altered and unable to take lactulose at home. The symptoms are persistent, gradually worsening, not associated with vomiting seizures or diarrhea.  Patient is a 59 y.o. female presenting with altered mental status. The history is provided by the patient.  Altered Mental Status   Past Medical History  Diagnosis Date  . Anxiety   . Elevated liver enzymes 03-26-13  . Jaundice 03-26-13    Jaundice -presently skin and sclera  . DRESS syndrome   . Hepatic encephalopathy   . Cancer     Liver   Past Surgical History  Procedure Laterality Date  . Knee arthroscopy Bilateral     both knees  . Eus N/A 04/03/2013    Procedure: ESOPHAGEAL ENDOSCOPIC ULTRASOUND (EUS) RADIAL;  Surgeon: Arta Silence, MD;  Location: [facility];  Service: Endoscopy;  Laterality: N/A;  bx of liver lesion  . Liver resection    . Cholecystectomy     Family History  Problem Relation Age of Onset  . Hypertension Mother   . Hypertension Father    History  Substance Use Topics  . Smoking status: Never Smoker   . Smokeless tobacco: Not on file  . Alcohol Use: No   OB History    No data available     Review of Systems  Unable to perform ROS: Mental status change      Allergies  Rocephin;  Vancomycin; Zosyn; Amoxicillin; and Sulfa antibiotics  Home Medications   Prior to Admission medications   Medication Sig Start Date End Date Taking? Authorizing Provider  ciprofloxacin (CIPRO) 500 MG tablet Take 1 tablet (500 mg total) by mouth daily with breakfast. 02/12/14  Yes Robbie Lis, MD  furosemide (LASIX) 40 MG tablet Take 40 mg by mouth.   Yes Historical Provider, MD  lactulose (CHRONULAC) 10 GM/15ML solution Take 15 mLs (10 g total) by mouth 2 (two) times daily. Patient taking differently: Take 10 g by mouth 3 (three) times daily as needed for mild constipation (constipation).  02/12/14  Yes Robbie Lis, MD  metoCLOPramide (REGLAN) 5 MG tablet Take 5 mg by mouth 4 (four) times daily as needed for nausea (nausea).   Yes Historical Provider, MD  nadolol (CORGARD) 20 MG tablet Take 10 mg by mouth daily.   Yes Historical Provider, MD  pantoprazole (PROTONIX) 40 MG tablet Take 1 tablet (40 mg total) by mouth every 12 (twelve) hours. 02/12/14  Yes Robbie Lis, MD  rifaximin (XIFAXAN) 550 MG TABS tablet Take 1 tablet (550 mg total) by mouth 2 (two) times daily. 02/12/14  Yes Robbie Lis, MD  sertraline (ZOLOFT) 25 MG tablet Take 25 mg by mouth every morning.   Yes Historical Provider, MD  sucralfate (CARAFATE) 1 GM/10ML suspension Take 10 mLs (1 g total) by mouth 3 (three) times daily with meals. Patient taking differently: Take 1 g  by mouth 4 (four) times daily.  02/12/14  Yes Robbie Lis, MD  furosemide (LASIX) 20 MG tablet Take 1 tablet (20 mg total) by mouth daily. Patient not taking: Reported on 04/02/2014 02/12/14   Robbie Lis, MD  spironolactone (ALDACTONE) 50 MG tablet Take 1 tablet (50 mg total) by mouth daily. 02/12/14 02/12/15  Robbie Lis, MD   BP 130/73 mmHg  Pulse 77  Temp(Src) 98.1 F (36.7 C) (Oral)  Resp 14  SpO2 100% Physical Exam  Constitutional: She appears well-developed and well-nourished. She appears distressed.  HENT:  Head: Normocephalic and  atraumatic.  Mouth/Throat: Oropharynx is clear and moist. No oropharyngeal exudate.  Eyes: Conjunctivae and EOM are normal. Pupils are equal, round, and reactive to light. Right eye exhibits no discharge. Left eye exhibits no discharge. No scleral icterus.  Neck: Normal range of motion. Neck supple. No JVD present. No thyromegaly present.  Cardiovascular: Normal rate, regular rhythm, normal heart sounds and intact distal pulses.  Exam reveals no gallop and no friction rub.   No murmur heard. Pulmonary/Chest: Effort normal and breath sounds normal. No respiratory distress. She has no wheezes. She has no rales.  Abdominal: Bowel sounds are normal. She exhibits distension. She exhibits no mass. There is no tenderness. There is no rebound and no guarding.  Musculoskeletal: Normal range of motion. She exhibits edema (bilateral lower extremity 2+ pitting edema, symmetrical). She exhibits no tenderness.  Lymphadenopathy:    She has no cervical adenopathy.  Neurological: She is alert. Coordination normal.  Breathing spontaneously, grimaces to pain, does not follow commands, says occasional words  Skin: Skin is warm and dry. No rash noted. No erythema.  Jaundice  Psychiatric: She has a normal mood and affect. Her behavior is normal.  Nursing note and vitals reviewed.   ED Course  Procedures (including critical care time) Labs Review Labs Reviewed  CBC WITH DIFFERENTIAL/PLATELET - Abnormal; Notable for the following:    RBC 3.45 (*)    Hemoglobin 10.0 (*)    HCT 30.3 (*)    RDW 16.1 (*)    All other components within normal limits  COMPREHENSIVE METABOLIC PANEL - Abnormal; Notable for the following:    Glucose, Bld 102 (*)    BUN 28 (*)    Creatinine, Ser 1.21 (*)    Calcium 8.2 (*)    Total Protein 5.5 (*)    Albumin 2.0 (*)    AST 124 (*)    ALT 74 (*)    Alkaline Phosphatase 615 (*)    Total Bilirubin 2.3 (*)    GFR calc non Af Amer 47 (*)    GFR calc Af Amer 55 (*)    All other  components within normal limits  AMMONIA - Abnormal; Notable for the following:    Ammonia 134 (*)    All other components within normal limits    Imaging Review No results found.    MDM   Final diagnoses:  Hepatic encephalopathy    The patient appears to have acute encephalopathy, this is most likely to be hepatic given her history. She will need labs, rectal lactulose will likely be needed as she is not tolerating oral, anticipate admission to the hospital. She does not have abdominal tenderness despite having a tight distended fluid wave abdomen, she does not appear to have spontaneous bacterial peritonitis on exam, she has not recently had a paracentesis though she has had 2 admissions to the hospital over the last 2 months for  similar altered mental status.  The patient has acute hepatic encephalopathy, elevated ammonia level consistent, discussed with the hospitalist who will admit. The patient is currently getting lactulose enemas as she is unable to take oral lactulose.  Johnna Acosta, MD 04/02/14 1340

## 2014-04-02 NOTE — Plan of Care (Signed)
Problem: Consults Goal: General Medical Patient Education See Patient Education Module for specific education. Outcome: Progressing Patient education explained to husband with verbal understanding.

## 2014-04-02 NOTE — H&P (Signed)
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 female with past medical history of cholangiocarcinoma and previous history of hepatic encephalopathy who was brought in by her husband as that she wasn't responding appropriately this morning. Patient had an appointment at [facility] yesterday for regular follow-up and for her blood transfusions. She also has a history of ascites, which has been tapped in the past. According to the husband, patient appeared to be a little bit more lethargic than normal last night. This morning he couldn't wake her up. She was very confused and mumbling and moaning. So he decided to bring her to the hospital as he suspected that her ammonia level was elevated. Patient is arousable but doesn't answer questions. So no history is available from her.  Home Medications: Prior to Admission medications   Medication Sig Start Date End Date Taking? Authorizing Provider  ciprofloxacin (CIPRO) 500 MG tablet Take 1 tablet (500 mg total) by mouth daily with breakfast. 02/12/14  Yes Robbie Lis, MD  furosemide (LASIX) 40 MG tablet Take 40 mg by mouth.   Yes Historical Provider, MD  lactulose (CHRONULAC) 10 GM/15ML solution Take 15 mLs (10 g total) by mouth 2 (two) times daily. Patient taking differently: Take 10 g by mouth 3 (three) times daily as needed for mild constipation (constipation).  02/12/14  Yes Robbie Lis, MD  metoCLOPramide (REGLAN) 5 MG tablet Take 5 mg by mouth 4 (four) times daily as needed for nausea (nausea).   Yes Historical Provider, MD  nadolol (CORGARD) 20 MG tablet Take 10 mg by mouth daily.   Yes Historical Provider, MD  pantoprazole (PROTONIX) 40 MG tablet Take 1 tablet (40 mg total) by mouth every 12 (twelve) hours.  02/12/14  Yes Robbie Lis, MD  rifaximin (XIFAXAN) 550 MG TABS tablet Take 1 tablet (550 mg total) by mouth 2 (two) times daily. 02/12/14  Yes Robbie Lis, MD  sertraline (ZOLOFT) 25 MG tablet Take 25 mg by mouth every morning.   Yes Historical Provider, MD  sucralfate (CARAFATE) 1 GM/10ML suspension Take 10 mLs (1 g total) by mouth 3 (three) times daily with meals. Patient taking differently: Take 1 g by mouth 4 (four) times daily.  02/12/14  Yes Robbie Lis, MD  furosemide (LASIX) 20 MG tablet Take 1 tablet (20 mg total) by mouth daily. Patient not taking: Reported on 04/02/2014 02/12/14   Robbie Lis, MD  spironolactone (ALDACTONE) 50 MG tablet Take 1 tablet (50 mg total) by mouth daily. 02/12/14 02/12/15  Robbie Lis, MD    Allergies:  Allergies  Allergen Reactions  . Rocephin [Ceftriaxone] Other (See Comments)    Dress syndrome  . Vancomycin Other (See Comments)    Dress syndrome.   Marland Kitchen Zosyn [Piperacillin Sod-Tazobactam So]     Dress syndrome  . Amoxicillin Rash  . Sulfa Antibiotics Rash    Past Medical History: Past Medical History  Diagnosis Date  . Anxiety   . Elevated liver enzymes 03-26-13  . Jaundice 03-26-13    Jaundice -presently skin and sclera  . DRESS syndrome   . Hepatic encephalopathy   . Cancer     Liver    Past Surgical History  Procedure Laterality Date  . Knee arthroscopy Bilateral     both knees  .  Eus N/A 04/03/2013    Procedure: ESOPHAGEAL ENDOSCOPIC ULTRASOUND (EUS) RADIAL;  Surgeon: Arta Silence, MD;  Location: [facility];  Service: Endoscopy;  Laterality: N/A;  bx of liver lesion  . Liver resection    . Cholecystectomy      Social History: Lives with her husband in [place]. No history of smoking, alcohol use or illicit drug use.  Family History:  Family History  Problem Relation Age of Onset  . Hypertension Mother   . Hypertension Father      Review of Systems - unable to obtain due to her mental status  Physical  Examination  Filed Vitals:   04/02/14 1157 04/02/14 1307 04/02/14 1345 04/02/14 1351  BP: 131/76 130/73 119/77 119/77  Pulse: 81 77 79 79  Temp:      TempSrc:      Resp: _0 SpO2: 100% 100% 100% 100%    BP 119/77 mmHg  Pulse 79  Temp(Src) 98.1 F (36.7 C) (Oral)  Resp 13  SpO2 100%  General appearance: Drowsy but arousable. Unable to converse. Mumbles and moans. Head: Normocephalic, without obvious abnormality, atraumatic Eyes: conjunctivae/corneas clear. PERRL, Throat: Dry mucous membranes Neck: no adenopathy, no carotid bruit, no JVD, supple, symmetrical, trachea midline and thyroid not enlarged, symmetric, no tenderness/mass/nodules Resp: clear to auscultation bilaterally Cardio: regular rate and rhythm, S1, S2 normal, no murmur, click, rub or gallop GI: soft, non-tender; bowel sounds normal; no masses,  no organomegaly Extremities: extremities normal, atraumatic, no cyanosis or edema Pulses: 2+ and symmetric Skin: Skin color, texture, turgor normal. No rashes or lesions Lymph nodes: Cervical, supraclavicular, and axillary nodes normal. Neurologic: Arousable. Opens her eyes. Moving all her extremities. No facial droop.  Laboratory Data: Results for orders placed or performed during the hospital encounter of 04/02/14 (from the past 48 hour(s))  CBC with Differential/Platelet     Status: Abnormal   Collection Time: 04/02/14 12:12 PM  Result Value Ref Range   WBC 5.3 4.0 - 10.5 K/uL   RBC 3.45 (L) 3.87 - 5.11 MIL/uL   Hemoglobin 10.0 (L) 12.0 - 15.0 g/dL   HCT 30.3 (L) 36.0 - 46.0 %   MCV 87.8 78.0 - 100.0 fL   MCH 29.0 26.0 - 34.0 pg   MCHC 33.0 30.0 - 36.0 g/dL   RDW 16.1 (H) 11.5 - 15.5 %   Platelets 192 150 - 400 K/uL   Neutrophils Relative % 67 43 - 77 %   Neutro Abs 3.5 1.7 - 7.7 K/uL   Lymphocytes Relative 18 12 - 46 %   Lymphs Abs 1.0 0.7 - 4.0 K/uL   Monocytes Relative 12 3 - 12 %   Monocytes Absolute 0.6 0.1 - 1.0 K/uL   Eosinophils Relative 2 0  - 5 %   Eosinophils Absolute 0.1 0.0 - 0.7 K/uL   Basophils Relative 1 0 - 1 %   Basophils Absolute 0.1 0.0 - 0.1 K/uL  Comprehensive metabolic panel     Status: Abnormal   Collection Time: 04/02/14 12:12 PM  Result Value Ref Range   Sodium 136 135 - 145 mmol/L   Potassium 4.1 3.5 - 5.1 mmol/L   Chloride 108 96 - 112 mmol/L   CO2 21 19 - 32 mmol/L   Glucose, Bld 102 (H) 70 - 99 mg/dL   BUN 28 (H) 6 - 23 mg/dL   Creatinine, Ser 1.21 (H) 0.50 - 1.10 mg/dL   Calcium 8.2 (L) 8.4 - 10.5 mg/dL   Total Protein  5.5 (L) 6.0 - 8.3 g/dL   Albumin 2.0 (L) 3.5 - 5.2 g/dL   AST 124 (H) 0 - 37 U/L   ALT 74 (H) 0 - 35 U/L   Alkaline Phosphatase 615 (H) 39 - 117 U/L   Total Bilirubin 2.3 (H) 0.3 - 1.2 mg/dL   GFR calc non Af Amer 47 (L) >90 mL/min   GFR calc Af Amer 55 (L) >90 mL/min    Comment: (NOTE) The eGFR has been calculated using the CKD EPI equation. This calculation has not been validated in all clinical situations. eGFR's persistently <90 mL/min signify possible Chronic Kidney Disease.    Anion gap 7 5 - 15  Ammonia     Status: Abnormal   Collection Time: 04/02/14 12:12 PM  Result Value Ref Range   Ammonia 134 (H) 11 - 32 umol/L    Radiology Reports: No results found.   Problem List  Principal Problem:   Hepatic encephalopathy Active Problems:   Cholangiocarcinoma   Anemia due to chronic blood loss   Ascites   Transaminitis   Assessment: This is a 59 year old Caucasian female who has a known history of cholangiocarcinoma status post surgery at [facility]. She is currently not not getting any treatment. She was admitted recently for hepatic encephalopathy. Now presents with altered mental status and has elevated ammonia level consistent with hepatic encephalopathy  Plan: #1. Acute hepatic encephalopathy: Since she is unable to take lactulose orally it'll be given as an enema. Ammonia level will be repeated in the morning. She'll be kept nothing by mouth.  #2.  Abnormal LFTs: These are chronic for her, though bilirubin is slightly higher than her baseline. She has a known history of cholangiocarcinoma. Continue to monitor  #3. Ascites: This is a sequelae of her surgery and cancer. According to the husband, abdomen doesn't look much more distended than normal. We will continue to monitor. Her abdomen is benign. There is no need for urgent paracentesis.  #4 anemia due to chronic disease: Hemoglobin stable  #5. Mild acute renal failure: She'll be given gentle IV hydration. Monitor urine output.   DVT Prophylaxis: SCDs Code Status: Full code Family Communication: Discussed with her husband  Disposition Plan: Admit to MedSurg   Further management decisions will depend on results of further testing and patient's response to treatment.   [facility]  [facility] Pager [phone]  If 7PM-7AM, please contact night-coverage [email or web address] Password [email or web address]  04/02/2014, 2:27 PM

## 2014-04-02 NOTE — ED Notes (Signed)
Pt's husband states that pt has hx of hepatic encephalopathy.  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  Has half a liver and no gallbladder.  Received 2 units of blood yesterday from [facility] because of GI bleeding of unknown etiology.  Has been getting lactulose at home to take care of the excess ammonia.  Pt has been "in and out of consciousness today".  Pt will not answer LOC questions.

## 2014-04-02 NOTE — ED Notes (Signed)
Lactulose given rectally per MD. Patient had large amount of dark brown/dark red stool. Patient seems to be slightly more alert but unable to answer questions appropriately still.

## 2014-04-03 DIAGNOSIS — R110 Nausea: Secondary | ICD-10-CM

## 2014-04-03 DIAGNOSIS — C221 Intrahepatic bile duct carcinoma: Secondary | ICD-10-CM

## 2014-04-03 DIAGNOSIS — K920 Hematemesis: Secondary | ICD-10-CM

## 2014-04-03 LAB — COMPREHENSIVE METABOLIC PANEL
ALT: 68 U/L — ABNORMAL HIGH (ref 0–35)
ANION GAP: 7 (ref 5–15)
AST: 107 U/L — ABNORMAL HIGH (ref 0–37)
Albumin: 1.8 g/dL — ABNORMAL LOW (ref 3.5–5.2)
Alkaline Phosphatase: 568 U/L — ABNORMAL HIGH (ref 39–117)
BILIRUBIN TOTAL: 2 mg/dL — AB (ref 0.3–1.2)
BUN: 29 mg/dL — AB (ref 6–23)
CHLORIDE: 114 mmol/L — AB (ref 96–112)
CO2: 20 mmol/L (ref 19–32)
CREATININE: 1.07 mg/dL (ref 0.50–1.10)
Calcium: 8.1 mg/dL — ABNORMAL LOW (ref 8.4–10.5)
GFR calc Af Amer: 64 mL/min — ABNORMAL LOW (ref >90)
GFR, EST NON AFRICAN AMERICAN: 55 mL/min — AB (ref >90)
Glucose, Bld: 105 mg/dL — ABNORMAL HIGH (ref 70–99)
Potassium: 3.4 mmol/L — ABNORMAL LOW (ref 3.5–5.1)
Sodium: 141 mmol/L (ref 135–145)
Total Protein: 5 g/dL — ABNORMAL LOW (ref 6.0–8.3)

## 2014-04-03 LAB — CBC
HCT: 27.5 % — ABNORMAL LOW (ref 36.0–46.0)
HCT: 25.2 % — ABNORMAL LOW (ref 36.0–46.0)
HEMATOCRIT: 24 % — AB (ref 36.0–46.0)
HEMOGLOBIN: 7.9 g/dL — AB (ref 12.0–15.0)
Hemoglobin: 9.1 g/dL — ABNORMAL LOW (ref 12.0–15.0)
Hemoglobin: 8.4 g/dL — ABNORMAL LOW (ref 12.0–15.0)
MCH: 29.4 pg (ref 26.0–34.0)
MCH: 29.1 pg (ref 26.0–34.0)
MCH: 29.5 pg (ref 26.0–34.0)
MCHC: 33.1 g/dL (ref 30.0–36.0)
MCHC: 33.3 g/dL (ref 30.0–36.0)
MCHC: 32.9 g/dL (ref 30.0–36.0)
MCV: 89 fL (ref 78.0–100.0)
MCV: 87.2 fL (ref 78.0–100.0)
MCV: 89.6 fL (ref 78.0–100.0)
Platelets: 272 10*3/uL (ref 150–400)
Platelets: UNDETERMINED 10*3/uL (ref 150–400)
Platelets: 225 10*3/uL (ref 150–400)
RBC: 3.09 MIL/uL — ABNORMAL LOW (ref 3.87–5.11)
RBC: 2.89 MIL/uL — ABNORMAL LOW (ref 3.87–5.11)
RBC: 2.68 MIL/uL — ABNORMAL LOW (ref 3.87–5.11)
RDW: 16.4 % — ABNORMAL HIGH (ref 11.5–15.5)
RDW: 16.3 % — AB (ref 11.5–15.5)
RDW: 16.6 % — AB (ref 11.5–15.5)
WBC: 6.3 10*3/uL (ref 4.0–10.5)
WBC: 5.6 10*3/uL (ref 4.0–10.5)
WBC: 5.6 10*3/uL (ref 4.0–10.5)

## 2014-04-03 LAB — AMMONIA: Ammonia: 75 umol/L — ABNORMAL HIGH (ref 11–32)

## 2014-04-03 LAB — PROTIME-INR
INR: 1.15 (ref 0.00–1.49)
Prothrombin Time: 14.8 seconds (ref 11.6–15.2)

## 2014-04-03 LAB — PREPARE RBC (CROSSMATCH)

## 2014-04-03 MED ORDER — NADOLOL 20 MG PO TABS
10.0000 mg | ORAL_TABLET | Freq: Every day | ORAL | Status: DC
  Administered 2014-04-03 – 2014-04-04 (×2): 10 mg via ORAL
  Filled 2014-04-03 (×2): qty 1

## 2014-04-03 MED ORDER — POTASSIUM CHLORIDE 10 MEQ/100ML IV SOLN
10.0000 meq | INTRAVENOUS | Status: AC
  Administered 2014-04-03 (×4): 10 meq via INTRAVENOUS
  Filled 2014-04-03 (×4): qty 100

## 2014-04-03 MED ORDER — SODIUM CHLORIDE 0.9 % IV SOLN: Freq: Once | INTRAVENOUS | Status: DC

## 2014-04-03 MED ORDER — PANTOPRAZOLE SODIUM 40 MG IV SOLR
40.0000 mg | Freq: Two times a day (BID) | INTRAVENOUS | Status: DC
  Administered 2014-04-03 – 2014-04-04 (×3): 40 mg via INTRAVENOUS
  Filled 2014-04-03 (×4): qty 40

## 2014-04-03 MED ORDER — PANTOPRAZOLE SODIUM 40 MG IV SOLR
40.0000 mg | Freq: Once | INTRAVENOUS | Status: AC
  Administered 2014-04-03: 40 mg via INTRAVENOUS
  Filled 2014-04-03: qty 40

## 2014-04-03 MED ORDER — LACTULOSE 10 GM/15ML PO SOLN
30.0000 g | Freq: Three times a day (TID) | ORAL | Status: DC
  Administered 2014-04-03 – 2014-04-04 (×4): 30 g via ORAL
  Filled 2014-04-03 (×6): qty 45

## 2014-04-03 MED ORDER — RIFAXIMIN 550 MG PO TABS
550.0000 mg | ORAL_TABLET | Freq: Two times a day (BID) | ORAL | Status: DC
  Administered 2014-04-03 – 2014-04-04 (×3): 550 mg via ORAL
  Filled 2014-04-03 (×5): qty 1

## 2014-04-03 MED ORDER — SODIUM CHLORIDE 0.9 % IV SOLN
INTRAVENOUS | Status: DC
  Administered 2014-04-03: 15:00:00 via INTRAVENOUS

## 2014-04-03 MED ORDER — SUCRALFATE 1 GM/10ML PO SUSP
1.0000 g | Freq: Three times a day (TID) | ORAL | Status: DC
  Administered 2014-04-03 – 2014-04-04 (×2): 1 g via ORAL
  Filled 2014-04-03 (×6): qty 10

## 2014-04-03 MED ORDER — SERTRALINE HCL 25 MG PO TABS
25.0000 mg | ORAL_TABLET | Freq: Every morning | ORAL | Status: DC
  Administered 2014-04-03 – 2014-04-04 (×2): 25 mg via ORAL
  Filled 2014-04-03 (×2): qty 1

## 2014-04-03 NOTE — Progress Notes (Signed)
[facility] PROGRESS NOTE  Joann Castaneda WHQ:[id number] DOB: Apr 27, 1955 DOA: 04/02/2014  PCP: Cari Caraway, MD  Brief HPI: 59 year old Caucasian female with a past medical history of cholangiocarcinoma status post surgery at [facility] who presented with confusion. She was found to have acute hepatic encephalopathy and was admitted to the hospital.  Past medical history:  Past Medical History  Diagnosis Date  . Anxiety   . Elevated liver enzymes 03-26-13  . Jaundice 03-26-13    Jaundice -presently skin and sclera  . DRESS syndrome   . Hepatic encephalopathy   . Cancer     Liver    Consultants: None  Procedures: None  Antibiotics: None  Subjective: Patient awake and alert today. She has chronic abdominal pain, which is at her baseline. She had one episode of bloody emesis at about 1 AM this morning. Was feeling nauseous at that time. But no further nausea currently. Denies any further blood in the stool either. Husband is at the bedside  Objective: Vital Signs  Filed Vitals:   04/02/14 1501 04/02/14 2238 04/03/14 0211 04/03/14 0426  BP: 126/68 106/58 134/85 131/67  Pulse: 74 76 97 99  Temp: 97.5 F (36.4 C) 98.1 F (36.7 C)  97.5 F (36.4 C)  TempSrc: Axillary Oral  Oral  Resp: 16 16 16 18   Height: 5\' 6"  (1.676 m)     Weight: 61.3 kg (135 lb 2.3 oz)     SpO2: 100% 100% 100% 100%    Intake/Output Summary (Last 24 hours) at 04/03/14 0912 Last data filed at 04/03/14 0100  Gross per 24 hour  Intake      0 ml  Output    100 ml  Net   -100 ml   Filed Weights   04/02/14 1501  Weight: 61.3 kg (135 lb 2.3 oz)    General appearance: alert, cooperative, appears stated age and no distress Resp: clear to auscultation bilaterally Cardio: regular rate and rhythm, S1, S2 normal, no murmur, click, rub or gallop GI: Abdomen is distended, ascites is present. Not very tender to palpation. Bowel sounds are present. No masses or organomegaly. Extremities:  extremities normal, atraumatic, no cyanosis or edema Awake and alert today. No focal neurological deficits noted. Oriented 3.  Lab Results:  Basic Metabolic Panel:  Recent Labs Lab 04/02/14 1212 04/03/14 0241  NA 136 141  K 4.1 3.4*  CL 108 114*  CO2 21 20  GLUCOSE 102* 105*  BUN 28* 29*  CREATININE 1.21* 1.07  CALCIUM 8.2* 8.1*   Liver Function Tests:  Recent Labs Lab 04/02/14 1212 04/03/14 0241  AST 124* 107*  ALT 74* 68*  ALKPHOS 615* 568*  BILITOT 2.3* 2.0*  PROT 5.5* 5.0*  ALBUMIN 2.0* 1.8*    Recent Labs Lab 04/02/14 1212 04/03/14 0242  AMMONIA 134* 75*   CBC:  Recent Labs Lab 04/02/14 1212 04/03/14 0158  WBC 5.3 6.3  NEUTROABS 3.5  --   HGB 10.0* 9.1*  HCT 30.3* 27.5*  MCV 87.8 89.0  PLT 192 272     Studies/Results: No results found.  Medications:  Scheduled: . lactulose  30 g Oral TID  . nadolol  10 mg Oral Daily  . pantoprazole (PROTONIX) IV  40 mg Intravenous Q12H  . potassium chloride  10 mEq Intravenous Q1 Hr x 4  . rifaximin  550 mg Oral BID  . sertraline  25 mg Oral q morning - 10a  . sodium chloride  3 mL Intravenous Q12H  . sucralfate  1 g Oral TID WC   Continuous: . sodium chloride     GXQ:[id number] **OR** ondansetron (ZOFRAN) IV  Assessment/Plan:  Principal Problem:   Hepatic encephalopathy Active Problems:   Cholangiocarcinoma   Anemia due to chronic blood loss   Ascites   Transaminitis    Acute hepatic encephalopathy She has significantly improved in terms of her metal status. According to the husband, she is pretty close to her baseline now. Ammonia level is improved. We will change her lactulose to oral. Resume other medications well.  GI bleed with hematemesis. She also had some hematochezia as well. She's had this before as well. She was seen by GI when she was hospitalized here back in December. She underwent EGD at [facility] on January 7. The report was pulled from careEverywhere.    "EGD  02/27/2014 at [facility]: Non bleeding grade I esophageal varices. 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 A grade 1 esophageal varices was noted, but it was nonbleeding at that time. I think this bleeding is predominantly due to her gastritis and duodenitis. We will continue with PPI. We'll place her back on Carafate. If she doesn't have any further bleeding and if her hemoglobin doesn't drop significantly I don't think she requires further workup in the hospital. She can follow-up with her providers at [facility]. Replace her potassium.  Abnormal LFTs These are chronic for her. Counts are stable. She has a known history of cholangiocarcinoma. Continue to monitor  Ascites This is a sequelae of her surgery and cancer. Abdomen is not much different from recent past. No need for urgent paracentesis. No suspicion for SBP.   Anemia due to chronic disease Continue to monitor hemoglobin, especially in the setting of hematemesis.  Mild acute renal failure Renal function is improved with IV hydration. Monitor urine output.  DVT Prophylaxis: SCDs Code Status: Full code Family Communication: Discussed with the patient and husband  Disposition Plan: Start mobilizing.     LOS: 1 day   [facility] Pager [phone] 04/03/2014, 9:12 AM  If 7PM-7AM, please contact night-coverage at [email or web address], password [email or web address]

## 2014-04-03 NOTE — Progress Notes (Signed)
Pt seems more alert and coherent as the shift goes on, although still confused. Able to use call bell for bathroom assistance at times. Paged provider to see if lactulose enemas could be changed to PO lactulose given pts ability to follow commands and improved LOC, order received. Pt took 38mL lactulose at midnight without difficulty. At 0050, pt abruptly sat up and vomited 100cc of bright red blood emesis into bag. She had no other complaints at that time and was assisted to [facility] for BM. Paged provider again regarding pt vomiting blood, awaiting response. Continue to monitor. Hortencia Conradi RN

## 2014-04-03 NOTE — Progress Notes (Signed)
Pt vomited another 300cc of blood into emesis bag and also noted 2 quarter size blood clots and 1 3" long blood clot on her blanket. Once pt stood up, I was able to see that the pad in her bed was about 25% covered in blood stains. Pt has no complaints of pain. VS 134/85, HR 97, O2 sat 97% on room air. Provider paged again per request if additional vomiting of blood, awaiting response. Hortencia Conradi RN

## 2014-04-04 LAB — TYPE AND SCREEN
ABO/RH(D): A POS
Antibody Screen: POSITIVE
DAT, IGG: NEGATIVE
Donor AG Type: NEGATIVE
UNIT DIVISION: 0

## 2014-04-04 LAB — COMPREHENSIVE METABOLIC PANEL
ALBUMIN: 1.5 g/dL — AB (ref 3.5–5.2)
ALT: 73 U/L — ABNORMAL HIGH (ref 0–35)
ANION GAP: 5 (ref 5–15)
AST: 130 U/L — ABNORMAL HIGH (ref 0–37)
Alkaline Phosphatase: 524 U/L — ABNORMAL HIGH (ref 39–117)
BILIRUBIN TOTAL: 2.4 mg/dL — AB (ref 0.3–1.2)
BUN: 23 mg/dL (ref 6–23)
CALCIUM: 7.5 mg/dL — AB (ref 8.4–10.5)
CHLORIDE: 111 mmol/L (ref 96–112)
CO2: 19 mmol/L (ref 19–32)
CREATININE: 0.88 mg/dL (ref 0.50–1.10)
GFR calc Af Amer: 81 mL/min — ABNORMAL LOW (ref >90)
GFR calc non Af Amer: 69 mL/min — ABNORMAL LOW (ref >90)
Glucose, Bld: 88 mg/dL (ref 70–99)
Potassium: 3.2 mmol/L — ABNORMAL LOW (ref 3.5–5.1)
Sodium: 135 mmol/L (ref 135–145)
TOTAL PROTEIN: 4.2 g/dL — AB (ref 6.0–8.3)

## 2014-04-04 LAB — CBC
HCT: 25.2 % — ABNORMAL LOW (ref 36.0–46.0)
Hemoglobin: 8.3 g/dL — ABNORMAL LOW (ref 12.0–15.0)
MCH: 29.1 pg (ref 26.0–34.0)
MCHC: 32.9 g/dL (ref 30.0–36.0)
MCV: 88.4 fL (ref 78.0–100.0)
Platelets: 179 10*3/uL (ref 150–400)
RBC: 2.85 MIL/uL — AB (ref 3.87–5.11)
RDW: 16.2 % — ABNORMAL HIGH (ref 11.5–15.5)
WBC: 4.2 10*3/uL (ref 4.0–10.5)

## 2014-04-04 LAB — AMMONIA: AMMONIA: 84 umol/L — AB (ref 11–32)

## 2014-04-04 MED ORDER — POTASSIUM CHLORIDE CRYS ER 20 MEQ PO TBCR
40.0000 meq | EXTENDED_RELEASE_TABLET | Freq: Once | ORAL | Status: AC
  Administered 2014-04-04: 40 meq via ORAL
  Filled 2014-04-04: qty 2

## 2014-04-04 MED ORDER — LACTULOSE 10 GM/15ML PO SOLN: 30.0000 g | Freq: Two times a day (BID) | ORAL | Status: AC

## 2014-04-04 NOTE — Discharge Summary (Signed)
[facility]  Physician Discharge Summary   Patient ID: Joann Castaneda MRN: [id number] DOB/AGE: 25-Jul-1955 59 y.o.  Admit date: 04/02/2014 Discharge date: 04/04/2014  PCP: Cari Caraway, MD  DISCHARGE DIAGNOSES:  Principal Problem:   Hepatic encephalopathy Active Problems:   Cholangiocarcinoma   Anemia due to chronic blood loss   Ascites   Transaminitis   RECOMMENDATIONS FOR OUTPATIENT FOLLOW UP: 1. Patient encouraged to keep her appointment at [facility] on Monday.  DISCHARGE CONDITION: fair  Diet recommendation: As before  Filed Weights   04/02/14 1501  Weight: 61.3 kg (135 lb 2.3 oz)    INITIAL HISTORY: 59 year old Caucasian female with a past medical history of cholangiocarcinoma status post surgery at [facility] who presented with confusion. She was found to have acute hepatic encephalopathy and was admitted to the hospital.  Consultations:  None  Procedures:  Blood transfusion of 1 unit  HOSPITAL COURSE:   Acute hepatic encephalopathy She was noted to have extremely elevated ammonia level. She had to be given lactulose through enema initially. Ammonia level came down. Her mental status improved. She was switched over to oral lactulose. Ammonia level is 84 this morning. Though it is high clinically she is back to baseline. She has close follow-up with her providers in [facility] on Monday.   GI bleed with hematemesis. Asian had 2 episodes of hematemesis. This has been a chronic issue for her. Ever since her radiation treatment for her cholangiocarcinoma. She has developed a radiation gastritis and duodenitis. Hemoglobin did drop to 7.9. She was transfused 1 unit of blood. She didn't have any further episodes of hematemesis. She also had some hematochezia as well. She's had this before as well. She was seen by GI when she was hospitalized here back in December. She underwent EGD at [facility] on January 7. The report was pulled from  careEverywhere.   "EGD 02/27/2014 at [facility]: Non bleeding grade I esophageal varices. 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 A grade 1 esophageal varices was noted, but it was nonbleeding at that time. I think this bleeding is predominantly due to her gastritis and duodenitis. She was continued on PPI. She was placed back on her Carafate. Since bleeding has stopped and she underwent EGD just quite recently and this appears to be a chronic issue for her, no further intervention was thought to be necessary. She has a close follow-up with her providers on Monday. She's been told that if she has recurrence of her bleeding, she should seek attention immediately.   Abnormal LFTs These are chronic for her. Counts are stable. She has a known history of cholangiocarcinoma. Continue to monitor  Ascites This is a sequelae of her surgery and cancer. Abdomen is not much different from recent past. No need for urgent paracentesis. No suspicion for SBP.   Anemia due to chronic disease Hemoglobin did drop some yesterday. She was transfused 1 unit. Discussed earlier. 8.3 this morning.  Mild acute renal failure Renal function is improved with IV hydration.   She ambulated without any difficulty. All of the above was discussed with the patient and her husband. She was reporting some loose stools, which is most likely due to the lactulose. No blood in the stool was noted,  per RN. Dose of lactulose will be reduced. Patient instructed on further dose reduction if loose stool persists greater than 6 per day. She and her husband voiced understanding. She was subsequently discharged.  Potassium  was repleted.  PERTINENT LABS:  The results of significant diagnostics from this hospitalization (including imaging, microbiology, ancillary and laboratory) are listed below for reference.     Labs: Basic Metabolic Panel:  Recent Labs Lab 04/02/14 1212 04/03/14 0241 04/04/14 0511  NA 136 141 135  K 4.1 3.4* 3.2*  CL 108 114* 111  CO2 21 20 19   GLUCOSE 102* 105* 88  BUN 28* 29* 23  CREATININE 1.21* 1.07 0.88  CALCIUM 8.2* 8.1* 7.5*   Liver Function Tests:  Recent Labs Lab 04/02/14 1212 04/03/14 0241 04/04/14 0511  AST 124* 107* 130*  ALT 74* 68* 73*  ALKPHOS 615* 568* 524*  BILITOT 2.3* 2.0* 2.4*  PROT 5.5* 5.0* 4.2*  ALBUMIN 2.0* 1.8* 1.5*    Recent Labs Lab 04/02/14 1212 04/03/14 0242 04/04/14 0511  AMMONIA 134* 75* 84*   CBC:  Recent Labs Lab 04/02/14 1212 04/03/14 0158 04/03/14 0853 04/03/14 1445 04/04/14 0511  WBC 5.3 6.3 5.6 5.6 4.2  NEUTROABS 3.5  --   --   --   --   HGB 10.0* 9.1* 8.4* 7.9* 8.3*  HCT 30.3* 27.5* 25.2* 24.0* 25.2*  MCV 87.8 89.0 87.2 89.6 88.4  PLT 192 272 PLATELET CLUMPS NOTED ON SMEAR, UNABLE TO ESTIMATE 225 179    DISCHARGE EXAMINATION: Filed Vitals:   04/03/14 2223 04/03/14 2256 04/04/14 0056 04/04/14 0700  BP: 108/58 99/51 113/72 105/65  Pulse: 81 80 75 77  Temp: 98.5 F (36.9 C) 97.5 F (36.4 C) 98.1 F (36.7 C) 97.5 F (36.4 C)  TempSrc: Oral Oral Oral Oral  Resp: 16 16 16 16   Height:      Weight:      SpO2: 100% 100% 100% 100%   General appearance: alert, cooperative, appears stated age and no distress Resp: clear to auscultation bilaterally Cardio: regular rate and rhythm, S1, S2 normal, no murmur, click, rub or gallop GI: soft, non-tender; bowel sounds normal; no masses,  no organomegaly  DISPOSITION: Home with husband  Discharge Instructions    Call MD for:  extreme fatigue    Complete by:  As directed      Call MD for:  persistant dizziness or light-headedness    Complete by:  As directed        Call MD for:  persistant nausea and vomiting    Complete by:  As directed      Call MD for:  severe uncontrolled pain    Complete by:  As directed      Call MD for:  temperature >100.4    Complete by:  As directed      Discharge instructions    Complete by:  As directed   If you continue to have more than 6 bowel movements in a 24 hour period please cut down your Lactulose dose to 65ml twice a day. Please keep your appointment at [facility] on Monday. Please seek attention if you start bleeding again.     Increase activity slowly    Complete by:  As directed            ALLERGIES:  Allergies  Allergen Reactions  . Rocephin [Ceftriaxone] Other (See Comments)    Dress syndrome  . Vancomycin Other (See Comments)    Dress syndrome.   Marland Kitchen Zosyn [  Piperacillin Sod-Tazobactam So]     Dress syndrome  . Amoxicillin Rash  . Sulfa Antibiotics Rash     Discharge Medication List as of 04/04/2014 11:06 AM    CONTINUE these medications which have CHANGED   Details  lactulose (CHRONULAC) 10 GM/15ML solution Take 45 mLs (30 g total) by mouth 2 (two) times daily., Starting 04/04/2014, Until Discontinued, Print      CONTINUE these medications which have NOT CHANGED   Details  ciprofloxacin (CIPRO) 500 MG tablet Take 1 tablet (500 mg total) by mouth daily with breakfast., Starting 02/12/2014, Until Discontinued, Print    furosemide (LASIX) 40 MG tablet Take 40 mg by mouth., Until Discontinued, Historical Med    metoCLOPramide (REGLAN) 5 MG tablet Take 5 mg by mouth 4 (four) times daily as needed for nausea (nausea)., Until Discontinued, Historical Med    nadolol (CORGARD) 20 MG tablet Take 10 mg by mouth daily., Until Discontinued, Historical Med    pantoprazole (PROTONIX) 40 MG tablet Take 1 tablet (40 mg total) by mouth every 12 (twelve) hours., Starting 02/12/2014, Until Discontinued, Print    rifaximin (XIFAXAN) 550 MG TABS tablet Take 1 tablet (550 mg total) by mouth 2 (two) times daily.,  Starting 02/12/2014, Until Discontinued, Print    sertraline (ZOLOFT) 25 MG tablet Take 25 mg by mouth every morning., Until Discontinued, Historical Med    sucralfate (CARAFATE) 1 GM/10ML suspension Take 10 mLs (1 g total) by mouth 3 (three) times daily with meals., Starting 02/12/2014, Until Discontinued, Print    spironolactone (ALDACTONE) 50 MG tablet Take 1 tablet (50 mg total) by mouth daily., Starting 02/12/2014, Until Thu 02/12/15, Print         TOTAL DISCHARGE TIME: 35 mins  [facility] Pager [phone]  04/04/2014, 12:36 PM

## 2014-04-04 NOTE — Progress Notes (Signed)
Nursing Discharge Summary  Patient ID: AMALI UHLS MRN: [id number] DOB/AGE: 06/29/1955 59 y.o.  Admit date: 04/02/2014 Discharge date: 04/04/2014  Discharged Condition: good  Disposition: 01-Home or Self Care    Prescriptions Given: Prescription given for lactulose. Patient follow up appointments and medications discussed. Patient and husband Wille Glaser verbalized understanding without further questions.  Means of Discharge: Patient to be taken downstairs via wheelchair to be discharged home.   Signed: Buel Ream 04/04/2014, 1:56 PM

## 2014-06-07 ENCOUNTER — Emergency Department (HOSPITAL_COMMUNITY): Payer: BLUE CROSS/BLUE SHIELD

## 2014-06-07 ENCOUNTER — Encounter (HOSPITAL_COMMUNITY): Payer: Self-pay | Admitting: Emergency Medicine

## 2014-06-07 ENCOUNTER — Emergency Department (HOSPITAL_COMMUNITY)
Admission: EM | Admit: 2014-06-07 | Discharge: 2014-06-07 | Disposition: A | Discharge status: Home / Self Care | Payer: BLUE CROSS/BLUE SHIELD | Attending: Emergency Medicine | Admitting: Emergency Medicine

## 2014-06-07 DIAGNOSIS — Z8505 Personal history of malignant neoplasm of liver: Secondary | ICD-10-CM | POA: Diagnosis not present

## 2014-06-07 DIAGNOSIS — R2240 Localized swelling, mass and lump, unspecified lower limb: Secondary | ICD-10-CM | POA: Diagnosis not present

## 2014-06-07 DIAGNOSIS — Z880 Allergy status to penicillin: Secondary | ICD-10-CM | POA: Insufficient documentation

## 2014-06-07 DIAGNOSIS — Z79899 Other long term (current) drug therapy: Secondary | ICD-10-CM | POA: Insufficient documentation

## 2014-06-07 DIAGNOSIS — R0602 Shortness of breath: Secondary | ICD-10-CM | POA: Diagnosis not present

## 2014-06-07 DIAGNOSIS — R140 Abdominal distension (gaseous): Secondary | ICD-10-CM | POA: Insufficient documentation

## 2014-06-07 DIAGNOSIS — F419 Anxiety disorder, unspecified: Secondary | ICD-10-CM | POA: Diagnosis not present

## 2014-06-07 DIAGNOSIS — Z872 Personal history of diseases of the skin and subcutaneous tissue: Secondary | ICD-10-CM | POA: Diagnosis not present

## 2014-06-07 DIAGNOSIS — R7989 Other specified abnormal findings of blood chemistry: Secondary | ICD-10-CM | POA: Diagnosis not present

## 2014-06-07 DIAGNOSIS — Z8719 Personal history of other diseases of the digestive system: Secondary | ICD-10-CM | POA: Insufficient documentation

## 2014-06-07 DIAGNOSIS — Z792 Long term (current) use of antibiotics: Secondary | ICD-10-CM | POA: Diagnosis not present

## 2014-06-07 HISTORY — DX: Gastrointestinal hemorrhage, unspecified: K92.2

## 2014-06-07 LAB — CBC WITH DIFFERENTIAL/PLATELET
BASOS PCT: 1 % (ref 0–1)
Basophils Absolute: 0 10*3/uL (ref 0.0–0.1)
EOS ABS: 0.1 10*3/uL (ref 0.0–0.7)
Eosinophils Relative: 2 % (ref 0–5)
HCT: 26.9 % — ABNORMAL LOW (ref 36.0–46.0)
HEMOGLOBIN: 8.9 g/dL — AB (ref 12.0–15.0)
Lymphocytes Relative: 21 % (ref 12–46)
Lymphs Abs: 0.9 10*3/uL (ref 0.7–4.0)
MCH: 29.5 pg (ref 26.0–34.0)
MCHC: 33.1 g/dL (ref 30.0–36.0)
MCV: 89.1 fL (ref 78.0–100.0)
MONO ABS: 0.5 10*3/uL (ref 0.1–1.0)
Monocytes Relative: 13 % — ABNORMAL HIGH (ref 3–12)
NEUTROS PCT: 63 % (ref 43–77)
Neutro Abs: 2.6 10*3/uL (ref 1.7–7.7)
PLATELETS: 194 10*3/uL (ref 150–400)
RBC: 3.02 MIL/uL — AB (ref 3.87–5.11)
RDW: 17.4 % — AB (ref 11.5–15.5)
WBC: 4.1 10*3/uL (ref 4.0–10.5)

## 2014-06-07 LAB — COMPREHENSIVE METABOLIC PANEL
ALT: 62 U/L — ABNORMAL HIGH (ref 0–35)
AST: 131 U/L — ABNORMAL HIGH (ref 0–37)
Albumin: 1.5 g/dL — ABNORMAL LOW (ref 3.5–5.2)
Alkaline Phosphatase: 1219 U/L — ABNORMAL HIGH (ref 39–117)
Anion gap: 5 (ref 5–15)
BILIRUBIN TOTAL: 1.2 mg/dL (ref 0.3–1.2)
BUN: 15 mg/dL (ref 6–23)
CO2: 18 mmol/L — ABNORMAL LOW (ref 19–32)
Calcium: 7.4 mg/dL — ABNORMAL LOW (ref 8.4–10.5)
Chloride: 111 mmol/L (ref 96–112)
Creatinine, Ser: 1.03 mg/dL (ref 0.50–1.10)
GFR calc Af Amer: 67 mL/min — ABNORMAL LOW (ref >90)
GFR calc non Af Amer: 57 mL/min — ABNORMAL LOW (ref >90)
Glucose, Bld: 119 mg/dL — ABNORMAL HIGH (ref 70–99)
POTASSIUM: 4.9 mmol/L (ref 3.5–5.1)
SODIUM: 134 mmol/L — AB (ref 135–145)
Total Protein: 5.5 g/dL — ABNORMAL LOW (ref 6.0–8.3)

## 2014-06-07 LAB — PROTIME-INR
INR: 1.25 (ref 0.00–1.49)
PROTHROMBIN TIME: 15.8 s — AB (ref 11.6–15.2)

## 2014-06-07 MED ORDER — FUROSEMIDE 10 MG/ML IJ SOLN
40.0000 mg | Freq: Once | INTRAMUSCULAR | Status: AC
  Administered 2014-06-07: 40 mg via INTRAVENOUS
  Filled 2014-06-07: qty 4

## 2014-06-07 MED ORDER — LIDOCAINE-EPINEPHRINE 1 %-1:100000 IJ SOLN
30.0000 mL | Freq: Once | INTRAMUSCULAR | Status: AC
  Administered 2014-06-07: 30 mL via INTRADERMAL
  Filled 2014-06-07: qty 1

## 2014-06-07 MED ORDER — HEPARIN SOD (PORK) LOCK FLUSH 100 UNIT/ML IV SOLN
500.0000 [IU] | Freq: Once | INTRAVENOUS | Status: AC
  Administered 2014-06-07: 500 [IU]
  Filled 2014-06-07: qty 5

## 2014-06-07 NOTE — ED Notes (Signed)
Pt from home, reports that she is being tx for bile duct CA by [facility]. Pt sts that she has not had radiation for 6 mths, never had chemo. Pt reports that she received email from PA that she needed to have K+ checked since she has hx of low K+. Pt adds that she has been taking lasix, but was advised to stop taking 5 days ago. Pt reports that she has bilateral leg and abd swelling. Pt had paracentesis 5 days ago but feels now she has "more fluid" and is short of breath. Pt denies CP. Pt adds that she has hx of hepatic encephalopathy. Pt is now A&O and in NAD and walks with steady gait.

## 2014-06-07 NOTE — ED Notes (Signed)
Nurse drawing from port.

## 2014-06-07 NOTE — ED Notes (Signed)
Pt requesting port to be accessed for blood work. Port placed at [facility] and no imaging done here previously to confirm placement of line. Will access and obtain blood work after chest xray confirms placement of port.

## 2014-06-07 NOTE — ED Provider Notes (Signed)
CSN: [id number]     Arrival date & time 06/07/14  1138 History   First MD Initiated Contact with Patient 06/07/14 1142     Chief Complaint  Patient presents with  . Abnormal Lab  . Leg Swelling  . Bloated     (Consider location/radiation/quality/duration/timing/severity/associated sxs/prior Treatment) Patient is a 59 y.o. female presenting with shortness of breath. The history is provided by the patient.  Shortness of Breath Severity:  Mild Onset quality:  Gradual Duration:  5 days Timing:  Constant Progression:  Worsening Chronicity:  Recurrent Context comment:  Ascites Relieved by:  Nothing Worsened by:  Nothing tried Ineffective treatments:  Diuretics Associated symptoms: no abdominal pain, no chest pain, no cough, no fever, no headaches, no neck pain and no vomiting     Past Medical History  Diagnosis Date  . Anxiety   . Elevated liver enzymes 03-26-13  . Jaundice 03-26-13    Jaundice -presently skin and sclera  . DRESS syndrome   . Hepatic encephalopathy   . Cancer     Liver  . GI bleeding    Past Surgical History  Procedure Laterality Date  . Knee arthroscopy Bilateral     both knees  . Eus N/A 04/03/2013    Procedure: ESOPHAGEAL ENDOSCOPIC ULTRASOUND (EUS) RADIAL;  Surgeon: Arta Silence, MD;  Location: [facility] ENDOSCOPY;  Service: Endoscopy;  Laterality: N/A;  bx of liver lesion  . Liver resection    . Cholecystectomy     Family History  Problem Relation Age of Onset  . Hypertension Mother   . Hypertension Father    History  Substance Use Topics  . Smoking status: Never Smoker   . Smokeless tobacco: Not on file  . Alcohol Use: No   OB History    No data available     Review of Systems  Constitutional: Negative for fever and fatigue.  HENT: Negative for congestion and drooling.   Eyes: Negative for pain.  Respiratory: Positive for shortness of breath. Negative for cough.   Cardiovascular: Negative for chest pain.  Gastrointestinal: Negative for  nausea, vomiting, abdominal pain and diarrhea.  Genitourinary: Negative for dysuria and hematuria.  Musculoskeletal: Negative for back pain, gait problem and neck pain.  Skin: Negative for color change.  Neurological: Negative for dizziness and headaches.  Hematological: Negative for adenopathy.  Psychiatric/Behavioral: Negative for behavioral problems.  All other systems reviewed and are negative.     Allergies  Rocephin; Vancomycin; Zosyn; Amoxicillin; and Sulfa antibiotics  Home Medications   Prior to Admission medications   Medication Sig Start Date End Date Taking? Authorizing Provider  ciprofloxacin (CIPRO) 500 MG tablet Take 1 tablet (500 mg total) by mouth daily with breakfast. 02/12/14   Robbie Lis, MD  furosemide (LASIX) 20 MG tablet Take 20 mg by mouth daily.  04/28/14   Historical Provider, MD  KLOR-CON 25 MEQ PACK Take 25 mEq by mouth 2 (two) times daily. 06/03/14   Historical Provider, MD  lactulose (CHRONULAC) 10 GM/15ML solution Take 45 mLs (30 g total) by mouth 2 (two) times daily. 04/04/14   Bonnielee Haff, MD  metoCLOPramide (REGLAN) 5 MG tablet Take 5 mg by mouth 4 (four) times daily as needed for nausea (nausea).    Historical Provider, MD  nadolol (CORGARD) 20 MG tablet Take 10 mg by mouth daily.    Historical Provider, MD  pantoprazole (PROTONIX) 40 MG tablet Take 1 tablet (40 mg total) by mouth every 12 (twelve) hours. 02/12/14   Link Snuffer  Charlies Silvers, MD  rifaximin (XIFAXAN) 550 MG TABS tablet Take 1 tablet (550 mg total) by mouth 2 (two) times daily. 02/12/14   Robbie Lis, MD  sertraline (ZOLOFT) 25 MG tablet Take 25 mg by mouth every morning.    Historical Provider, MD  spironolactone (ALDACTONE) 50 MG tablet Take 1 tablet (50 mg total) by mouth daily. 02/12/14 02/12/15  Robbie Lis, MD  sucralfate (CARAFATE) 1 GM/10ML suspension Take 10 mLs (1 g total) by mouth 3 (three) times daily with meals. Patient taking differently: Take 1 g by mouth 4 (four) times daily.   02/12/14   Robbie Lis, MD   BP 121/76 mmHg  Pulse 78  Temp(Src) 98.1 F (36.7 C) (Oral)  Resp 20  SpO2 100% Physical Exam  Constitutional: She is oriented to person, place, and time. She appears well-developed and well-nourished.  HENT:  Head: Normocephalic.  Mouth/Throat: Oropharynx is clear and moist. No oropharyngeal exudate.  Eyes: Conjunctivae and EOM are normal. Pupils are equal, round, and reactive to light.  Neck: Normal range of motion. Neck supple.  Cardiovascular: Normal rate, regular rhythm, normal heart sounds and intact distal pulses.  Exam reveals no gallop and no friction rub.   No murmur heard. Pulmonary/Chest: Effort normal and breath sounds normal. No respiratory distress. She has no wheezes.  Abdominal: Soft. Bowel sounds are normal. She exhibits distension (mild to mod). There is no tenderness. There is no rebound and no guarding.  Musculoskeletal: Normal range of motion. She exhibits edema (moderate edema extending from the lower extremities up to the thighs bilaterally.). She exhibits no tenderness.  Neurological: She is alert and oriented to person, place, and time.  Skin: Skin is warm and dry.  Psychiatric: She has a normal mood and affect. Her behavior is normal.  Nursing note and vitals reviewed.   ED Course  PARACENTESIS Date/Time: 06/07/2014 3:30 PM Performed by: Pamella Pert Authorized by: Pamella Pert Consent: Verbal consent obtained. Written consent obtained. Risks and benefits: risks, benefits and alternatives were discussed Consent given by: patient Patient understanding: patient states understanding of the procedure being performed Patient consent: the patient's understanding of the procedure matches consent given Procedure consent: procedure consent matches procedure scheduled Relevant documents: relevant documents present and verified Test results: test results available and properly labeled Site marked: the operative site was  marked Required items: required blood products, implants, devices, and special equipment available Patient identity confirmed: verbally with patient, arm band, provided demographic data and hospital-assigned identification number Time out: Immediately prior to procedure a "time out" was called to verify the correct patient, procedure, equipment, support staff and site/side marked as required. Initial or subsequent exam: subsequent Procedure purpose: therapeutic Indications: abdominal discomfort secondary to ascites Anesthesia: local infiltration Local anesthetic: lidocaine 1% with epinephrine Anesthetic total: 3 ml Patient sedated: no Preparation: Patient was prepped and draped in the usual sterile fashion. Needle gauge: 18 Ultrasound guidance: [place] used to identify location on abdomen with adequate fluid buffer. Puncture site: left lower quadrant Fluid removed: 2200(ml) Fluid appearance: serous Dressing: band-aid. Patient tolerance: Patient tolerated the procedure well with no immediate complications   (including critical care time) Labs Review Labs Reviewed  COMPREHENSIVE METABOLIC PANEL - Abnormal; Notable for the following:    Sodium 134 (*)    CO2 18 (*)    Glucose, Bld 119 (*)    Calcium 7.4 (*)    Total Protein 5.5 (*)    Albumin 1.5 (*)    AST 131 (*)  ALT 62 (*)    Alkaline Phosphatase 1219 (*)    GFR calc non Af Amer 57 (*)    GFR calc Af Amer 67 (*)    All other components within normal limits  CBC WITH DIFFERENTIAL/PLATELET - Abnormal; Notable for the following:    RBC 3.02 (*)    Hemoglobin 8.9 (*)    HCT 26.9 (*)    RDW 17.4 (*)    Monocytes Relative 13 (*)    All other components within normal limits  PROTIME-INR - Abnormal; Notable for the following:    Prothrombin Time 15.8 (*)    All other components within normal limits    Imaging Review Dg Chest 2 View  06/07/2014   CLINICAL DATA:  59 year old female with a history of dyspnea. Swollen extremity.  History of liver carcinoma.  EXAM: CHEST - 2 VIEW  COMPARISON:  Chest x-ray 02/06/2014  FINDINGS: Cardiomediastinal silhouette unchanged in size and contour.  No evidence of pulmonary vascular congestion.  Blunting of the bilateral costophrenic angles and blunting of the costophrenic sulcus on the lateral view.  Patchy airspace opacities at the left base on the frontal view.  Interval placement of right chest wall port catheter from right IJ approach. Catheter appears to terminate at the superior cavoatrial junction.  No acute bony abnormality.  Unremarkable appearance of the upper abdomen  IMPRESSION: Interval development of airspace opacities at the left base potentially atelectasis, consolidation, and/ or edema. Small bilateral pleural effusions. Less likely these opacities may reflect metastatic disease. Follow-up chest x-ray recommended once the patient has been treated for this acute process.  Interval placement of right-sided IJ approach port catheter.  Signed,  Dulcy Fanny. Earleen Newport, DO  [facility]  [facility]   Electronically Signed   By: Corrie Mckusick D.O.   On: 06/07/2014 13:02     EKG Interpretation   Date/Time:  Saturday June 07 2014 13:04:11 EDT Ventricular Rate:  84 PR Interval:  148 QRS Duration: 76 QT Interval:  406 QTC Calculation: 480 R Axis:   90 Text Interpretation:  Sinus rhythm Paired ventricular premature complexes  Borderline right axis deviation Low voltage, extremity and precordial  leads Nonspecific T abnormalities, lateral leads Confirmed by Joey Hudock   MD, Charleston Hankin ([id number]) on 06/07/2014 3:32:40 PM      MDM   Final diagnoses:  SOB (shortness of breath)  Abdominal distension    12:23 PM 59 y.o. female with a history of bile duct cancer who presents with concern for lab abnormalities and bloating. She states that she was found to have hypokalemia 5 days ago and her Lasix was discontinued. She was started on spironolactone  at that time. She had plans to get repeat lab work done yesterday but this was not able to be followed up upon by her doctor. She presents now for evaluation of her potassium and also worsening swelling in her lower extremities and abdomen over the last week. She does have some mild shortness of breath and abdominal distention. She has had 4 previous paracentesis including one 5 days ago for accumulation of ascites related to her cancer. She is afebrile and vital signs are unremarkable here. She appears well on exam. Will get screening labs and imaging.  3:32 PM: Paracentesis performed at the bedside by me without complication. Patient monitored afterwards with relief of shortness of breath. I gave her a dose of Lasix here and recommended she take it over the weekend and discuss continuing this medication with  her doctor on Monday. I removed 2.2 L of fluid from her abdomen. It appeared straw-colored and serous. Studies were not sent.  I have discussed the diagnosis/risks/treatment options with the patient and believe the pt to be eligible for discharge home to follow-up with her pcp. We also discussed returning to the ED immediately if new or worsening sx occur. We discussed the sx which are most concerning (e.g., worsening distension, worsening sob, fever, abd pain) that necessitate immediate return. Medications administered to the patient during their visit and any new prescriptions provided to the patient are listed below.  Medications given during this visit Medications  lidocaine-EPINEPHrine (XYLOCAINE W/EPI) 1 %-1:100000 (with pres) injection 30 mL (30 mLs Intradermal Given by Other 06/07/14 1447)  furosemide (LASIX) injection 40 mg (40 mg Intravenous Given 06/07/14 1457)    New Prescriptions   No medications on file     Pamella Pert, MD 06/07/14 1612

## 2014-06-07 NOTE — ED Notes (Signed)
Paracentesis performed at bedside by Dr. Aline Brochure. Approx 2114ml fluid removed. Pt denies any concerns or complaints.

## 2014-09-02 ENCOUNTER — Inpatient Hospital Stay (HOSPITAL_COMMUNITY): Payer: BLUE CROSS/BLUE SHIELD

## 2014-09-02 ENCOUNTER — Inpatient Hospital Stay (HOSPITAL_COMMUNITY)
Admission: EM | Admit: 2014-09-02 | Discharge: 2014-09-11 | DRG: 853 | Disposition: A | Discharge status: Home Health | Payer: BLUE CROSS/BLUE SHIELD | Attending: Family Medicine | Admitting: Family Medicine

## 2014-09-02 ENCOUNTER — Encounter (HOSPITAL_COMMUNITY): Payer: Self-pay | Admitting: Emergency Medicine

## 2014-09-02 ENCOUNTER — Emergency Department (HOSPITAL_COMMUNITY): Payer: BLUE CROSS/BLUE SHIELD

## 2014-09-02 DIAGNOSIS — K766 Portal hypertension: Secondary | ICD-10-CM | POA: Diagnosis present

## 2014-09-02 DIAGNOSIS — E875 Hyperkalemia: Secondary | ICD-10-CM | POA: Diagnosis present

## 2014-09-02 DIAGNOSIS — Z882 Allergy status to sulfonamides status: Secondary | ICD-10-CM | POA: Diagnosis not present

## 2014-09-02 DIAGNOSIS — K254 Chronic or unspecified gastric ulcer with hemorrhage: Secondary | ICD-10-CM | POA: Diagnosis present

## 2014-09-02 DIAGNOSIS — E872 Acidosis: Secondary | ICD-10-CM | POA: Diagnosis present

## 2014-09-02 DIAGNOSIS — K750 Abscess of liver: Secondary | ICD-10-CM | POA: Diagnosis present

## 2014-09-02 DIAGNOSIS — K651 Peritoneal abscess: Secondary | ICD-10-CM | POA: Diagnosis present

## 2014-09-02 DIAGNOSIS — K922 Gastrointestinal hemorrhage, unspecified: Secondary | ICD-10-CM | POA: Diagnosis not present

## 2014-09-02 DIAGNOSIS — N179 Acute kidney failure, unspecified: Secondary | ICD-10-CM | POA: Diagnosis present

## 2014-09-02 DIAGNOSIS — E43 Unspecified severe protein-calorie malnutrition: Secondary | ICD-10-CM | POA: Diagnosis present

## 2014-09-02 DIAGNOSIS — K7290 Hepatic failure, unspecified without coma: Secondary | ICD-10-CM | POA: Diagnosis not present

## 2014-09-02 DIAGNOSIS — R29898 Other symptoms and signs involving the musculoskeletal system: Secondary | ICD-10-CM | POA: Diagnosis not present

## 2014-09-02 DIAGNOSIS — R7881 Bacteremia: Secondary | ICD-10-CM | POA: Diagnosis not present

## 2014-09-02 DIAGNOSIS — J9690 Respiratory failure, unspecified, unspecified whether with hypoxia or hypercapnia: Secondary | ICD-10-CM

## 2014-09-02 DIAGNOSIS — A4159 Other Gram-negative sepsis: Principal | ICD-10-CM | POA: Diagnosis present

## 2014-09-02 DIAGNOSIS — J811 Chronic pulmonary edema: Secondary | ICD-10-CM | POA: Diagnosis present

## 2014-09-02 DIAGNOSIS — Z880 Allergy status to penicillin: Secondary | ICD-10-CM

## 2014-09-02 DIAGNOSIS — Z978 Presence of other specified devices: Secondary | ICD-10-CM

## 2014-09-02 DIAGNOSIS — L0291 Cutaneous abscess, unspecified: Secondary | ICD-10-CM | POA: Diagnosis not present

## 2014-09-02 DIAGNOSIS — J189 Pneumonia, unspecified organism: Secondary | ICD-10-CM | POA: Diagnosis not present

## 2014-09-02 DIAGNOSIS — K7682 Hepatic encephalopathy: Secondary | ICD-10-CM

## 2014-09-02 DIAGNOSIS — E876 Hypokalemia: Secondary | ICD-10-CM | POA: Diagnosis not present

## 2014-09-02 DIAGNOSIS — R197 Diarrhea, unspecified: Secondary | ICD-10-CM | POA: Diagnosis present

## 2014-09-02 DIAGNOSIS — J988 Other specified respiratory disorders: Secondary | ICD-10-CM | POA: Diagnosis not present

## 2014-09-02 DIAGNOSIS — D696 Thrombocytopenia, unspecified: Secondary | ICD-10-CM | POA: Diagnosis present

## 2014-09-02 DIAGNOSIS — R6521 Severe sepsis with septic shock: Secondary | ICD-10-CM | POA: Diagnosis not present

## 2014-09-02 DIAGNOSIS — N170 Acute kidney failure with tubular necrosis: Secondary | ICD-10-CM | POA: Diagnosis not present

## 2014-09-02 DIAGNOSIS — G9340 Encephalopathy, unspecified: Secondary | ICD-10-CM | POA: Diagnosis present

## 2014-09-02 DIAGNOSIS — R0682 Tachypnea, not elsewhere classified: Secondary | ICD-10-CM

## 2014-09-02 DIAGNOSIS — R1011 Right upper quadrant pain: Secondary | ICD-10-CM | POA: Diagnosis present

## 2014-09-02 DIAGNOSIS — R579 Shock, unspecified: Secondary | ICD-10-CM

## 2014-09-02 DIAGNOSIS — Z8249 Family history of ischemic heart disease and other diseases of the circulatory system: Secondary | ICD-10-CM | POA: Diagnosis not present

## 2014-09-02 DIAGNOSIS — E162 Hypoglycemia, unspecified: Secondary | ICD-10-CM | POA: Diagnosis present

## 2014-09-02 DIAGNOSIS — Z923 Personal history of irradiation: Secondary | ICD-10-CM

## 2014-09-02 DIAGNOSIS — D684 Acquired coagulation factor deficiency: Secondary | ICD-10-CM | POA: Diagnosis present

## 2014-09-02 DIAGNOSIS — K7460 Unspecified cirrhosis of liver: Secondary | ICD-10-CM | POA: Diagnosis present

## 2014-09-02 DIAGNOSIS — Z881 Allergy status to other antibiotic agents status: Secondary | ICD-10-CM | POA: Diagnosis not present

## 2014-09-02 DIAGNOSIS — R188 Other ascites: Secondary | ICD-10-CM | POA: Diagnosis present

## 2014-09-02 DIAGNOSIS — I10 Essential (primary) hypertension: Secondary | ICD-10-CM | POA: Diagnosis present

## 2014-09-02 DIAGNOSIS — D689 Coagulation defect, unspecified: Secondary | ICD-10-CM | POA: Diagnosis present

## 2014-09-02 DIAGNOSIS — A419 Sepsis, unspecified organism: Secondary | ICD-10-CM | POA: Insufficient documentation

## 2014-09-02 DIAGNOSIS — C221 Intrahepatic bile duct carcinoma: Secondary | ICD-10-CM | POA: Diagnosis present

## 2014-09-02 DIAGNOSIS — Y842 Radiological procedure and radiotherapy as the cause of abnormal reaction of the patient, or of later complication, without mention of misadventure at the time of the procedure: Secondary | ICD-10-CM | POA: Diagnosis present

## 2014-09-02 DIAGNOSIS — J9811 Atelectasis: Secondary | ICD-10-CM | POA: Diagnosis present

## 2014-09-02 DIAGNOSIS — Z9049 Acquired absence of other specified parts of digestive tract: Secondary | ICD-10-CM | POA: Diagnosis present

## 2014-09-02 DIAGNOSIS — K2960 Other gastritis without bleeding: Secondary | ICD-10-CM | POA: Diagnosis present

## 2014-09-02 DIAGNOSIS — T66XXXA Radiation sickness, unspecified, initial encounter: Secondary | ICD-10-CM | POA: Diagnosis present

## 2014-09-02 DIAGNOSIS — I8500 Esophageal varices without bleeding: Secondary | ICD-10-CM | POA: Diagnosis present

## 2014-09-02 DIAGNOSIS — Z9221 Personal history of antineoplastic chemotherapy: Secondary | ICD-10-CM

## 2014-09-02 DIAGNOSIS — K7200 Acute and subacute hepatic failure without coma: Secondary | ICD-10-CM | POA: Diagnosis present

## 2014-09-02 DIAGNOSIS — E871 Hypo-osmolality and hyponatremia: Secondary | ICD-10-CM | POA: Diagnosis present

## 2014-09-02 DIAGNOSIS — Y95 Nosocomial condition: Secondary | ICD-10-CM | POA: Diagnosis present

## 2014-09-02 DIAGNOSIS — R4182 Altered mental status, unspecified: Secondary | ICD-10-CM | POA: Diagnosis present

## 2014-09-02 DIAGNOSIS — G9341 Metabolic encephalopathy: Secondary | ICD-10-CM | POA: Diagnosis present

## 2014-09-02 DIAGNOSIS — J9600 Acute respiratory failure, unspecified whether with hypoxia or hypercapnia: Secondary | ICD-10-CM | POA: Diagnosis not present

## 2014-09-02 DIAGNOSIS — D62 Acute posthemorrhagic anemia: Secondary | ICD-10-CM | POA: Diagnosis present

## 2014-09-02 DIAGNOSIS — C801 Malignant (primary) neoplasm, unspecified: Secondary | ICD-10-CM

## 2014-09-02 DIAGNOSIS — F419 Anxiety disorder, unspecified: Secondary | ICD-10-CM | POA: Diagnosis present

## 2014-09-02 DIAGNOSIS — R571 Hypovolemic shock: Secondary | ICD-10-CM | POA: Diagnosis present

## 2014-09-02 DIAGNOSIS — I8510 Secondary esophageal varices without bleeding: Secondary | ICD-10-CM | POA: Diagnosis present

## 2014-09-02 DIAGNOSIS — K2980 Duodenitis without bleeding: Secondary | ICD-10-CM | POA: Diagnosis present

## 2014-09-02 DIAGNOSIS — Z79899 Other long term (current) drug therapy: Secondary | ICD-10-CM | POA: Diagnosis not present

## 2014-09-02 DIAGNOSIS — D649 Anemia, unspecified: Secondary | ICD-10-CM | POA: Diagnosis present

## 2014-09-02 DIAGNOSIS — E8770 Fluid overload, unspecified: Secondary | ICD-10-CM | POA: Diagnosis present

## 2014-09-02 DIAGNOSIS — B961 Klebsiella pneumoniae [K. pneumoniae] as the cause of diseases classified elsewhere: Secondary | ICD-10-CM | POA: Diagnosis present

## 2014-09-02 DIAGNOSIS — K2981 Duodenitis with bleeding: Secondary | ICD-10-CM | POA: Diagnosis present

## 2014-09-02 LAB — BASIC METABOLIC PANEL
Anion gap: 19 — ABNORMAL HIGH (ref 5–15)
BUN: 78 mg/dL — ABNORMAL HIGH (ref 6–20)
CHLORIDE: 95 mmol/L — AB (ref 101–111)
CO2: 15 mmol/L — ABNORMAL LOW (ref 22–32)
Calcium: 6.5 mg/dL — ABNORMAL LOW (ref 8.9–10.3)
Creatinine, Ser: 2.13 mg/dL — ABNORMAL HIGH (ref 0.44–1.00)
GFR calc non Af Amer: 24 mL/min — ABNORMAL LOW (ref >60)
GFR, EST AFRICAN AMERICAN: 28 mL/min — AB (ref >60)
Glucose, Bld: 262 mg/dL — ABNORMAL HIGH (ref 65–99)
Potassium: 4.2 mmol/L (ref 3.5–5.1)
Sodium: 129 mmol/L — ABNORMAL LOW (ref 135–145)

## 2014-09-02 LAB — BLOOD GAS, VENOUS
ACID-BASE DEFICIT: 18.4 mmol/L — AB (ref 0.0–2.0)
Bicarbonate: 7.2 mEq/L — ABNORMAL LOW (ref 20.0–24.0)
DRAWN BY: 103701
O2 SAT: 66 %
Patient temperature: 98.6
TCO2: 6.6 mmol/L (ref 0–100)
pCO2, Ven: 16.8 mmHg — ABNORMAL LOW (ref 45.0–50.0)
pH, Ven: 7.256 (ref 7.250–7.300)
pO2, Ven: 42 mmHg (ref 30.0–45.0)

## 2014-09-02 LAB — BLOOD GAS, ARTERIAL
Acid-base deficit: 5.6 mmol/L — ABNORMAL HIGH (ref 0.0–2.0)
Bicarbonate: 15.6 mEq/L — ABNORMAL LOW (ref 20.0–24.0)
Drawn by: 345601
O2 CONTENT: 2 L/min
O2 Saturation: 98 %
PCO2 ART: 16.6 mmHg — AB (ref 35.0–45.0)
PH ART: 7.581 — AB (ref 7.350–7.450)
PO2 ART: 92.8 mmHg (ref 80.0–100.0)
Patient temperature: 99
TCO2: 14.7 mmol/L (ref 0–100)

## 2014-09-02 LAB — CBC WITH DIFFERENTIAL/PLATELET
BASOS ABS: 0 10*3/uL (ref 0.0–0.1)
BASOS PCT: 0 % (ref 0–1)
EOS ABS: 0 10*3/uL (ref 0.0–0.7)
EOS PCT: 0 % (ref 0–5)
HCT: 30.7 % — ABNORMAL LOW (ref 36.0–46.0)
Hemoglobin: 10.1 g/dL — ABNORMAL LOW (ref 12.0–15.0)
LYMPHS ABS: 1.1 10*3/uL (ref 0.7–4.0)
Lymphocytes Relative: 5 % — ABNORMAL LOW (ref 12–46)
MCH: 27.4 pg (ref 26.0–34.0)
MCHC: 32.9 g/dL (ref 30.0–36.0)
MCV: 83.2 fL (ref 78.0–100.0)
MONOS PCT: 9 % (ref 3–12)
Monocytes Absolute: 2 10*3/uL — ABNORMAL HIGH (ref 0.1–1.0)
Neutro Abs: 18.8 10*3/uL — ABNORMAL HIGH (ref 1.7–7.7)
Neutrophils Relative %: 86 % — ABNORMAL HIGH (ref 43–77)
Platelets: 73 10*3/uL — ABNORMAL LOW (ref 150–400)
RBC: 3.69 MIL/uL — ABNORMAL LOW (ref 3.87–5.11)
RDW: 17.3 % — AB (ref 11.5–15.5)
WBC MORPHOLOGY: INCREASED
WBC: 21.9 10*3/uL — AB (ref 4.0–10.5)

## 2014-09-02 LAB — URINALYSIS, ROUTINE W REFLEX MICROSCOPIC
BILIRUBIN URINE: NEGATIVE
GLUCOSE, UA: NEGATIVE mg/dL
HGB URINE DIPSTICK: NEGATIVE
Ketones, ur: NEGATIVE mg/dL
LEUKOCYTES UA: NEGATIVE
Nitrite: NEGATIVE
PROTEIN: NEGATIVE mg/dL
SPECIFIC GRAVITY, URINE: 1.015 (ref 1.005–1.030)
Urobilinogen, UA: 0.2 mg/dL (ref 0.0–1.0)
pH: 5 (ref 5.0–8.0)

## 2014-09-02 LAB — COMPREHENSIVE METABOLIC PANEL
ALBUMIN: 1.8 g/dL — AB (ref 3.5–5.0)
ALT: 139 U/L — ABNORMAL HIGH (ref 14–54)
AST: 353 U/L — ABNORMAL HIGH (ref 15–41)
Alkaline Phosphatase: 707 U/L — ABNORMAL HIGH (ref 38–126)
Anion gap: 20 — ABNORMAL HIGH (ref 5–15)
BILIRUBIN TOTAL: 1.6 mg/dL — AB (ref 0.3–1.2)
BUN: 75 mg/dL — ABNORMAL HIGH (ref 6–20)
CALCIUM: 7.9 mg/dL — AB (ref 8.9–10.3)
CHLORIDE: 98 mmol/L — AB (ref 101–111)
CO2: 8 mmol/L — ABNORMAL LOW (ref 22–32)
Creatinine, Ser: 2.58 mg/dL — ABNORMAL HIGH (ref 0.44–1.00)
GFR calc Af Amer: 22 mL/min — ABNORMAL LOW (ref >60)
GFR calc non Af Amer: 19 mL/min — ABNORMAL LOW (ref >60)
GLUCOSE: 57 mg/dL — AB (ref 65–99)
Potassium: 5.6 mmol/L — ABNORMAL HIGH (ref 3.5–5.1)
SODIUM: 126 mmol/L — AB (ref 135–145)
Total Protein: 6.1 g/dL — ABNORMAL LOW (ref 6.5–8.1)

## 2014-09-02 LAB — I-STAT CHEM 8, ED
BUN: 77 mg/dL — AB (ref 6–20)
CHLORIDE: 102 mmol/L (ref 101–111)
Calcium, Ion: 1.04 mmol/L — ABNORMAL LOW (ref 1.13–1.30)
Creatinine, Ser: 2.8 mg/dL — ABNORMAL HIGH (ref 0.44–1.00)
GLUCOSE: 47 mg/dL — AB (ref 65–99)
HEMATOCRIT: 22 % — AB (ref 36.0–46.0)
HEMOGLOBIN: 7.5 g/dL — AB (ref 12.0–15.0)
POTASSIUM: 5.9 mmol/L — AB (ref 3.5–5.1)
SODIUM: 125 mmol/L — AB (ref 135–145)
TCO2: 8 mmol/L (ref 0–100)

## 2014-09-02 LAB — PREPARE RBC (CROSSMATCH)

## 2014-09-02 LAB — LACTIC ACID, PLASMA
Lactic Acid, Venous: 10.4 mmol/L (ref 0.5–2.0)
Lactic Acid, Venous: 9.3 mmol/L (ref 0.5–2.0)

## 2014-09-02 LAB — PROTIME-INR
INR: 1.92 — ABNORMAL HIGH (ref 0.00–1.49)
Prothrombin Time: 21.9 seconds — ABNORMAL HIGH (ref 11.6–15.2)

## 2014-09-02 LAB — I-STAT CG4 LACTIC ACID, ED: Lactic Acid, Venous: 10.61 mmol/L (ref 0.5–2.0)

## 2014-09-02 LAB — AMMONIA: Ammonia: 176 umol/L — ABNORMAL HIGH (ref 9–35)

## 2014-09-02 LAB — PROCALCITONIN: Procalcitonin: 45.91 ng/mL

## 2014-09-02 LAB — MRSA PCR SCREENING: MRSA by PCR: NEGATIVE

## 2014-09-02 LAB — CBG MONITORING, ED: Glucose-Capillary: 43 mg/dL — CL (ref 65–99)

## 2014-09-02 MED ORDER — ONDANSETRON HCL 4 MG/2ML IJ SOLN
4.0000 mg | Freq: Four times a day (QID) | INTRAMUSCULAR | Status: DC | PRN
  Administered 2014-09-02 – 2014-09-08 (×3): 4 mg via INTRAVENOUS
  Filled 2014-09-02 (×3): qty 2

## 2014-09-02 MED ORDER — NOREPINEPHRINE BITARTRATE 1 MG/ML IV SOLN
2.0000 ug/min | INTRAVENOUS | Status: DC
  Administered 2014-09-03: 2 ug/min via INTRAVENOUS
  Filled 2014-09-02 (×2): qty 4

## 2014-09-02 MED ORDER — SODIUM CHLORIDE 0.9 % IV SOLN: 10.0000 mL/h | Freq: Once | INTRAVENOUS | Status: DC

## 2014-09-02 MED ORDER — LACTULOSE 10 GM/15ML PO SOLN
30.0000 g | Freq: Two times a day (BID) | ORAL | Status: DC
  Filled 2014-09-02: qty 45

## 2014-09-02 MED ORDER — SODIUM CHLORIDE 0.9 % IV SOLN
Freq: Once | INTRAVENOUS | Status: AC
  Administered 2014-09-02: 22:00:00 via INTRAVENOUS

## 2014-09-02 MED ORDER — SODIUM CHLORIDE 0.9 % IV BOLUS (SEPSIS)
1000.0000 mL | Freq: Once | INTRAVENOUS | Status: AC
  Administered 2014-09-02: 1000 mL via INTRAVENOUS

## 2014-09-02 MED ORDER — SODIUM CHLORIDE 0.9 % IV SOLN: Freq: Once | INTRAVENOUS | Status: DC

## 2014-09-02 MED ORDER — PANTOPRAZOLE SODIUM 40 MG IV SOLR
8.0000 mg/h | INTRAVENOUS | Status: AC
  Administered 2014-09-02 – 2014-09-05 (×8): 8 mg/h via INTRAVENOUS
  Filled 2014-09-02 (×15): qty 80

## 2014-09-02 MED ORDER — VITAMINS A & D EX OINT
TOPICAL_OINTMENT | CUTANEOUS | Status: AC
  Administered 2014-09-02: 5
  Filled 2014-09-02: qty 5

## 2014-09-02 MED ORDER — RIFAXIMIN 550 MG PO TABS
550.0000 mg | ORAL_TABLET | Freq: Two times a day (BID) | ORAL | Status: DC
  Administered 2014-09-03 – 2014-09-04 (×3): 550 mg
  Filled 2014-09-02 (×5): qty 1

## 2014-09-02 MED ORDER — SODIUM BICARBONATE 8.4 % IV SOLN
INTRAVENOUS | Status: DC
  Administered 2014-09-02: 16:00:00 via INTRAVENOUS
  Filled 2014-09-02: qty 150

## 2014-09-02 MED ORDER — SODIUM CHLORIDE 0.9 % IV SOLN
250.0000 mL | INTRAVENOUS | Status: DC | PRN
  Administered 2014-09-03: 250 mL via INTRAVENOUS

## 2014-09-02 MED ORDER — PANTOPRAZOLE SODIUM 40 MG IV SOLR
40.0000 mg | Freq: Two times a day (BID) | INTRAVENOUS | Status: DC
  Administered 2014-09-06 – 2014-09-08 (×5): 40 mg via INTRAVENOUS
  Filled 2014-09-02 (×5): qty 40

## 2014-09-02 MED ORDER — DEXTROSE 50 % IV SOLN
1.0000 | Freq: Once | INTRAVENOUS | Status: AC
  Administered 2014-09-02: 50 mL via INTRAVENOUS
  Filled 2014-09-02: qty 50

## 2014-09-02 MED ORDER — VITAMIN K1 10 MG/ML IJ SOLN
10.0000 mg | Freq: Once | INTRAVENOUS | Status: AC
  Administered 2014-09-02: 10 mg via INTRAVENOUS
  Filled 2014-09-02: qty 1

## 2014-09-02 MED ORDER — SODIUM BICARBONATE 8.4 % IV SOLN
50.0000 meq | Freq: Once | INTRAVENOUS | Status: AC
  Administered 2014-09-02: 50 meq via INTRAVENOUS
  Filled 2014-09-02: qty 50

## 2014-09-02 MED ORDER — ONDANSETRON HCL 4 MG/2ML IJ SOLN
4.0000 mg | Freq: Once | INTRAMUSCULAR | Status: AC
  Administered 2014-09-02: 4 mg via INTRAVENOUS
  Filled 2014-09-02: qty 2

## 2014-09-02 MED ORDER — DEXTROSE 5 % IV SOLN
INTRAVENOUS | Status: DC
  Administered 2014-09-02: 20:00:00 via INTRAVENOUS
  Filled 2014-09-02 (×4): qty 150

## 2014-09-02 MED ORDER — CIPROFLOXACIN IN D5W 400 MG/200ML IV SOLN
400.0000 mg | Freq: Once | INTRAVENOUS | Status: AC
  Administered 2014-09-02: 400 mg via INTRAVENOUS
  Filled 2014-09-02: qty 200

## 2014-09-02 MED ORDER — SODIUM CHLORIDE 0.9 % IV SOLN
250.0000 mg | Freq: Two times a day (BID) | INTRAVENOUS | Status: DC
  Administered 2014-09-03: 250 mg via INTRAVENOUS
  Filled 2014-09-02: qty 250

## 2014-09-02 MED ORDER — SODIUM CHLORIDE 0.9 % IV SOLN
80.0000 mg | Freq: Once | INTRAVENOUS | Status: AC
  Administered 2014-09-02: 80 mg via INTRAVENOUS
  Filled 2014-09-02: qty 80

## 2014-09-02 MED ORDER — OCTREOTIDE LOAD VIA INFUSION
50.0000 ug | Freq: Once | INTRAVENOUS | Status: AC
  Administered 2014-09-02: 50 ug via INTRAVENOUS
  Filled 2014-09-02: qty 25

## 2014-09-02 MED ORDER — SUCRALFATE 1 GM/10ML PO SUSP
1.0000 g | Freq: Three times a day (TID) | ORAL | Status: DC
  Administered 2014-09-02 – 2014-09-04 (×5): 1 g
  Filled 2014-09-02 (×8): qty 10

## 2014-09-02 MED ORDER — PANTOPRAZOLE SODIUM 40 MG IV SOLR: 40.0000 mg | Freq: Two times a day (BID) | INTRAVENOUS | Status: DC

## 2014-09-02 MED ORDER — SODIUM CHLORIDE 0.9 % IV SOLN
500.0000 mg | Freq: Four times a day (QID) | INTRAVENOUS | Status: DC
  Administered 2014-09-02: 500 mg via INTRAVENOUS
  Filled 2014-09-02 (×4): qty 500

## 2014-09-02 MED ORDER — SODIUM CHLORIDE 0.9 % IV SOLN
50.0000 ug/h | INTRAVENOUS | Status: DC
  Administered 2014-09-02 – 2014-09-05 (×5): 50 ug/h via INTRAVENOUS
  Filled 2014-09-02 (×20): qty 1

## 2014-09-02 MED ORDER — LINEZOLID 600 MG/300ML IV SOLN
600.0000 mg | Freq: Two times a day (BID) | INTRAVENOUS | Status: DC
  Administered 2014-09-02 – 2014-09-03 (×2): 600 mg via INTRAVENOUS
  Filled 2014-09-02 (×2): qty 300

## 2014-09-02 NOTE — ED Notes (Signed)
Ultrasound at bedside

## 2014-09-02 NOTE — Progress Notes (Signed)
[facility] Progress Note Patient Name: CATHIE BONNELL DOB: 1955/08/29 MRN: [id number]   Date of Service  09/02/2014  HPI/Events of Note  Lactic acidosis  eICU Interventions  Bolus 1L bicarb drip, push 1 amp bicarb, repeat labs after.      Intervention Category Major Interventions: Acid-Base disturbance - evaluation and management  Hasani Diemer 09/02/2014, 6:00 PM

## 2014-09-02 NOTE — ED Notes (Addendum)
ISTAT Lactic 10.61 RN Lillia Mountain, DR Ayesha Rumpf notified

## 2014-09-02 NOTE — ED Notes (Signed)
Pt noted to have some blood around her nares.

## 2014-09-02 NOTE — ED Notes (Signed)
Pt w/ hx of liver cancer and hepatic encephalopathy.  Pt's husband, who cares for her, states that for about 2 months, pt was constipated even though she was taking lactulose regularly.  On Sunday, pt began having lots of diarrhea, as she expected to have with the lactulose.  Husband states that since then, she has been having confusion and altered mental status.  Husband states that on the way here, pt began vomiting blood "and lots of it".  Pt is A&O x 4 but it takes her longer to remember where exactly she is.

## 2014-09-02 NOTE — ED Notes (Signed)
Nurse accessing port 

## 2014-09-02 NOTE — ED Notes (Signed)
MD at bedside. 

## 2014-09-02 NOTE — ED Provider Notes (Signed)
CSN: [id number]     Arrival date & time 09/02/14  1142 History   First MD Initiated Contact with Patient 09/02/14 1153     Chief Complaint  Patient presents with  . Altered Mental Status     Patient is a 59 y.o. female presenting with altered mental status. The history is provided by the patient and the spouse. No language interpreter was used.  Altered Mental Status  Ms. Joann Castaneda presents for evaluation of altered mental status. She has a history of cholangiocarcinoma and hepatic encephalopathy. The last 2 weeks she has had less stool output with her lactulose. 2 days ago she felt diffuse diarrhea that was brown in color. For the last 2 days her husband reports that she has been more confused and has had increased generalized weakness and difficulty with ambulation. Just prior to ED arrival she developed hematemesis, large amount of dark vomits. She had one bowel movement today that was dark in color. She has no change in her chronic abdominal pain. No reports of fevers or head injury. No dysuria.  Symptoms are severe, constant, worsening.  Past Medical History  Diagnosis Date  . Anxiety   . Elevated liver enzymes 03-26-13  . Jaundice 03-26-13    Jaundice -presently skin and sclera  . DRESS syndrome   . Hepatic encephalopathy   . Cancer     Liver  . GI bleeding    Past Surgical History  Procedure Laterality Date  . Knee arthroscopy Bilateral     both knees  . Eus N/A 04/03/2013    Procedure: ESOPHAGEAL ENDOSCOPIC ULTRASOUND (EUS) RADIAL;  Surgeon: Arta Silence, MD;  Location: [facility];  Service: Endoscopy;  Laterality: N/A;  bx of liver lesion  . Liver resection    . Cholecystectomy     Family History  Problem Relation Age of Onset  . Hypertension Mother   . Hypertension Father    History  Substance Use Topics  . Smoking status: Never Smoker   . Smokeless tobacco: Not on file  . Alcohol Use: No   OB History    No data available     Review of Systems  All other  systems reviewed and are negative.     Allergies  Rocephin; Vancomycin; Zosyn; Amoxicillin; and Sulfa antibiotics  Home Medications   Prior to Admission medications   Medication Sig Start Date End Date Taking? Authorizing Provider  ciprofloxacin (CIPRO) 500 MG tablet Take 1 tablet (500 mg total) by mouth daily with breakfast. 02/12/14  Yes Robbie Lis, MD  lactulose (CHRONULAC) 10 GM/15ML solution Take 45 mLs (30 g total) by mouth 2 (two) times daily. Patient taking differently: Take 30 g by mouth 3 (three) times daily.  04/04/14  Yes Bonnielee Haff, MD  furosemide (LASIX) 20 MG tablet Take 20 mg by mouth daily.  04/28/14   Historical Provider, MD  KLOR-CON 25 MEQ PACK Take 25 mEq by mouth 2 (two) times daily. 06/03/14   Historical Provider, MD  metoCLOPramide (REGLAN) 5 MG tablet Take 5 mg by mouth 4 (four) times daily as needed for nausea (nausea).    Historical Provider, MD  nadolol (CORGARD) 20 MG tablet Take 10 mg by mouth daily.    Historical Provider, MD  pantoprazole (PROTONIX) 40 MG tablet Take 1 tablet (40 mg total) by mouth every 12 (twelve) hours. 02/12/14   Robbie Lis, MD  rifaximin (XIFAXAN) 550 MG TABS tablet Take 1 tablet (550 mg total) by mouth 2 (two) times daily. 02/12/14  Robbie Lis, MD  spironolactone (ALDACTONE) 50 MG tablet Take 1 tablet (50 mg total) by mouth daily. 02/12/14 02/12/15  Robbie Lis, MD  sucralfate (CARAFATE) 1 GM/10ML suspension Take 10 mLs (1 g total) by mouth 3 (three) times daily with meals. Patient taking differently: Take 1 g by mouth 4 (four) times daily.  02/12/14   Robbie Lis, MD   BP 100/51 mmHg  Pulse 96  Temp(Src) 97.9 F (36.6 C) (Oral)  Resp 16  SpO2 100% Physical Exam  Constitutional: She is oriented to person, place, and time. She appears well-developed and well-nourished.  HENT:  Head: Normocephalic and atraumatic.  Dried blood in bilateral nares  Cardiovascular: Normal rate and regular rhythm.   No murmur  heard. Pulmonary/Chest: Effort normal and breath sounds normal. No respiratory distress.  Abdominal: Soft. There is no rebound and no guarding.  Mild epigastric tenderness  Musculoskeletal: She exhibits no edema or tenderness.  Neurological: She is alert and oriented to person, place, and time.  Generalized weakness, mildly confused  Skin: Skin is warm and dry.  Psychiatric: She has a normal mood and affect. Her behavior is normal.  Nursing note and vitals reviewed.   ED Course  Procedures (including critical care time) CRITICAL CARE Performed by: Quintella Reichert   Total critical care time: 30 minutes  Critical care time was exclusive of separately billable procedures and treating other patients.  Critical care was necessary to treat or prevent imminent or life-threatening deterioration.  Critical care was time spent personally by me on the following activities: development of treatment plan with patient and/or surrogate as well as nursing, discussions with consultants, evaluation of patient's response to treatment, examination of patient, obtaining history from patient or surrogate, ordering and performing treatments and interventions, ordering and review of laboratory studies, ordering and review of radiographic studies, pulse oximetry and re-evaluation of patient's condition.  Labs Review Labs Reviewed  COMPREHENSIVE METABOLIC PANEL - Abnormal; Notable for the following:    Sodium 126 (*)    Potassium 5.6 (*)    Chloride 98 (*)    CO2 8 (*)    Glucose, Bld 57 (*)    BUN 75 (*)    Creatinine, Ser 2.58 (*)    Calcium 7.9 (*)    Total Protein 6.1 (*)    Albumin 1.8 (*)    AST <5 (*)    ALT <5 (*)    Alkaline Phosphatase 707 (*)    Total Bilirubin 1.6 (*)    GFR calc non Af Amer 19 (*)    GFR calc Af Amer 22 (*)    Anion gap 20 (*)    All other components within normal limits  CBC WITH DIFFERENTIAL/PLATELET - Abnormal; Notable for the following:    WBC 21.9 (*)    RBC  3.69 (*)    Hemoglobin 10.1 (*)    HCT 30.7 (*)    RDW 17.3 (*)    Platelets 73 (*)    Neutrophils Relative % 86 (*)    Lymphocytes Relative 5 (*)    Neutro Abs 18.8 (*)    Monocytes Absolute 2.0 (*)    All other components within normal limits  PROTIME-INR - Abnormal; Notable for the following:    Prothrombin Time 21.9 (*)    INR 1.92 (*)    All other components within normal limits  AMMONIA - Abnormal; Notable for the following:    Ammonia 176 (*)    All other components within normal  limits  BLOOD GAS, VENOUS - Abnormal; Notable for the following:    pCO2, Ven 16.8 (*)    Bicarbonate 7.2 (*)    Acid-base deficit 18.4 (*)    All other components within normal limits  I-STAT CG4 LACTIC ACID, ED - Abnormal; Notable for the following:    Lactic Acid, Venous 10.61 (*)    All other components within normal limits  I-STAT CHEM 8, ED - Abnormal; Notable for the following:    Sodium 125 (*)    Potassium 5.9 (*)    BUN 77 (*)    Creatinine, Ser 2.80 (*)    Glucose, Bld 47 (*)    Calcium, Ion 1.04 (*)    Hemoglobin 7.5 (*)    HCT 22.0 (*)    All other components within normal limits  CBG MONITORING, ED - Abnormal; Notable for the following:    Glucose-Capillary 43 (*)    All other components within normal limits  CULTURE, BLOOD (ROUTINE X 2)  CULTURE, BLOOD (ROUTINE X 2)  URINE CULTURE  URINALYSIS, ROUTINE W REFLEX MICROSCOPIC (NOT AT [facility])  TYPE AND SCREEN  PREPARE RBC (CROSSMATCH)  PREPARE RBC (CROSSMATCH)    Imaging Review No results found.   EKG Interpretation None      MDM   Final diagnoses:  Acute hepatic encephalopathy  Acute renal failure, unspecified acute renal failure type    Pt here for progressive AMS, hematemesis just prior to ED arrival.  Pt with ARF, Hgb increased compared to priors, suspect some degree of contraction due to dehydration. Pt provided IVF, pRBC not available in ED due to antibodies. Hypoglycemic - provided dextrose.  Discussed with  intensivist regarding admission for GI bleed, ARF, hepatic encephalopathy.      Quintella Reichert, MD 09/03/14 [phone]

## 2014-09-02 NOTE — Progress Notes (Signed)
PULMONARY / CRITICAL CARE MEDICINE   Name: Joann Castaneda MRN: [id number] DOB: May 02, 1955    ADMISSION DATE:  09/02/2014 CONSULTATION DATE:  09/02/14  REFERRING MD :  ED physician  CHIEF COMPLAINT:  Hematemesis/hypotension  INITIAL PRESENTATION: 59 yo female with PMH of resection of cholangiocarcinoma 05/06/14 (further sx details below) w/ h/o portal hypertension and ascites post radiation and chemotherapy. She was  admitted to [facility] 7/12 after progressive worsening of encephalopathy despite continued lactulose and rifaximin. No sig stools for several days, then from 7/10 to 7/12 had been stooling constantly. Her weakness and fatigue continued to worsen, then had episode of hematemesis 7/12 which further prompted admission. One more recurrence of hematemesis on arrival to ED. On presentation she was hypotensive (in 90s) w/ LA of 10. Her initial Hgb was 10 but dropped to 7.5 in the ER. Admitted to the CCM service w/ working dx of possible dehydration, UGIB and possible sepsis.   STUDIES:  7/12 RUQ [place] >>> post partial resection, complex cystic lesion with questionable abscess vs mass.  SIGNIFICANT EVENTS: 7/12: Admitted with GI bleed/hematemesis, worsening encephalopathy   SUBJECTIVE:  Pt has had 1 large occurrence of bloody emesis - not bright red but also not very dark.  Hgb dropped from 10.1 on admission > 7.5 > 5.8. BP now down to 90/45 (SBP in 100's earlier this evening). Lactic acid basically unchanged from 10.6 on admission > 10.4.   VITAL SIGNS: Temp:  [97.3 F (36.3 C)-99.9 F (37.7 C)] 99 F (37.2 C) (07/12 2133) Pulse Rate:  [75-134] 134 (07/12 2133) Resp:  [16-34] 31 (07/12 2200) BP: (95-120)/(36-59) 99/43 mmHg (07/12 2200) SpO2:  [91 %-100 %] 95 % (07/12 2145) Weight:  [51.4 kg (113 lb 5.1 oz)] 51.4 kg (113 lb 5.1 oz) (07/12 1700) HEMODYNAMICS:   VENTILATOR SETTINGS: none   INTAKE / OUTPUT:  Intake/Output Summary (Last 24 hours) at 09/02/14 2238 Last data filed at  09/02/14 2156  Gross per 24 hour  Intake 760.42 ml  Output      0 ml  Net 760.42 ml    PHYSICAL EXAMINATION: General:  Chronically ill, jaundiced, cachexic female lying in bed. Neuro:Alert but somewhat confused. Able to answer some questions and follow commands. No asterixis. HEENT: PERRL, dried blood on anterior neck and in oropharynx Cardiovascular:  RRR, no M/R/G Lungs:  CTA, no W/R/R Abdomen:  Scarring on abd, rounded with RUQ tenderness and palpable liver border Musculoskeletal:  Diffuse muscle wasting Skin:  Jaundiced without spider angiomas  LABS:  CBC  Recent Labs Lab 09/02/14 1231 09/02/14 1359 09/02/14 2100  WBC 21.9*  --  15.4*  HGB 10.1* 7.5* 5.8*  HCT 30.7* 22.0* 17.3*  PLT 73*  --  PENDING   Coag's  Recent Labs Lab 09/02/14 1231  INR 1.92*   BMET  Recent Labs Lab 09/02/14 1231 09/02/14 1359 09/02/14 2100  NA 126* 125* 129*  K 5.6* 5.9* 4.2  CL 98* 102 95*  CO2 8*  --  15*  BUN 75* 77* 78*  CREATININE 2.58* 2.80* 2.13*  GLUCOSE 57* 47* 262*   Electrolytes  Recent Labs Lab 09/02/14 1231 09/02/14 2100  CALCIUM 7.9* 6.5*   Sepsis Markers  Recent Labs Lab 09/02/14 1254 09/02/14 1644  LATICACIDVEN 10.61* 10.4*   ABG No results for input(s): PHART, PCO2ART, PO2ART in the last 168 hours. Liver Enzymes  Recent Labs Lab 09/02/14 1231  AST 353*  ALT 139*  ALKPHOS 707*  BILITOT 1.6*  ALBUMIN 1.8*  Cardiac Enzymes No results for input(s): TROPONINI, PROBNP in the last 168 hours. Glucose  Recent Labs Lab 09/02/14 1353  GLUCAP 43*    Imaging Dg Chest Port 1 View  09/02/2014   CLINICAL DATA:  Altered mental status, diarrhea and hematemesis for 2 days. History of liver cancer.  EXAM: PORTABLE CHEST - 1 VIEW  COMPARISON:  06/07/2014  FINDINGS: The power port is stable. The cardiac silhouette, mediastinal and hilar contours are normal. The lungs are clear. No pleural effusions or pneumothorax. Surgical changes noted in the  right upper abdomen.  IMPRESSION: No acute cardiopulmonary findings.   Electronically Signed   By: Marijo Sanes M.D.   On: 09/02/2014 14:49   US Abdomen Limited Ruq  09/02/2014   CLINICAL DATA:  59 year old female with history of liver cancer post liver resection. Prior cholecystectomy. Nausea and vomiting with abdominal pain for 3 days. Subsequent encounter.  EXAM: US ABDOMEN LIMITED - RIGHT UPPER QUADRANT  COMPARISON:  02/07/2014 ultrasound.  FINDINGS: Gallbladder:  Post cholecystectomy.  Common bile duct:  Diameter: 2.3 mm.  Pneumobilia.  Liver:  Heterogeneous appearance of the liver with complex 2.3 x 1.9 x 1.8 cm cystic lesion within the right lobe of the liver. Etiology indeterminate. Question abscess or mass or focal bile collection. Pneumobilia.  Small amount of ascites.  IMPRESSION: Post partial resection of the liver. Complex cystic lesion within the residual liver measures up to 2.3 cm. Question abscess versus mass or possibly focal bile collection.  Pneumobilia.  Small amount of ascites.   Electronically Signed   By: Genia Del M.D.   On: 09/02/2014 17:01     ASSESSMENT / PLAN:  PULMONARY OETT A: At risk aspiration and / or respiratory failure requiring intubation. Tachypnea --> in setting of metabolic acidosis. P:   Keep O2 sats >92%. Monitor respiratory status. Keep HOB elevated. Pulmonary hygiene. At risk intubation. CXR in AM.  CARDIOVASCULAR CVL >>> Port  A:  Shock: unclear if this is hypovolemia w/ acute blood loss or sepsis - favor hypovolemia / hemorrhagic given acute drop in Hgb. Tachycardia - compensatory due to above. P:  Check troponin. Continue IVF resuscitation. 2u PRBC now. Levophed PRN to maintain goal MAP > 65. Stat cortisol. If cortisol < 20, start stress steroids.  RENAL A:   AKI. Lactic Acidosis/ AG Acidosis with + non-anion gap comp: likely from bicarb loss from freq stools. Hyperkalemia - resolved. Hyponatremia. P:   Bicarb gtt @ 125;  watch for worsening hyponatremia.\ Trend lactate. Serial chemistries. Strict I&O's. Renal dose meds.  GASTROINTESTINAL A:   Portal hypertension - s/p resection of cholangiocarcinoma (05/06/14) Varices. RUQ pain. Hematemesis. Hyperbilirubinemia. Elevated alk phos. P:   NGT. Cont Lactulose & Rifaximin. Cont octreotide, protonix.  Consult GI Warren Lacy MD to call tonight).  HEMATOLOGIC A:   Acute blood loss anemia - unclear source of bleeding at this time. Thrombocytopenia. Coagulopathy. P:  2u PRBC and maintain Hgb > 7. Monitor INR. Additional 2u PRBC's on hold. H/H q6hrs.  INFECTIOUS A:   R/o Cholangitis vs SBP vs UTI.  Possible aspiration. P:   BCx2 7/12 >>> UC  7/12>>> Abx:  Cipro 7/12>>>7/12 one dose (chronic cipro use) Linezolid 7/12>>> Imepenem 7/12>>> Check PCT, consider early d/c abx.  ENDOCRINE A:   Hypoglycemia > resolved. P:   Stat cortisol. If cortisol < 20, start stress steroids.  NEUROLOGIC A:   Hepatic encephalopathy. P:   RASS goal: 0 Continue rifaximin and lactulose therapy.   FAMILY  - Updates:  Husband at bedside.  Husband states that pt has had her prior care at [facility] and that he has spoken with them.  [facility] is willing to accept pt once she is stabilized.  I have informed husband that we will re-assess things in the morning and if pt is stable enough for transport, we will consider transfer to [facility] for further management.  - Inter-disciplinary family meet or Palliative Care meeting due by:  7/19   Additional CC time:  45 minutes.   Montey Hora, [place] Pager: [phone]  or [phone] 09/02/2014, 10:53 PM

## 2014-09-02 NOTE — H&P (Addendum)
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 She was  admitted to [facility] 7/12 after progressive worsening of encephalopathy despite continued lactulose and rifaximin. No sig stools for several days, then from 7/10 to 7/12 had been stooling constantly. Her weakness and fatigue continued to worsen, then had episode of hematemesis 7/12 which further prompted admission. One more recurrence of hematemesis on arrival to ED. On presentation she was hypotensive (in 90s) w/ LA of 10. Her initial Hgb was 10 but dropped to 7.5 in the ER. Admitted to the CCM service w/ working dx of possible dehydration, UGIB and possible sepsis.   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 A hilar mass extending into the left intrahepatic ducts was noted during the operative process with staging pT2b pN1 pMX. Since she has had chemotherapy and radiation therapy and has developed symptoms of portal hypertension and ascites. She has had intermittently required paracentesis and has experienced approximately 15lbs since 1/16. Since 7/10 she has experience progressive encephalopathy and weakness despite continuing her lactulose and rifaximin therapy her husband states she only had 1 bowel movement per day from 7/1 to 7/10. Her husband notes  from 7/10-7/11. On 7/10 she began to  Have 4 to 5 loose stools per day. She presented to [facility] ED 7/12 after an episode of hematemesis at home with one more episode occurring on arrival to ED. She was found to be hypotensive with SBPs in 90-100 with a lactic acid 10. Her hemoglobin and hct have dropped precipitously from 10 to 7.5 with IVF resuscitation. PCCM was consulted to admit and manage patient. Due to antibodies the patient is unable to be transfused at this time. On arrival the patient is sitting upright on stretcher, tachypnea in the 30s. She is able to answer some questions but is noted to be encephalopathic. We are continuing IVF resuscitation, IV abx, we will monitor hemoglobin and Hct trend, available and obtain a stat RUQ [place] to look for obstruction and consult gastroenterology. She will be admitted to the ICU for further management.   PAST MEDICAL HISTORY :   has a past medical history of Anxiety; Elevated liver enzymes (03-26-13); Jaundice (03-26-13); DRESS syndrome; Hepatic encephalopathy; Cancer; and GI bleeding.  has past surgical history that includes Knee arthroscopy (Bilateral); EUS (N/A, 04/03/2013); Liver resection; and Cholecystectomy. Prior to Admission medications   Medication Sig Start Date End Date Taking? Authorizing Provider  ciprofloxacin (CIPRO) 500 MG tablet Take 1 tablet (500 mg total) by mouth daily with breakfast. 02/12/14  Yes Robbie Lis, MD  furosemide (LASIX) 40 MG tablet Take 40 mg by mouth daily. 08/04/14  Yes Historical Provider, MD  lactulose (CHRONULAC) 10 GM/15ML solution Take 45 mLs (30 g total) by mouth 2 (two) times daily. Patient taking differently: Take 30 g by mouth 3 (three) times daily.  04/04/14  Yes Bonnielee Haff, MD  metoCLOPramide (REGLAN) 5 MG tablet Take  5 mg by mouth 4 (four) times daily as needed for nausea (nausea).   Yes Historical Provider, MD  nadolol (CORGARD) 20 MG tablet Take 10 mg by mouth daily.   Yes Historical Provider, MD  ondansetron  (ZOFRAN-ODT) 4 MG disintegrating tablet Take 4 mg by mouth at bedtime as needed for nausea.  06/10/14  Yes Historical Provider, MD  pantoprazole (PROTONIX) 40 MG tablet Take 1 tablet (40 mg total) by mouth every 12 (twelve) hours. 02/12/14  Yes Robbie Lis, MD  rifaximin (XIFAXAN) 550 MG TABS tablet Take 1 tablet (550 mg total) by mouth 2 (two) times daily. 02/12/14  Yes Robbie Lis, MD  spironolactone (ALDACTONE) 50 MG tablet Take 1 tablet (50 mg total) by mouth daily. 02/12/14 02/12/15 Yes Robbie Lis, MD  sucralfate (CARAFATE) 1 GM/10ML suspension Take 10 mLs (1 g total) by mouth 3 (three) times daily with meals. Patient not taking: Reported on 09/02/2014 02/12/14   Robbie Lis, MD   Allergies  Allergen Reactions  . Rocephin [Ceftriaxone] Other (See Comments)    Dress syndrome  . Vancomycin Other (See Comments)    Dress syndrome.   Marland Kitchen Zosyn [Piperacillin Sod-Tazobactam So]     Dress syndrome  . Amoxicillin Rash  . Sulfa Antibiotics Rash    FAMILY HISTORY:  has no family status information on file.  SOCIAL HISTORY:  reports that she has never smoked. She does not have any smokeless tobacco history on file. She reports that she does not drink alcohol or use illicit drugs.   VITAL SIGNS: Temp:  [97.3 F (36.3 C)-97.9 F (36.6 C)] 97.3 F (36.3 C) (07/12 1550) Pulse Rate:  [92-101] 92 (07/12 1550) Resp:  [16-29] 26 (07/12 1550) BP: (95-102)/(51-54) 100/52 mmHg (07/12 1550) SpO2:  [91 %-100 %] 100 % (07/12 1550) HEMODYNAMICS:   VENTILATOR SETTINGS: none   INTAKE / OUTPUT: No intake or output data in the 24 hours ending 09/02/14 1622  PHYSICAL EXAMINATION: General:  Chronically ill, jaundiced, cachexic female sitting upright on stretcher with increased WOB Neuro:Alert but confused. Able to answer some questions and follow commands. No asterixis. HEENT: PERRL 14m, blood with coffee ground appearance in back of oropharynx  Cardiovascular:  S1&S2 audible, RRR, no rubs,  gallops, or murmurs Lungs:  CTA Abdomen:  Scarring on abd, rounded with RUQ tenderness and palpable liver border Musculoskeletal:  wasting Skin:  Jaundiced without spider angiomas  LABS:  CBC  Recent Labs Lab 09/02/14 1231 09/02/14 1359  WBC 21.9*  --   HGB 10.1* 7.5*  HCT 30.7* 22.0*  PLT 73*  --    Coag's  Recent Labs Lab 09/02/14 1231  INR 1.92*   BMET  Recent Labs Lab 09/02/14 1231 09/02/14 1359  NA 126* 125*  K 5.6* 5.9*  CL 98* 102  CO2 8*  --   BUN 75* 77*  CREATININE 2.58* 2.80*  GLUCOSE 57* 47*   Electrolytes  Recent Labs Lab 09/02/14 1231  CALCIUM 7.9*   Sepsis Markers  Recent Labs Lab 09/02/14 1254  LATICACIDVEN 10.61*   ABG No results for input(s): PHART, PCO2ART, PO2ART in the last 168 hours. Liver Enzymes  Recent Labs Lab 09/02/14 1231  AST <5*  ALT <5*  ALKPHOS 707*  BILITOT 1.6*  ALBUMIN 1.8*   Cardiac Enzymes No results for input(s): TROPONINI, PROBNP in the last 168 hours. Glucose  Recent Labs Lab 09/02/14 1353  GLUCAP 43*    Imaging Dg Chest Port 1 View  09/02/2014  CLINICAL DATA:  Altered mental status, diarrhea and hematemesis for 2 days. History of liver cancer.  EXAM: PORTABLE CHEST - 1 VIEW  COMPARISON:  06/07/2014  FINDINGS: The power port is stable. The cardiac silhouette, mediastinal and hilar contours are normal. The lungs are clear. No pleural effusions or pneumothorax. Surgical changes noted in the right upper abdomen.  IMPRESSION: No acute cardiopulmonary findings.   Electronically Signed   By: Marijo Sanes M.D.   On: 09/02/2014 14:49     ASSESSMENT / PLAN:  PULMONARY OETT A: Tachypnea-->in setting of metabolic acidosis  P:   Keep O2 sats >92% Monitor respiratory status Continuous pulse ox  CARDIOVASCULAR CVL>>>Port  A:  Shock: unclear if this is hypovolemia w/ acute blood loss or sepsis Tachycardia P:  Repeat lactate Volume resuscitate Ck cortisol Stress dose steroids See ID  section   RENAL A:   AKI Lactic Acidosis/ AG Acidosis with + non-anion gap comp: likely from bicarb loss from freq stools Hyperkalemia Hyponatremia Dehydration  P:   Bicarb gtt @ 125; watch for worsening hyponatremia  Serial chemistries strick I&O Renal dose meds  GASTROINTESTINAL A:   Portal hypertension -s/p resection of cholangiocarcinoma (05/06/14) Varices RUQ pain Hematemesis Hyperbilirubinemia Elevated alk phos P:   RUQ [place] stat Place NGT  Cont Lactulose & Rifaximin Cont octreotide for now.  Protonix gtt May need GI input   HEMATOLOGIC A:   Acute blood loss anemia Thrombocytopenia Coagulopathy Antibodies to Blood- unable to obtain PRBC at this time P:  Monitor blood counts Monitor INR Obtain 4 units and place on hold F/u cbc and transfuse for hgb < 7 PAS   INFECTIOUS A:   R/o Cholangitis vs SBP vs UTI P:   BCx2 7/12 >>> UC  7/12>>>  Abx:  Cipro 7/12>>>7/12 one dose (chronic cipro use) Linezolid 7/12>>> Imepenem 7/12>>>  ENDOCRINE A:   Hypoglycemia> resolved P:   Monitor blood glucose q 4hr while NPO D5 in gtt Stat cortisol  NEUROLOGIC A:   Hepatic encephalopathy P:   RASS goal: 0 Continue rifaximin and lactulose therapy   FAMILY  - Updates:   - Inter-disciplinary family meet or Palliative Care meeting due by:  7/19    TODAY'S SUMMARY: Azaya Goedde is a 59 yo history with a complicated history s/p resection of cholangiocarcinoma (05/06/14) with subsequent symptoms of portal htn and recurrent ascities.  Admitted to the CCM service w/ working dx of possible dehydration, UGIB and possible sepsis. Will cont IV hydration w/ bicarb gtt, cont octreotide and PPI infusion, get RUQ [place] to look for evidence of obstruction also ascites. Send cultures, add empiric imipenem and zyvox. Hemodynamically seems to have improved. Needs admit to ICU   Erick Colace ACNP-BC [place] Pager # [phone] OR # [phone] if no  answer   09/02/2014, 4:22 PM   STAFF NOTE: I, Merrie Roof, MD FACP have personally reviewed patient's available data, including medical history, events of note, physical examination and test results as part of my evaluation. I have discussed with resident/NP and other care providers such as pharmacist, RN and RRT. In addition, I personally evaluated patient and elicited key findings of: mild distress , kusmall, Abdo mild tenderness, not impressed for fluid wave, concern is sepsis source, ruq?, port is clean, responding well to fluids, lactic elevated from sepsis an liver dsyfxn likely, needs RUQ now, assess obstruction, does not appear to have ascending cholangitis, family reports mRI q50months and q48month CT neg reoccurrence, assess cortisol, re assess lactic  acid level, concerns variceal bleed, no banding in past, place NGt with drop hct just back and ad ppi drip, low threshold addition octreotide, cbc to follow, empiric IMI, linazolid (follow plat closely), need aggressive empiric enterococcus coverage with ruq biliary risk as source, get KUB with tympanic bowels, may need GI consult, I updated husband and pt in full, ARF, atn? Hypovolemia, pos balance, give volume, at risk hepatorenal, assess urine na The patient is critically ill with multiple organ systems failure and requires high complexity decision making for assessment and support, frequent evaluation and titration of therapies, application of advanced monitoring technologies and extensive interpretation of multiple databases.   Critical Care Time devoted to patient care services described in this note is30 Minutes. This time reflects time of care of this signee: Merrie Roof, MD FACP. This critical care time does not reflect procedure time, or teaching time or supervisory time of PA/NP/Med student/Med Resident etc but could involve care discussion time. Rest per NP/medical resident whose note is outlined above and that I agree  with   Lavon Paganini. Titus Mould, MD, [place] Pgr: [facility] 09/02/2014 4:38 PM

## 2014-09-03 ENCOUNTER — Encounter (HOSPITAL_COMMUNITY): Payer: Self-pay | Admitting: Radiology

## 2014-09-03 ENCOUNTER — Inpatient Hospital Stay (HOSPITAL_COMMUNITY): Payer: BLUE CROSS/BLUE SHIELD

## 2014-09-03 ENCOUNTER — Encounter (HOSPITAL_COMMUNITY)
Admission: EM | Disposition: A | Discharge status: Home Health | Payer: Self-pay | Source: Home / Self Care | Attending: Internal Medicine

## 2014-09-03 DIAGNOSIS — A419 Sepsis, unspecified organism: Secondary | ICD-10-CM | POA: Insufficient documentation

## 2014-09-03 DIAGNOSIS — J189 Pneumonia, unspecified organism: Secondary | ICD-10-CM

## 2014-09-03 DIAGNOSIS — A4150 Gram-negative sepsis, unspecified: Secondary | ICD-10-CM

## 2014-09-03 DIAGNOSIS — G9341 Metabolic encephalopathy: Secondary | ICD-10-CM

## 2014-09-03 DIAGNOSIS — R6520 Severe sepsis without septic shock: Secondary | ICD-10-CM

## 2014-09-03 DIAGNOSIS — J988 Other specified respiratory disorders: Secondary | ICD-10-CM

## 2014-09-03 DIAGNOSIS — R6521 Severe sepsis with septic shock: Secondary | ICD-10-CM

## 2014-09-03 HISTORY — PX: ESOPHAGOGASTRODUODENOSCOPY: SHX5428

## 2014-09-03 LAB — CBC
HCT: 17.3 % — ABNORMAL LOW (ref 36.0–46.0)
HCT: 21.3 % — ABNORMAL LOW (ref 36.0–46.0)
HEMOGLOBIN: 5.8 g/dL — AB (ref 12.0–15.0)
Hemoglobin: 7.4 g/dL — ABNORMAL LOW (ref 12.0–15.0)
MCH: 26.9 pg (ref 26.0–34.0)
MCH: 28.4 pg (ref 26.0–34.0)
MCHC: 33.5 g/dL (ref 30.0–36.0)
MCHC: 34.7 g/dL (ref 30.0–36.0)
MCV: 80.1 fL (ref 78.0–100.0)
MCV: 81.6 fL (ref 78.0–100.0)
Platelets: 73 10*3/uL — ABNORMAL LOW (ref 150–400)
Platelets: 51 10*3/uL — ABNORMAL LOW (ref 150–400)
RBC: 2.16 MIL/uL — ABNORMAL LOW (ref 3.87–5.11)
RBC: 2.61 MIL/uL — AB (ref 3.87–5.11)
RDW: 17 % — AB (ref 11.5–15.5)
RDW: 16 % — ABNORMAL HIGH (ref 11.5–15.5)
WBC: 15.4 10*3/uL — ABNORMAL HIGH (ref 4.0–10.5)
WBC: 19.9 10*3/uL — AB (ref 4.0–10.5)

## 2014-09-03 LAB — GLUCOSE, CAPILLARY
Glucose-Capillary: 153 mg/dL — ABNORMAL HIGH (ref 65–99)
Glucose-Capillary: 134 mg/dL — ABNORMAL HIGH (ref 65–99)
Glucose-Capillary: 161 mg/dL — ABNORMAL HIGH (ref 65–99)

## 2014-09-03 LAB — BASIC METABOLIC PANEL
ANION GAP: 11 (ref 5–15)
Anion gap: 14 (ref 5–15)
Anion gap: 10 (ref 5–15)
BUN: 82 mg/dL — AB (ref 6–20)
BUN: 82 mg/dL — AB (ref 6–20)
BUN: 78 mg/dL — ABNORMAL HIGH (ref 6–20)
CHLORIDE: 97 mmol/L — AB (ref 101–111)
CHLORIDE: 100 mmol/L — AB (ref 101–111)
CO2: 22 mmol/L (ref 22–32)
CO2: 22 mmol/L (ref 22–32)
CO2: 20 mmol/L — AB (ref 22–32)
Calcium: 6.6 mg/dL — ABNORMAL LOW (ref 8.9–10.3)
Calcium: 7 mg/dL — ABNORMAL LOW (ref 8.9–10.3)
Calcium: 7.2 mg/dL — ABNORMAL LOW (ref 8.9–10.3)
Chloride: 101 mmol/L (ref 101–111)
Creatinine, Ser: 2.04 mg/dL — ABNORMAL HIGH (ref 0.44–1.00)
Creatinine, Ser: 1.95 mg/dL — ABNORMAL HIGH (ref 0.44–1.00)
Creatinine, Ser: 1.9 mg/dL — ABNORMAL HIGH (ref 0.44–1.00)
GFR calc Af Amer: 29 mL/min — ABNORMAL LOW (ref >60)
GFR calc Af Amer: 31 mL/min — ABNORMAL LOW (ref >60)
GFR calc non Af Amer: 25 mL/min — ABNORMAL LOW (ref >60)
GFR calc non Af Amer: 27 mL/min — ABNORMAL LOW (ref >60)
GFR, EST AFRICAN AMERICAN: 32 mL/min — AB (ref >60)
GFR, EST NON AFRICAN AMERICAN: 27 mL/min — AB (ref >60)
GLUCOSE: 144 mg/dL — AB (ref 65–99)
GLUCOSE: 144 mg/dL — AB (ref 65–99)
Glucose, Bld: 139 mg/dL — ABNORMAL HIGH (ref 65–99)
POTASSIUM: 3.7 mmol/L (ref 3.5–5.1)
Potassium: 3.4 mmol/L — ABNORMAL LOW (ref 3.5–5.1)
Potassium: 3.9 mmol/L (ref 3.5–5.1)
SODIUM: 133 mmol/L — AB (ref 135–145)
SODIUM: 133 mmol/L — AB (ref 135–145)
Sodium: 131 mmol/L — ABNORMAL LOW (ref 135–145)

## 2014-09-03 LAB — BLOOD GAS, ARTERIAL
Acid-base deficit: 2.3 mmol/L — ABNORMAL HIGH (ref 0.0–2.0)
BICARBONATE: 20.6 meq/L (ref 20.0–24.0)
Drawn by: 276051
FIO2: 0.5 %
MECHVT: 470 mL
O2 Saturation: 99.4 %
PEEP: 5 cmH2O
Patient temperature: 96
RATE: 18 resp/min
TCO2: 19.3 mmol/L (ref 0–100)
pCO2 arterial: 28 mmHg — ABNORMAL LOW (ref 35.0–45.0)
pH, Arterial: 7.474 — ABNORMAL HIGH (ref 7.350–7.450)
pO2, Arterial: 171 mmHg — ABNORMAL HIGH (ref 80.0–100.0)

## 2014-09-03 LAB — URINE CULTURE: Culture: NO GROWTH

## 2014-09-03 LAB — PREPARE RBC (CROSSMATCH)

## 2014-09-03 LAB — MAGNESIUM: Magnesium: 1.5 mg/dL — ABNORMAL LOW (ref 1.7–2.4)

## 2014-09-03 LAB — LACTIC ACID, PLASMA
LACTIC ACID, VENOUS: 6.2 mmol/L — AB (ref 0.5–2.0)
LACTIC ACID, VENOUS: 5.9 mmol/L — AB (ref 0.5–2.0)

## 2014-09-03 LAB — CORTISOL: Cortisol, Plasma: 40.1 ug/dL

## 2014-09-03 LAB — TROPONIN I: Troponin I: 0.05 ng/mL — ABNORMAL HIGH (ref <0.031)

## 2014-09-03 LAB — PHOSPHORUS: Phosphorus: 2.7 mg/dL (ref 2.5–4.6)

## 2014-09-03 LAB — PROTIME-INR
INR: 1.58 — ABNORMAL HIGH (ref 0.00–1.49)
PROTHROMBIN TIME: 18.9 s — AB (ref 11.6–15.2)

## 2014-09-03 LAB — HEMOGLOBIN AND HEMATOCRIT, BLOOD
HEMATOCRIT: 22.4 % — AB (ref 36.0–46.0)
Hemoglobin: 7.8 g/dL — ABNORMAL LOW (ref 12.0–15.0)

## 2014-09-03 LAB — SODIUM, URINE, RANDOM

## 2014-09-03 SURGERY — EGD (ESOPHAGOGASTRODUODENOSCOPY)
Anesthesia: Moderate Sedation

## 2014-09-03 MED ORDER — IOHEXOL 300 MG/ML  SOLN
25.0000 mL | INTRAMUSCULAR | Status: AC
  Administered 2014-09-03 (×2): 25 mL via ORAL

## 2014-09-03 MED ORDER — LACTULOSE 10 GM/15ML PO SOLN
30.0000 g | Freq: Two times a day (BID) | ORAL | Status: DC
  Administered 2014-09-03 – 2014-09-04 (×3): 30 g
  Filled 2014-09-03 (×3): qty 45

## 2014-09-03 MED ORDER — MIDAZOLAM HCL 5 MG/ML IJ SOLN
INTRAMUSCULAR | Status: AC
  Filled 2014-09-03: qty 2

## 2014-09-03 MED ORDER — POTASSIUM CHLORIDE 10 MEQ/50ML IV SOLN
10.0000 meq | INTRAVENOUS | Status: AC
  Administered 2014-09-03 (×2): 10 meq via INTRAVENOUS
  Filled 2014-09-03: qty 50

## 2014-09-03 MED ORDER — HYDROCORTISONE NA SUCCINATE PF 100 MG IJ SOLR
50.0000 mg | Freq: Four times a day (QID) | INTRAMUSCULAR | Status: DC
  Administered 2014-09-03 – 2014-09-04 (×4): 50 mg via INTRAVENOUS
  Filled 2014-09-03 (×4): qty 2

## 2014-09-03 MED ORDER — DIPHENHYDRAMINE HCL 50 MG/ML IJ SOLN
INTRAMUSCULAR | Status: AC
  Filled 2014-09-03: qty 1

## 2014-09-03 MED ORDER — LINEZOLID 600 MG/300ML IV SOLN
600.0000 mg | Freq: Two times a day (BID) | INTRAVENOUS | Status: DC
  Administered 2014-09-03: 600 mg via INTRAVENOUS
  Filled 2014-09-03 (×2): qty 300

## 2014-09-03 MED ORDER — SUCCINYLCHOLINE CHLORIDE 20 MG/ML IJ SOLN
INTRAMUSCULAR | Status: AC
  Filled 2014-09-03: qty 1

## 2014-09-03 MED ORDER — DEXTROSE-NACL 5-0.9 % IV SOLN
INTRAVENOUS | Status: DC
  Administered 2014-09-03 – 2014-09-08 (×7): via INTRAVENOUS

## 2014-09-03 MED ORDER — IMIPENEM-CILASTATIN 250 MG IV SOLR
250.0000 mg | Freq: Three times a day (TID) | INTRAVENOUS | Status: DC
  Administered 2014-09-03 – 2014-09-06 (×9): 250 mg via INTRAVENOUS
  Filled 2014-09-03 (×11): qty 250

## 2014-09-03 MED ORDER — FENTANYL CITRATE (PF) 100 MCG/2ML IJ SOLN
INTRAMUSCULAR | Status: AC
  Filled 2014-09-03: qty 2

## 2014-09-03 MED ORDER — MIDAZOLAM HCL 2 MG/2ML IJ SOLN
INTRAMUSCULAR | Status: AC
  Filled 2014-09-03: qty 2

## 2014-09-03 MED ORDER — LACTULOSE ENEMA
300.0000 mL | Freq: Two times a day (BID) | ORAL | Status: DC
  Filled 2014-09-03 (×3): qty 300

## 2014-09-03 MED ORDER — MAGNESIUM SULFATE 4 GM/100ML IV SOLN
4.0000 g | Freq: Once | INTRAVENOUS | Status: AC
  Administered 2014-09-03: 4 g via INTRAVENOUS
  Filled 2014-09-03: qty 100

## 2014-09-03 MED ORDER — HYDROCORTISONE NA SUCCINATE PF 100 MG IJ SOLR: 50.0000 mg | Freq: Four times a day (QID) | INTRAMUSCULAR | Status: DC

## 2014-09-03 MED ORDER — MIDAZOLAM HCL 10 MG/2ML IJ SOLN
INTRAMUSCULAR | Status: DC | PRN
  Administered 2014-09-03: 2 mg via INTRAVENOUS

## 2014-09-03 MED ORDER — ETOMIDATE 2 MG/ML IV SOLN
INTRAVENOUS | Status: AC
  Administered 2014-09-03: 20 mg
  Filled 2014-09-03: qty 20

## 2014-09-03 MED ORDER — CHLORHEXIDINE GLUCONATE 0.12 % MT SOLN
15.0000 mL | Freq: Two times a day (BID) | OROMUCOSAL | Status: DC
  Administered 2014-09-03 (×2): 15 mL via OROMUCOSAL
  Filled 2014-09-03: qty 15

## 2014-09-03 MED ORDER — LIDOCAINE HCL (CARDIAC) 20 MG/ML IV SOLN
INTRAVENOUS | Status: AC
  Filled 2014-09-03: qty 5

## 2014-09-03 MED ORDER — FENTANYL CITRATE (PF) 100 MCG/2ML IJ SOLN
INTRAMUSCULAR | Status: AC
  Administered 2014-09-03: 09:00:00
  Filled 2014-09-03: qty 2

## 2014-09-03 MED ORDER — ROCURONIUM BROMIDE 50 MG/5ML IV SOLN
INTRAVENOUS | Status: AC
  Administered 2014-09-03: 50 mg
  Filled 2014-09-03: qty 2

## 2014-09-03 MED ORDER — FENTANYL CITRATE (PF) 100 MCG/2ML IJ SOLN
INTRAMUSCULAR | Status: DC | PRN
  Administered 2014-09-03: 25 ug via INTRAVENOUS

## 2014-09-03 NOTE — Progress Notes (Signed)
CRITICAL VALUE ALERT  Critical value received:  Lactic Acid 5.9  Date of notification: 09/03/14  Time of notification:  1222  Critical value read back:Yes.    Nurse who received alert:  Duard Larsen, RN  MD notified (1st page):  Marni Griffon, NP  Time of first page:  1230  Lactic acid trending down

## 2014-09-03 NOTE — Progress Notes (Signed)
Initial Nutrition Assessment  DOCUMENTATION CODES:   Underweight  INTERVENTION:   If intubation expected to last > 24 hours, recommend nutrition support.  TF recommendations: Vital 1.5 @ 10 ml/hr via NGT and increase by 10 ml every 4 hours to goal rate of 40 ml/hr.  30 ml Prostat daily.   Tube feeding regimen provides 1540 kcal (100% of needs), 80 grams of protein, and 733 ml of H2O.   RD to continue to monitor  NUTRITION DIAGNOSIS:   Inadequate oral intake related to inability to eat as evidenced by NPO status.  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  Patient is currently intubated on ventilator support MV: 8.5 L/min Temp (24hrs), Avg:98.2 F (36.8 C), Min:95.4 F (35.2 C), Max:100.3 F (37.9 C)  Propofol: none  Nutrition-Focused physical exam completed. Findings are mild to moderate fat depletion, moderate muscle depletion, and no edema.   Labs reviewed: Low Na, Mg Elevated BUN & Creatinine Phos WNL Glucose 153  Diet Order:  Diet NPO time specified  Skin:  Reviewed, no issues  Last BM:  7/12  Height:   Ht Readings from Last 1 Encounters:  09/02/14 5\' 6"  (1.676 m)    Weight:   Wt Readings from Last 1 Encounters:  09/03/14 123 lb 10.9 oz (56.1 kg)    Ideal Body Weight:  59.1 kg  Wt Readings from Last 10 Encounters:  09/03/14 123 lb 10.9 oz (56.1 kg)  04/02/14 135 lb 2.3 oz (61.3 kg)  02/11/14 133 lb (60.328 kg)  04/03/13 123 lb (55.792 kg)    BMI:  Body mass index is 19.97 kg/(m^2).  Estimated Nutritional Needs: based on admission weight (51.4 kg)  Kcal:  1450-1650  Protein:  75-85g  Fluid:  1.6L/day  EDUCATION NEEDS:    No education needs identified at this time  Clayton Bibles, MS, RD, LDN Pager: [phone] After Hours Pager: [phone]

## 2014-09-03 NOTE — Procedures (Signed)
Central Venous Catheter Insertion Procedure Note Joann Castaneda [id number] June 01, 1955  Procedure: Insertion of Central Venous Catheter Indications: Assessment of intravascular volume  Procedure Details Consent: Risks of procedure as well as the alternatives and risks of each were explained to the (patient/caregiver).  Consent for procedure obtained. Time Out: Verified patient identification, verified procedure, site/side was marked, verified correct patient position, special equipment/implants available, medications/allergies/relevent history reviewed, required imaging and test results available.  Performed  Maximum sterile technique was used including antiseptics, cap, gloves, gown, hand hygiene, mask and sheet. Skin prep: Chlorhexidine; local anesthetic administered A antimicrobial bonded/coated triple lumen catheter was placed in the left internal jugular vein using the Seldinger technique.  Evaluation Blood flow good Complications: No apparent complications Patient did tolerate procedure well. Chest X-ray ordered to verify placement.  CXR: normal.  [name], MD [place] 09/03/2014, 11:46 AM Pager:  [phone] After 3pm call: [phone]

## 2014-09-03 NOTE — Progress Notes (Signed)
CT results reviewed. IR contacted for possible percutaneous drainage.   Erick Colace ACNP-BC [place] Pager # [phone] OR # [phone] if no answer

## 2014-09-03 NOTE — Progress Notes (Signed)
[name] Progress Note Patient Name: Joann Castaneda DOB: [birth year] MRN: [id number]   Date of Service  09/03/2014  HPI/Events of Note  GNR in blood MAP low Hb 7.4  eICU Interventions  Start levo gtt Dc bicarb gtt - bicarb improved      Intervention Category Major Interventions: Acid-Base disturbance - evaluation and management Intermediate Interventions: Hypotension - evaluation and management  [name] V. 09/03/2014, 4:26 AM

## 2014-09-03 NOTE — Op Note (Signed)
[facility] [place], [id number]   ENDOSCOPY PROCEDURE REPORT  PATIENT: Joann Castaneda, Joann Castaneda  MR#: [id number] BIRTHDATE: 15-Oct-1955 , 59  yrs. old GENDER: female ENDOSCOPIST: Teena Irani, MD REFERRED BY: PROCEDURE DATE:  2014-09-18 PROCEDURE: ASA CLASS: INDICATIONS:  hematemesis MEDICATIONS: fentanyl 25 g, Versed 2 mg TOPICAL ANESTHETIC:  DESCRIPTION OF PROCEDURE: After the risks benefits and alternatives of the procedure were thoroughly explained, informed consent was obtained.  The Pentax      endoscope was introduced through the mouth and advanced to the second portion of the duodenum , Without limitations.  The instrument was slowly withdrawn as the mucosa was fully examined. Estimated blood loss is zero unless otherwise noted in this procedure report.    esophagus, small nonbleeding varices with no stigmata of hemorrhage squamouscolumnar line sharply outlined stomach: Small to moderate amount of dark fluid in the stomach slight coffee-ground appearance. No bright or dark blood diffuse granularity and slight friability of the distal half the stomach. One small ulcer with adherent clot in the distal body along the lesser curvature. An Endo Clip was placed over it. Pylorus was patent. Duodenal bulb somewhat friable and granular but no active bleeding lesion. Second portion of the duodenum appeared normal retroflex view the cardia revealed no gastric varices or other lesions.          The scope was then withdrawn from the patient and the procedure completed.  COMPLICATIONS: There were no immediate complications.  ENDOSCOPIC IMPRESSION: small gastric ulcer with some stigmata of hemorrhage, no active bleeding no significant blood in the stomach Small nonbleeding esophageal varices Antral gastritis and duodenitis possibly from previous radiation  RECOMMENDATIONS: continue PPI and octreotide for now. Monitor stools and hemoglobin. Consider  nuclear medicine bleeding scan if bleeding continues and hemoglobin does not respond to further transfusion  REPEAT EXAM:  eSigned:  Teena Irani, MD 09-18-14 2:36 AM    CC:  CPT CODES: ICD CODES:  The ICD and CPT codes recommended by this software are interpretations from the data that the clinical staff has captured with the software.  The verification of the translation of this report to the ICD and CPT codes and modifiers is the sole responsibility of the health care institution and practicing physician where this report was generated.  [place]. will not be held responsible for the validity of the ICD and CPT codes included on this report.  AMA assumes no liability for data contained or not contained herein. CPT is a Designer, television/film set of the [employer].  PATIENT NAME:  [name], [name] MR#: [id number]

## 2014-09-03 NOTE — Progress Notes (Signed)
Patient ID: Joann Castaneda, female   DOB: 10-21-1955, 59 y.o.   MRN: [id number] Request received for image guided liver drain placement in patient. Imaging studies were reviewed with Dr. Pascal Lux and case discussed with Dr.Sood. At this time will hold on liver drain placement and if needed obtain diagnostic paracentesis to rule out SBP. Await CCM follow-up. Please contact Dr. Pascal Lux at [phone] with any additional questions.

## 2014-09-03 NOTE — Progress Notes (Signed)
CRITICAL VALUE ALERT  Critical value received:  Gram (-) Rods in both bottles blood cultures.    Date of notification:  09/03/14  Time of notification: 0400  Critical value read back:Yes.    Nurse who received alert:  Irene Pap, Rn  MD notified (1st page):  Elsworth Soho, MD (PCCM)  Time of first page:  [phone]  MD notified (2nd page):  [phone]  Time of second page:  Responding MD:  Elsworth Soho, MD   Time MD responded:  [id number]

## 2014-09-03 NOTE — Consult Note (Addendum)
[facility] Consult Note  Referring Provider: No ref. provider found Primary Care Physician:  Cari Caraway, MD Primary Gastroenterologist:  Dr.  Laurel Dimmer Complaint: Vomiting blood HPI: Joann Castaneda is an 59 y.o. 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 She's had some mild hypotension. The nurses notes no passage of blood in the rectum. She had an EGD due in April that showed grade 1 varices and improvement in duodenitis and gastritis felt second to be prior to previous radiation. She has had some hepatic encephalopathy and is very lethargic at present.  Past Medical History  Diagnosis Date  . Anxiety   . Elevated liver enzymes 03-26-13  . Jaundice 03-26-13    Jaundice -presently skin and sclera  . DRESS syndrome   . Hepatic encephalopathy   . Cancer     Liver  . GI bleeding     Past Surgical History  Procedure Laterality Date  . Knee arthroscopy Bilateral     both knees  . Eus N/A 04/03/2013    Procedure: ESOPHAGEAL ENDOSCOPIC ULTRASOUND (EUS) RADIAL;  Surgeon: Arta Silence, MD;  Location: [facility];  Service: Endoscopy;  Laterality: N/A;  bx of liver lesion  . Liver resection    . Cholecystectomy      Medications Prior to Admission  Medication Sig Dispense Refill  . ciprofloxacin (CIPRO) 500 MG tablet Take 1 tablet (500 mg total) by mouth daily with breakfast. 6 tablet 0  . furosemide (LASIX) 40 MG tablet Take 40 mg by mouth daily.  1  . lactulose (CHRONULAC) 10 GM/15ML solution Take 45 mLs (30 g total) by mouth 2 (two) times daily. (Patient taking differently: Take 30 g by mouth 3 (three) times daily. ) 240 mL 0  . metoCLOPramide (REGLAN) 5 MG tablet Take 5 mg by mouth 4 (four) times daily as needed for nausea (nausea).    . nadolol (CORGARD) 20 MG tablet Take 10 mg by mouth daily.    . ondansetron  (ZOFRAN-ODT) 4 MG disintegrating tablet Take 4 mg by mouth at bedtime as needed for nausea.   0  . pantoprazole (PROTONIX) 40 MG tablet Take 1 tablet (40 mg total) by mouth every 12 (twelve) hours. 60 tablet 0  . rifaximin (XIFAXAN) 550 MG TABS tablet Take 1 tablet (550 mg total) by mouth 2 (two) times daily. 60 tablet 0  . spironolactone (ALDACTONE) 50 MG tablet Take 1 tablet (50 mg total) by mouth daily. 30 tablet 0  . sucralfate (CARAFATE) 1 GM/10ML suspension Take 10 mLs (1 g total) by mouth 3 (three) times daily with meals. (Patient not taking: Reported on 09/02/2014) 420 mL 0    Allergies:  Allergies  Allergen Reactions  . Rocephin [Ceftriaxone] Other (See Comments)    Dress syndrome  . Vancomycin Other (See Comments)    Dress syndrome.   Marland Kitchen Zosyn [Piperacillin Sod-Tazobactam So]     Dress syndrome  . Amoxicillin Rash  . Sulfa Antibiotics Rash    Family History  Problem Relation Age of Onset  . Hypertension Mother   . Hypertension Father     Social History:  reports that she has never smoked. She does not have any smokeless tobacco history on file. She reports that she does not drink alcohol or use illicit drugs.  Review of Systems: negative except as above   Blood pressure 98/53, pulse 134, temperature 98.9 F (37.2 C), temperature  source Oral, resp. rate 24, height _0  (1.676 m), weight 51.4 kg (113 lb 5.1 oz), SpO2 99 %. Head: Normocephalic, without obvious abnormality, atraumatic Neck: no adenopathy, no carotid bruit, no JVD, supple, symmetrical, trachea midline and thyroid not enlarged, symmetric, no tenderness/mass/nodules Resp: clear to auscultation bilaterally Cardio: regular rate and rhythm, S1, S2 normal, no murmur, click, rub or gallop GI: Lethargic slow to respond verbally, abdomen soft nondistended with normoactive bowel sounds. No hepatosplenomegaly mass or guarding Extremities: extremities normal, atraumatic, no cyanosis or edema  Results for orders placed  or performed during the hospital encounter of 09/02/14 (from the past 48 hour(s))  Comprehensive metabolic panel     Status: Abnormal   Collection Time: 09/02/14 12:31 PM  Result Value Ref Range   Sodium 126 (L) 135 - 145 mmol/L   Potassium 5.6 (H) 3.5 - 5.1 mmol/L   Chloride 98 (L) 101 - 111 mmol/L   CO2 8 (L) 22 - 32 mmol/L    Comment: REPEATED TO VERIFY CRITICAL RESULT CALLED TO, READ BACK BY AND VERIFIED WITH: HAMILTON RN AT 1333 ON 7.12.16 BY MENDOZA B     Glucose, Bld 57 (L) 65 - 99 mg/dL   BUN 75 (H) 6 - 20 mg/dL   Creatinine, Ser 2.58 (H) 0.44 - 1.00 mg/dL   Calcium 7.9 (L) 8.9 - 10.3 mg/dL   Total Protein 6.1 (L) 6.5 - 8.1 g/dL   Albumin 1.8 (L) 3.5 - 5.0 g/dL   AST 353 (H) 15 - 41 U/L    Comment: RESULTS CONFIRMED BY MANUAL DILUTION CALLED R JOHNSON RN 1850 09/02/14 B MENDOZA CORRECTED ON 07/12 AT 1850: PREVIOUSLY REPORTED AS <5    ALT 139 (H) 14 - 54 U/L    Comment: RESULTS CONFIRMED BY MANUAL DILUTION CALLED R JOHNSON RN 1850 09/02/14 B MENDOZA CORRECTED ON 07/12 AT 1850: PREVIOUSLY REPORTED AS <5    Alkaline Phosphatase 707 (H) 38 - 126 U/L   Total Bilirubin 1.6 (H) 0.3 - 1.2 mg/dL   GFR calc non Af Amer 19 (L) >60 mL/min   GFR calc Af Amer 22 (L) >60 mL/min    Comment: (NOTE) The eGFR has been calculated using the CKD EPI equation. This calculation has not been validated in all clinical situations. eGFR's persistently <60 mL/min signify possible Chronic Kidney Disease. CORRECTED ON 07/12 AT 1850: PREVIOUSLY REPORTED AS 22    Anion gap 20 (H) 5 - 15  CBC with Differential     Status: Abnormal   Collection Time: 09/02/14 12:31 PM  Result Value Ref Range   WBC 21.9 (H) 4.0 - 10.5 K/uL   RBC 3.69 (L) 3.87 - 5.11 MIL/uL   Hemoglobin 10.1 (L) 12.0 - 15.0 g/dL   HCT 30.7 (L) 36.0 - 46.0 %   MCV 83.2 78.0 - 100.0 fL   MCH 27.4 26.0 - 34.0 pg   MCHC 32.9 30.0 - 36.0 g/dL   RDW 17.3 (H) 11.5 - 15.5 %   Platelets 73 (L) 150 - 400 K/uL    Comment: REPEATED TO  VERIFY SPECIMEN CHECKED FOR CLOTS PLATELET COUNT CONFIRMED BY SMEAR    Neutrophils Relative % 86 (H) 43 - 77 %   Lymphocytes Relative 5 (L) 12 - 46 %   Monocytes Relative 9 3 - 12 %   Eosinophils Relative 0 0 - 5 %   Basophils Relative 0 0 - 1 %   Neutro Abs 18.8 (H) 1.7 - 7.7 K/uL   Lymphs Abs 1.1 0.7 -  4.0 K/uL   Monocytes Absolute 2.0 (H) 0.1 - 1.0 K/uL   Eosinophils Absolute 0.0 0.0 - 0.7 K/uL   Basophils Absolute 0.0 0.0 - 0.1 K/uL   RBC Morphology BURR CELLS    WBC Morphology INCREASED BANDS (>20% BANDS)     Comment: MILD LEFT SHIFT (1-5% METAS, OCC MYELO, OCC BANDS)   Smear Review PLATELET COUNT CONFIRMED BY SMEAR     Comment: SCHISTOCYTES NOTED ON SMEAR  Protime-INR     Status: Abnormal   Collection Time: 09/02/14 12:31 PM  Result Value Ref Range   Prothrombin Time 21.9 (H) 11.6 - 15.2 seconds   INR 1.92 (H) 0.00 - 1.49  Ammonia     Status: Abnormal   Collection Time: 09/02/14 12:31 PM  Result Value Ref Range   Ammonia 176 (H) 9 - 35 umol/L  Type and screen     Status: None (Preliminary result)   Collection Time: 09/02/14 12:35 PM  Result Value Ref Range   ABO/RH(D) A POS    Antibody Screen NEG    Sample Expiration 09/05/2014    DAT, IgG NEG    Unit Number [id number]    Blood Component Type RED CELLS,LR    Unit division 00    Status of Unit REL FROM [facility]    Donor AG Type NEGATIVE FOR KIDD A ANTIGEN    Transfusion Status OK TO TRANSFUSE    Crossmatch Result COMPATIBLE    Unit Number [id number]    Blood Component Type RED CELLS,LR    Unit division 00    Status of Unit ISSUED    Transfusion Status OK TO TRANSFUSE    Crossmatch Result COMPATIBLE    Unit Number [id number]    Blood Component Type RED CELLS,LR    Unit division 00    Status of Unit ISSUED    Transfusion Status OK TO TRANSFUSE    Crossmatch Result COMPATIBLE   I-Stat CG4 Lactic Acid, ED     Status: Abnormal   Collection Time: 09/02/14 12:54 PM  Result Value Ref Range   Lactic Acid,  Venous 10.61 (HH) 0.5 - 2.0 mmol/L   Comment NOTIFIED PHYSICIAN   Prepare RBC     Status: None   Collection Time: 09/02/14  1:12 PM  Result Value Ref Range   Order Confirmation ORDER PROCESSED BY BLOOD BANK   Prepare RBC     Status: None   Collection Time: 09/02/14  1:30 PM  Result Value Ref Range   Order Confirmation ORDER PROCESSED BY BLOOD BANK   CBG monitoring, ED     Status: Abnormal   Collection Time: 09/02/14  1:53 PM  Result Value Ref Range   Glucose-Capillary 43 (LL) 65 - 99 mg/dL  I-stat Chem 8, ED     Status: Abnormal   Collection Time: 09/02/14  1:59 PM  Result Value Ref Range   Sodium 125 (L) 135 - 145 mmol/L   Potassium 5.9 (H) 3.5 - 5.1 mmol/L   Chloride 102 101 - 111 mmol/L   BUN 77 (H) 6 - 20 mg/dL   Creatinine, Ser 2.80 (H) 0.44 - 1.00 mg/dL   Glucose, Bld 47 (L) 65 - 99 mg/dL   Calcium, Ion 1.04 (L) 1.13 - 1.30 mmol/L   TCO2 8 0 - 100 mmol/L   Hemoglobin 7.5 (L) 12.0 - 15.0 g/dL   HCT 22.0 (L) 36.0 - 46.0 %  Blood gas, venous     Status: Abnormal   Collection Time: 09/02/14  2:19 PM  Result Value Ref Range   pH, Ven 7.256 7.250 - 7.300   pCO2, Ven 16.8 (L) 45.0 - 50.0 mmHg   pO2, Ven 42.0 30.0 - 45.0 mmHg   Bicarbonate 7.2 (L) 20.0 - 24.0 mEq/L   TCO2 6.6 0 - 100 mmol/L   Acid-base deficit 18.4 (H) 0.0 - 2.0 mmol/L   O2 Saturation 66.0 %   Patient temperature 98.6    Collection site VEIN    Drawn by [id number]    Sample type VENOUS   Urinalysis, Routine w reflex microscopic (not at [facility])     Status: None   Collection Time: 09/02/14  2:23 PM  Result Value Ref Range   Color, Urine YELLOW YELLOW   APPearance CLEAR CLEAR   Specific Gravity, Urine 1.015 1.005 - 1.030   pH 5.0 5.0 - 8.0   Glucose, UA NEGATIVE NEGATIVE mg/dL   Hgb urine dipstick NEGATIVE NEGATIVE   Bilirubin Urine NEGATIVE NEGATIVE   Ketones, ur NEGATIVE NEGATIVE mg/dL   Protein, ur NEGATIVE NEGATIVE mg/dL   Urobilinogen, UA 0.2 0.0 - 1.0 mg/dL   Nitrite NEGATIVE NEGATIVE   Leukocytes,  UA NEGATIVE NEGATIVE    Comment: MICROSCOPIC NOT DONE ON URINES WITH NEGATIVE PROTEIN, BLOOD, LEUKOCYTES, NITRITE, OR GLUCOSE <1000 mg/dL.  Lactic acid, plasma     Status: Abnormal   Collection Time: 09/02/14  4:44 PM  Result Value Ref Range   Lactic Acid, Venous 10.4 (HH) 0.5 - 2.0 mmol/L    Comment: REPEATED TO VERIFY CRITICAL RESULT CALLED TO, READ BACK BY AND VERIFIED WITH: Baldo Daub RN AT 7673 ON 7.12.16 BY MENDOZA B    MRSA PCR Screening     Status: None   Collection Time: 09/02/14  5:01 PM  Result Value Ref Range   MRSA by PCR NEGATIVE NEGATIVE    Comment:        The GeneXpert MRSA Assay (FDA approved for NASAL specimens only), is one component of a comprehensive MRSA colonization surveillance program. It is not intended to diagnose MRSA infection nor to guide or monitor treatment for MRSA infections.   Basic metabolic panel     Status: Abnormal   Collection Time: 09/02/14  9:00 PM  Result Value Ref Range   Sodium 129 (L) 135 - 145 mmol/L   Potassium 4.2 3.5 - 5.1 mmol/L    Comment: DELTA CHECK NOTED REPEATED TO VERIFY    Chloride 95 (L) 101 - 111 mmol/L   CO2 15 (L) 22 - 32 mmol/L   Glucose, Bld 262 (H) 65 - 99 mg/dL   BUN 78 (H) 6 - 20 mg/dL   Creatinine, Ser 2.13 (H) 0.44 - 1.00 mg/dL   Calcium 6.5 (L) 8.9 - 10.3 mg/dL   GFR calc non Af Amer 24 (L) >60 mL/min   GFR calc Af Amer 28 (L) >60 mL/min    Comment: (NOTE) The eGFR has been calculated using the CKD EPI equation. This calculation has not been validated in all clinical situations. eGFR's persistently <60 mL/min signify possible Chronic Kidney Disease.    Anion gap 19 (H) 5 - 15  CBC     Status: Abnormal (Preliminary result)   Collection Time: 09/02/14  9:00 PM  Result Value Ref Range   WBC 15.4 (H) 4.0 - 10.5 K/uL   RBC 2.16 (L) 3.87 - 5.11 MIL/uL   Hemoglobin 5.8 (LL) 12.0 - 15.0 g/dL    Comment: DELTA CHECK NOTED REPEATED TO VERIFY CRITICAL RESULT CALLED TO, READ BACK BY AND VERIFIED  WITH: C  DENNY RN BY A MORRIS MLS _0  7.12.16    HCT 17.3 (L) 36.0 - 46.0 %   MCV 80.1 78.0 - 100.0 fL   MCH 26.9 26.0 - 34.0 pg   MCHC 33.5 30.0 - 36.0 g/dL   RDW 17.0 (H) 11.5 - 15.5 %   Platelets PENDING 150 - 400 K/uL  Procalcitonin     Status: None   Collection Time: 09/02/14  9:00 PM  Result Value Ref Range   Procalcitonin 45.91 ng/mL    Comment:        Interpretation: PCT >= 10 ng/mL: Important systemic inflammatory response, almost exclusively due to severe bacterial sepsis or septic shock. (NOTE)         ICU PCT Algorithm               Non ICU PCT Algorithm    ----------------------------     ------------------------------         PCT < 0.25 ng/mL                 PCT < 0.1 ng/mL     Stopping of antibiotics            Stopping of antibiotics       strongly encouraged.               strongly encouraged.    ----------------------------     ------------------------------       PCT level decrease by               PCT < 0.25 ng/mL       >= 80% from peak PCT       OR PCT 0.25 - 0.5 ng/mL          Stopping of antibiotics                                             encouraged.     Stopping of antibiotics           encouraged.    ----------------------------     ------------------------------       PCT level decrease by              PCT >= 0.25 ng/mL       < 80% from peak PCT        AND PCT >= 0.5 ng/mL             Continuing antibiotics                                              encouraged.       Continuing antibiotics            encouraged.    ----------------------------     ------------------------------     PCT level increase compared          PCT > 0.5 ng/mL         with peak PCT AND          PCT >= 0.5 ng/mL             Escalation of antibiotics  strongly encouraged.      Escalation of antibiotics        strongly encouraged.   Blood gas, arterial     Status: Abnormal   Collection Time: 09/02/14 10:28 PM  Result Value Ref Range    O2 Content 2.0 L/min   Delivery systems NASAL CANNULA    pH, Arterial 7.581 (H) 7.350 - 7.450   pCO2 arterial 16.6 (LL) 35.0 - 45.0 mmHg    Comment: CRITICAL RESULT CALLED TO, READ BACK BY AND VERIFIED WITH: RAHUL DESAI,MD AT 2238 BY VICTOR TAYLOR,RRT,RCP ON 09/02/14    pO2, Arterial 92.8 80.0 - 100.0 mmHg   Bicarbonate 15.6 (L) 20.0 - 24.0 mEq/L   TCO2 14.7 0 - 100 mmol/L   Acid-base deficit 5.6 (H) 0.0 - 2.0 mmol/L   O2 Saturation 98.0 %   Patient temperature 99.0    Collection site RIGHT RADIAL    Drawn by [phone]    Sample type ARTERIAL DRAW    Allens test (pass/fail) PASS PASS  Lactic acid, plasma     Status: Abnormal   Collection Time: 09/02/14 10:30 PM  Result Value Ref Range   Lactic Acid, Venous 9.3 (HH) 0.5 - 2.0 mmol/L    Comment: REPEATED TO VERIFY CRITICAL RESULT CALLED TO, READ BACK BY AND VERIFIED WITH: AUMAN,K/2W _0  ON 09/02/14 BY KARCZEWSKI,S.   Prepare fresh frozen plasma     Status: None (Preliminary result)   Collection Time: 09/02/14 11:00 PM  Result Value Ref Range   Unit Number [id number]    Blood Component Type THAWED PLASMA    Unit division 00    Status of Unit ALLOCATED    Transfusion Status OK TO TRANSFUSE    Unit Number [id number]    Blood Component Type THWPLS APHR1    Unit division 00    Status of Unit ISSUED    Transfusion Status OK TO TRANSFUSE   Troponin I     Status: Abnormal   Collection Time: 09/02/14 11:40 PM  Result Value Ref Range   Troponin I 0.05 (H) <0.031 ng/mL    Comment:        PERSISTENTLY INCREASED TROPONIN VALUES IN THE RANGE OF 0.04-0.49 ng/mL CAN BE SEEN IN:       -UNSTABLE ANGINA       -CONGESTIVE HEART FAILURE       -MYOCARDITIS       -CHEST TRAUMA       -ARRYHTHMIAS       -LATE PRESENTING MYOCARDIAL INFARCTION       -COPD   CLINICAL FOLLOW-UP RECOMMENDED.    Dg Chest Port 1 View  09/02/2014   CLINICAL DATA:  Altered mental status, diarrhea and hematemesis for 2 days. History of liver cancer.  EXAM:  PORTABLE CHEST - 1 VIEW  COMPARISON:  06/07/2014  FINDINGS: The power port is stable. The cardiac silhouette, mediastinal and hilar contours are normal. The lungs are clear. No pleural effusions or pneumothorax. Surgical changes noted in the right upper abdomen.  IMPRESSION: No acute cardiopulmonary findings.   Electronically Signed   By: Marijo Sanes M.D.   On: 09/02/2014 14:49   US Abdomen Limited Ruq  09/02/2014   CLINICAL DATA:  59 year old female with history of liver cancer post liver resection. Prior cholecystectomy. Nausea and vomiting with abdominal pain for 3 days. Subsequent encounter.  EXAM: US ABDOMEN LIMITED - RIGHT UPPER QUADRANT  COMPARISON:  02/07/2014 ultrasound.  FINDINGS: Gallbladder:  Post cholecystectomy.  Common bile duct:  Diameter: 2.3 mm.  Pneumobilia.  Liver:  Heterogeneous appearance of the liver with complex 2.3 x 1.9 x 1.8 cm cystic lesion within the right lobe of the liver. Etiology indeterminate. Question abscess or mass or focal bile collection. Pneumobilia.  Small amount of ascites.  IMPRESSION: Post partial resection of the liver. Complex cystic lesion within the residual liver measures up to 2.3 cm. Question abscess versus mass or possibly focal bile collection.  Pneumobilia.  Small amount of ascites.   Electronically Signed   By: Genia Del M.D.   On: 09/02/2014 17:01    Assessment: Upper GI bleeding with history of cholangiocarcinoma reported signs of cirrhosis, concern for variceal bleeding Plan:  Continue blood transfusion.  Proceed with EGD tonight Octreotide drip Protonix. Vitamin K and monitor platelet count for possible need for transfusion. Antibiotics Treatment for encephalopathy.  Courtnei Ruddell C 09/03/2014, 1:05 AM  Pager [phone] If no answer or after 5 PM call [phone]

## 2014-09-03 NOTE — Procedures (Signed)
Intubation Procedure Note Joann Castaneda [id number] 1955-09-03  Procedure: Intubation Indications: Airway protection and maintenance  Procedure Details Consent: Risks of procedure as well as the alternatives and risks of each were explained to the (patient/caregiver).  Consent for procedure obtained. Time Out: Verified patient identification, verified procedure, site/side was marked, verified correct patient position, special equipment/implants available, medications/allergies/relevent history reviewed, required imaging and test results available.  Performed  MAC and 3Glidescope utilized   Evaluation Hemodynamic Status: BP stable throughout; O2 sats: stable throughout Patient's Current Condition: stable Complications: No apparent complications Patient did tolerate procedure well. Chest X-ray ordered to verify placement.  CXR: tube position acceptable.  Joann Mires, MD [facility] Pulmonary/Critical Care 09/03/2014, 11:46 AM Pager:  [phone] After 3pm call: [phone]

## 2014-09-03 NOTE — Progress Notes (Signed)
[facility] to renally adjust antibiotics (Imipenem) Indication: GNR bacteremia  Allergies  Allergen Reactions  . Rocephin [Ceftriaxone] Other (See Comments)    Dress syndrome  . Vancomycin Other (See Comments)    Dress syndrome.   Marland Kitchen Zosyn [Piperacillin Sod-Tazobactam So]     Dress syndrome  . Amoxicillin Rash  . Sulfa Antibiotics Rash    Patient Measurements: Height: 5\' 6"  (167.6 cm) Weight: 123 lb 10.9 oz (56.1 kg) IBW/kg (Calculated) : 59.3 Adjusted Body Weight:   Vital Signs: Temp: 96.1 F (35.6 C) (07/13 0900) Temp Source: Axillary (07/13 0430) BP: 143/73 mmHg (07/13 0916) Pulse Rate: 85 (07/13 0916) Intake/Output from previous day: 07/12 0701 - 07/13 0700 In: 4128.5 [I.V.:2085.1; Blood:1193.3; IV Piggyback:850] Out: 660 [Urine:660] Intake/Output from this shift: Total I/O In: 393.6 [I.V.:293.6; IV Piggyback:100] Out: 125 [Urine:125]  Labs:  Recent Labs  09/02/14 1231 09/02/14 1359 09/02/14 2100 09/03/14 0345  WBC 21.9*  --  15.4* 19.9*  HGB 10.1* 7.5* 5.8* 7.4*  PLT 73*  --  73* 51*  CREATININE 2.58* 2.80* 2.13* 2.04*   Estimated Creatinine Clearance: 25.6 mL/min (by C-G formula based on Cr of 2.04). No results for input(s): VANCOTROUGH, VANCOPEAK, VANCORANDOM, GENTTROUGH, GENTPEAK, GENTRANDOM, TOBRATROUGH, TOBRAPEAK, TOBRARND, AMIKACINPEAK, AMIKACINTROU, AMIKACIN in the last 72 hours.   Microbiology: Recent Results (from the past 720 hour(s))  Culture, blood (routine x 2)     Status: None (Preliminary result)   Collection Time: 09/02/14  1:51 PM  Result Value Ref Range Status   Specimen Description BLOOD LEFT HAND  Final   Special Requests BOTTLES DRAWN AEROBIC AND ANAEROBIC 2ML  Final   Culture  Setup Time   Final    GRAM NEGATIVE RODS IN BOTH AEROBIC AND ANAEROBIC BOTTLES CRITICAL RESULT CALLED TO, READ BACK BY AND VERIFIED WITH: A [facility] 09/03/14 AT 0358 RHOLMES CONFIRMED BY M CAMPBELL Performed  at [facility]    Culture PENDING  Incomplete   Report Status PENDING  Incomplete  Culture, blood (routine x 2)     Status: None (Preliminary result)   Collection Time: 09/02/14  2:10 PM  Result Value Ref Range Status   Specimen Description BLOOD RIGHT ANTECUBITAL  Final   Special Requests BOTTLES DRAWN AEROBIC AND ANAEROBIC 5CC  Final   Culture  Setup Time   Final    GRAM NEGATIVE RODS IN BOTH AEROBIC AND ANAEROBIC BOTTLES CRITICAL RESULT CALLED TO, READ BACK BY AND VERIFIED WITH: A [facility] 09/03/14 AT [place] BY M Carencro Performed at [facility]    Culture PENDING  Incomplete   Report Status PENDING  Incomplete  MRSA PCR Screening     Status: None   Collection Time: 09/02/14  5:01 PM  Result Value Ref Range Status   MRSA by PCR NEGATIVE NEGATIVE Final    Comment:        The GeneXpert MRSA Assay (FDA approved for NASAL specimens only), is one component of a comprehensive MRSA colonization surveillance program. It is not intended to diagnose MRSA infection nor to guide or monitor treatment for MRSA infections.     Medical History: Past Medical History  Diagnosis Date  . Anxiety   . Elevated liver enzymes 03-26-13  . Jaundice 03-26-13    Jaundice -presently skin and sclera  . DRESS syndrome   . Hepatic encephalopathy   . Cancer     Liver  . GI bleeding    Assessment: 54 yoF s/p resection  of cholangioarcinoma in March 2016; PMH portal hypertension and ascites s/p radiation and chemo.  Admitted 7/12 with worsening encephalopathy despite lactulose and Xifaxan.  Had 2 episodes of hematemesis upon admission with hypotension and dropping Hgb.  Chronic Cipro PTA.  He was started on linezolid and imipenem for possible intra-abdominal infection +/- UTI.  Several antibiotics to be avoided d/t h/o DRESS.    7/12 >> Linezolid 7/12 >> Primaxin  7/12 blood: GNR 7/12 urine:   Renal: Scr has improved since admission.   WBC remains elevated  Dose  changes/levels:  Goal of Therapy:  Dose per patient-specific parameters  Plan:  D1 imipenem/linezolid  Based on current est CrCl, change to imipenem 250mg  IV q8h   Continue to follow renal function and await final culture results  Watch platelet count with linezolid  Doreene Eland, PharmD, BCPS.   Pager: [phone]  09/03/2014,10:32 AM

## 2014-09-03 NOTE — Progress Notes (Addendum)
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 She was  admitted to [facility] 7/12 after progressive worsening of encephalopathy despite continued lactulose and rifaximin. No sig stools for several days, then from 7/10 to 7/12 had been stooling constantly. Her weakness and fatigue continued to worsen, then had episode of hematemesis 7/12 which further prompted admission. One more recurrence of hematemesis on arrival to ED. On presentation she was hypotensive (in 90s) w/ LA of 10. Her initial Hgb was 10 but dropped to 7.5 in the ER. Admitted to the CCM service w/ working dx of possible dehydration, UGIB and possible sepsis.   STUDIES:  7/12 RUQ HA:LPFX partial resection, complex cystic lesion with questionable abscess vs mass. Post partial resection of the liver. Complex cystic lesion within  the residual liver measures up to 2.3 cm. Question abscess versus mass or possibly focal bile collection. Pneumobilia. Small amt ascites  7/13 CT abd/pelvis>>> 7/13: Upper Endoscopy: small gastric ulcer with some stigmata of hemorrhage, no active bleeding no significant blood in the stomach. Small nonbleeding esophageal varices. Antral gastritis and duodenitis possibly from previous radiation  SIGNIFICANT EVENTS: 7/12: Admitted with GI bleed/hematemesis, worsening encephalopathy 7/13 Events overnight: hgb dropped: got 2 units PRBC. Scoped: found non-bleeding esophageal varices small gastric ulcer w/ stigmata of hemorrhage but not actively bleeding, and antral gastritis. PPI gtt and octereotide gtt. BC + GNR. [place] abd w/ mass vs bile collection vs abscess.  Pressors started. On AM rounds: new RML infiltrate, minimally responsive. Scr improved. Lactic acid cleared from 10.4 to 6.2. Husband asking about transfer to [facility]. Team felt emergent issues of infection need to be urgently addressed before that could be considered.    VITAL SIGNS: Temp:  [96.1 F (35.6 C)-100.3 F (37.9 C)] 96.1 F (35.6 C) (07/13 0900) Pulse Rate:  [75-134] 85 (07/13 0916) Resp:  [16-34] 18 (07/13 0916) BP: (87-143)/(36-76) 143/73 mmHg (07/13 0916) SpO2:  [91 %-100 %] 100 % (07/13 0916) FiO2 (%):  [50 %] 50 % (07/13 0916) Weight:  [51.4 kg (113 lb 5.1 oz)-56.1 kg (123 lb 10.9 oz)] 56.1 kg (123 lb 10.9 oz) (07/13 0500) HEMODYNAMICS:   VENTILATOR SETTINGS: none Vent Mode:  [-] PRVC FiO2 (%):  [50 %] 50 % Set Rate:  [18 bmp] 18 bmp Vt Set:  [470 mL] 470 mL PEEP:  [5 cmH20] 5 cmH20 Plateau Pressure:  [15 cmH20] 15 cmH20 INTAKE / OUTPUT:  Intake/Output Summary (Last 24 hours) at 09/03/14 1019 Last data filed at 09/03/14 0919  Gross per 24 hour  Intake 4522.01 ml  Output    785 ml  Net 3737.01 ml    PHYSICAL EXAMINATION: General:  Chronically ill, jaundiced, cachexic female lying in bed Neuro:Opens eyes to voice will not follow commands. Withdraws to pain. Much less awake HEENT: 85mm Sluggish Cardiovascular:  RRR, no M/R/G Lungs:  Rales left base Abdomen:  Scarring on abd, rounded with RUQ tenderness and palpable liver border Musculoskeletal:  Diffuse muscle wasting Skin:  Jaundiced without spider angiomas  LABS:  CBC  Recent Labs Lab 09/02/14 1231 09/02/14 1359 09/02/14 2100 09/03/14 0345  WBC 21.9*  --  15.4* 19.9*  HGB 10.1* 7.5* 5.8* 7.4*  HCT 30.7* 22.0* 17.3* 21.3*  PLT 73*  --  73* 51*   Coag's  Recent Labs Lab 09/02/14 1231  INR 1.92*   BMET  Recent Labs Lab 09/02/14 1231 09/02/14 1359 09/02/14 2100 09/03/14 0345  NA 126* 125* 129* 133*  K 5.6* 5.9* 4.2 3.7  CL 98* 102 95* 97*  CO2 8*  --  15* 22  BUN 75* 77* 78* 82*   CREATININE 2.58* 2.80* 2.13* 2.04*  GLUCOSE 57* 47* 262* 139*   Electrolytes  Recent Labs Lab 09/02/14 1231 09/02/14 2100 09/03/14 0345 09/03/14 0645  CALCIUM 7.9* 6.5* 6.6*  --   MG  --   --   --  1.5*  PHOS  --   --   --  2.7   Sepsis Markers  Recent Labs Lab 09/02/14 1644 09/02/14 2100 09/02/14 2230 09/03/14 0455  LATICACIDVEN 10.4*  --  9.3* 6.2*  PROCALCITON  --  45.91  --   --    ABG  Recent Labs Lab 09/02/14 2228  PHART 7.581*  PCO2ART 16.6*  PO2ART 92.8   Liver Enzymes  Recent Labs Lab 09/02/14 1231  AST 353*  ALT 139*  ALKPHOS 707*  BILITOT 1.6*  ALBUMIN 1.8*   Cardiac Enzymes  Recent Labs Lab 09/02/14 2340  TROPONINI 0.05*   Glucose  Recent Labs Lab 09/02/14 1353 09/03/14 0839  GLUCAP 43* 153*    Imaging Dg Abd 1 View  09/03/2014   CLINICAL DATA:  NG tube placement.  Hematemesis tonight.  EXAM: ABDOMEN - 1 VIEW  COMPARISON:  None.  FINDINGS: Enteric tube tip is in the left upper quadrant consistent with location in the upper stomach. Surgical clips in the right upper quadrant. No small or large bowel dilatation.  IMPRESSION: Enteric tube tip is in the left upper quadrant consistent with location in the upper stomach.   Electronically Signed   By: Lucienne Capers M.D.   On: 09/03/2014 01:41   Dg Chest Port 1 View  09/03/2014   CLINICAL DATA:  Tachypnea  EXAM: PORTABLE CHEST - 1 VIEW  COMPARISON:  09/02/2014  FINDINGS: Power injectable right Port-A-Cath tip:  Lower SVC.  The patient is rotated to the left on today's radiograph, reducing diagnostic sensitivity and specificity. New bilateral abnormal interstitial accentuation noted with airway thickening and right infrahilar airspace opacity. Compensating for the leftward rotation, heart size is within normal limits. No pleural effusion observed.  IMPRESSION: 1. New right infrahilar airspace opacity, possibly pneumonia or aspiration pneumonitis. 2. Bilateral new interstitial accentuation,  potentially from noncardiogenic edema or atypical pneumonia.   Electronically Signed   By: Van Clines M.D.   On: 09/03/2014 07:03   Dg Chest Port 1 View  09/02/2014   CLINICAL DATA:  Altered mental status, diarrhea and hematemesis for 2 days. History of liver cancer.  EXAM: PORTABLE CHEST - 1 VIEW  COMPARISON:  06/07/2014  FINDINGS: The power port is stable. The cardiac silhouette, mediastinal and hilar contours are normal. The lungs are clear. No pleural effusions or pneumothorax. Surgical changes noted in the right upper abdomen.  IMPRESSION: No acute cardiopulmonary findings.   Electronically Signed   By: Marijo Sanes M.D.   On: 09/02/2014 14:49   US Abdomen Limited Ruq  09/02/2014   CLINICAL DATA:  59 year old female with history of liver cancer post liver resection. Prior cholecystectomy. Nausea and vomiting with abdominal pain for 3 days. Subsequent encounter.  EXAM: US ABDOMEN LIMITED - RIGHT UPPER QUADRANT  COMPARISON:  02/07/2014  ultrasound.  FINDINGS: Gallbladder:  Post cholecystectomy.  Common bile duct:  Diameter: 2.3 mm.  Pneumobilia.  Liver:  Heterogeneous appearance of the liver with complex 2.3 x 1.9 x 1.8 cm cystic lesion within the right lobe of the liver. Etiology indeterminate. Question abscess or mass or focal bile collection. Pneumobilia.  Small amount of ascites.  IMPRESSION: Post partial resection of the liver. Complex cystic lesion within the residual liver measures up to 2.3 cm. Question abscess versus mass or possibly focal bile collection.  Pneumobilia.  Small amount of ascites.   Electronically Signed   By: Genia Del M.D.   On: 09/02/2014 17:01     ASSESSMENT / PLAN:  PULMONARY ETT 7/13 >> A: Ineffective airway clearance  Tachypnea --> in setting of metabolic acidosis. New RML infiltrate c/w aspiration  P:   Elective intubation for airway protection  Full vent support PAD protocol (but avoid benzos) CXR in AM  CARDIOVASCULAR Port >> Lt IJ CVL 7/13  >> A:  Shock Both Hemorrhagic and sepsis. Suspect as of 7/13 sepsis driving this process Tachycardia - compensatory due to above. P:  Levophed PRN to maintain goal MAP > 65. Follow up troponin Cortisol in process Start empiric solucortef now   RENAL A:   AKI. Lactic Acidosis/ AG Acidosis with + non-anion gap comp: likely from bicarb loss from freq stools. -lactate starting to clear- AG resolved.  Hyperkalemia - resolved with resus and Bicarb gtt Hyponatremia-resolving hypomagnesemia  P:   Trend lactate Serial chemistries. Strict I&O's. Renal dose meds. Replace Mg  GASTROINTESTINAL A:   Portal hypertension - s/p resection of cholangiocarcinoma (05/06/14) Varices- Grade I nonbleeding per endoscopy RUQ pain. Hematemesis. Hyperbilirubinemia. Elevated alk phos. Hepatic cystic lesion/ right lobe: unclear if this is mass, abscess or bile collection  P:   CT for lesion evaluation Start TF once H&H stable and OK w/ GI  Cont octreotide, protonix.  GI following appreciate their assistance Will d/w GI re: lactulose and rifaximin, have ordered lactulose enema's but if OK to place NGT now will resume via gastric route   HEMATOLOGIC A:   Acute blood loss anemia - unclear source of bleeding at this time. Thrombocytopenia. Coagulopathy. P:  maintain Hgb > 7. Monitor INR. Additional 2u PRBC's on hold-pt has antibodies and difficult to obtain PRBC for transfusion H/H q6hrs.  INFECTIOUS A:   R/o liver abscess vs Cholangitis vs SBP vs UTI.  Possible aspiration- RML infiltrate on radiograph P:   PCT: 7/12 45.91  BCx2 7/12 >>> GNR 2/2 not speciated  UC  7/12>>> Abx:  Cipro 7/12>>>7/12 one dose (chronic cipro use) Linezolid 7/12>>>7/13 Imepenem 7/12>>>   ENDOCRINE A:   Hypoglycemia > resolved. P:   Stress dose steroids for now, d/c if cort > 20  cbg q 4 to ensure no further hypoglycemia   NEUROLOGIC A:   Hepatic encephalopathy-worsening- GCS 9 E3-V2-M4 P:   RASS  goal: 0 Continue rifaximin and lactulose therapy.   FAMILY  - Updates:  Husband at bedside.  Husband states that pt has had her prior care at [facility] and that he has spoken with them.  [facility] is willing to accept pt once she is stabilized.  I have informed husband that we will re-assess this later only reason to go to [facility] would be for cancer issues. Infection at this point driving care. Seems to have 2 primary issues: UGIB AND GNR bacteremia. Suspect that this is from the findings on abd [place]. Not sure if this is abscess  or bile collection in which case bacterial translocation likely source. IF CT abd not able to better explain might need to consider paracentesis to r/o SBP.   - Inter-disciplinary family meet or Palliative Care meeting due by:  7/19  Erick Colace ACNP-BC [place] Pager # [phone] OR # [phone] if no answer   09/03/2014, 10:19 AM  Reviewed above, examined.  She has GNR bacteremia, worsening mental status, increased WOB, infiltrate RLL, and possible abdominal fluid collection on abd u/s.  D/w pt's husband >> proceeded with intubation, CVL placement.  Will continue Abx for aspiration/HCAP and for possible abdominal abscess.  Continue IV fluids and pressors to keep MAP > 65.  F/u CT abd/pelvis >> if fluid collection present, then needs surgery to assess and likely IR to place drain.  CC time by me independent of APP time is 45 minutes.  [name], MD [facility] Pulmonary/Critical Care 09/03/2014, 11:45 AM Pager:  [phone] After 3pm call: [phone]

## 2014-09-03 NOTE — Progress Notes (Signed)
[facility] Progress Note Patient Name: Joann Castaneda DOB: Nov 03, 1955 MRN: [id number]   Date of Service  09/03/2014  HPI/Events of Note    eICU Interventions  Asked [facility] to assess pt for UGIB given Hb drop     Intervention Category Intermediate Interventions: Communication with other healthcare providers and/or family  Rosaura Bolon V. 09/03/2014, 12:37 AM

## 2014-09-03 NOTE — Progress Notes (Signed)
Joann Castaneda 3:55 PM  Subjective: Patient now intubated and her case discussed with my partner Dr. Amedeo Plenty as well as the ICU nurse and there is no signs of further bleeding but her CT was noted  Objective: Vital signs stable afebrile intubated exam pertinent for her abdomen being soft minimal ascites rare bowel sounds labs reviewed BUN and creatinine are worrisome as is CT  Assessment: Multiple medical problems  Plan: Await IR decision regarding drainage agree with lactulose octreotide and Protonix and will follow with you and please let us know if we can help in any way and if question about recurrent bleeding could lavage via  NG tube  [facility] E  Pager [phone] After 5PM or if no answer call [phone]

## 2014-09-04 ENCOUNTER — Inpatient Hospital Stay (HOSPITAL_COMMUNITY): Payer: BLUE CROSS/BLUE SHIELD

## 2014-09-04 ENCOUNTER — Encounter (HOSPITAL_COMMUNITY): Payer: Self-pay | Admitting: Gastroenterology

## 2014-09-04 LAB — PROTIME-INR
INR: 1.57 — AB (ref 0.00–1.49)
Prothrombin Time: 18.8 seconds — ABNORMAL HIGH (ref 11.6–15.2)

## 2014-09-04 LAB — CBC
HCT: 22.5 % — ABNORMAL LOW (ref 36.0–46.0)
HEMOGLOBIN: 7.9 g/dL — AB (ref 12.0–15.0)
MCH: 28.3 pg (ref 26.0–34.0)
MCHC: 35.1 g/dL (ref 30.0–36.0)
MCV: 80.6 fL (ref 78.0–100.0)
PLATELETS: 41 10*3/uL — AB (ref 150–400)
RBC: 2.79 MIL/uL — ABNORMAL LOW (ref 3.87–5.11)
RDW: 16.1 % — AB (ref 11.5–15.5)
WBC: 23.7 10*3/uL — ABNORMAL HIGH (ref 4.0–10.5)

## 2014-09-04 LAB — GLUCOSE, CAPILLARY
Glucose-Capillary: 201 mg/dL — ABNORMAL HIGH (ref 65–99)
Glucose-Capillary: 151 mg/dL — ABNORMAL HIGH (ref 65–99)
Glucose-Capillary: 159 mg/dL — ABNORMAL HIGH (ref 65–99)
Glucose-Capillary: 148 mg/dL — ABNORMAL HIGH (ref 65–99)
Glucose-Capillary: 158 mg/dL — ABNORMAL HIGH (ref 65–99)
Glucose-Capillary: 188 mg/dL — ABNORMAL HIGH (ref 65–99)

## 2014-09-04 LAB — PREPARE FRESH FROZEN PLASMA
UNIT DIVISION: 0
UNIT DIVISION: 0

## 2014-09-04 LAB — LACTIC ACID, PLASMA: Lactic Acid, Venous: 5 mmol/L (ref 0.5–2.0)

## 2014-09-04 LAB — BASIC METABOLIC PANEL
ANION GAP: 10 (ref 5–15)
BUN: 81 mg/dL — AB (ref 6–20)
CALCIUM: 7.4 mg/dL — AB (ref 8.9–10.3)
CO2: 19 mmol/L — AB (ref 22–32)
CREATININE: 1.77 mg/dL — AB (ref 0.44–1.00)
Chloride: 104 mmol/L (ref 101–111)
GFR calc non Af Amer: 30 mL/min — ABNORMAL LOW (ref >60)
GFR, EST AFRICAN AMERICAN: 35 mL/min — AB (ref >60)
GLUCOSE: 177 mg/dL — AB (ref 65–99)
Potassium: 3.7 mmol/L (ref 3.5–5.1)
SODIUM: 133 mmol/L — AB (ref 135–145)

## 2014-09-04 LAB — AMMONIA: Ammonia: 85 umol/L — ABNORMAL HIGH (ref 9–35)

## 2014-09-04 LAB — PHOSPHORUS: Phosphorus: 4.6 mg/dL (ref 2.5–4.6)

## 2014-09-04 LAB — MAGNESIUM: MAGNESIUM: 3.1 mg/dL — AB (ref 1.7–2.4)

## 2014-09-04 MED ORDER — SUCRALFATE 1 GM/10ML PO SUSP
1.0000 g | Freq: Three times a day (TID) | ORAL | Status: DC
  Administered 2014-09-04 – 2014-09-11 (×27): 1 g via ORAL
  Filled 2014-09-04 (×28): qty 10

## 2014-09-04 MED ORDER — RIFAXIMIN 550 MG PO TABS
550.0000 mg | ORAL_TABLET | Freq: Two times a day (BID) | ORAL | Status: DC
  Administered 2014-09-04 – 2014-09-11 (×13): 550 mg via ORAL
  Filled 2014-09-04 (×16): qty 1

## 2014-09-04 MED ORDER — CETYLPYRIDINIUM CHLORIDE 0.05 % MT LIQD
7.0000 mL | Freq: Four times a day (QID) | OROMUCOSAL | Status: DC
  Administered 2014-09-04 (×2): 7 mL via OROMUCOSAL

## 2014-09-04 MED ORDER — LACTULOSE 10 GM/15ML PO SOLN
30.0000 g | Freq: Two times a day (BID) | ORAL | Status: DC
  Administered 2014-09-04 – 2014-09-11 (×13): 30 g via ORAL
  Filled 2014-09-04 (×3): qty 45
  Filled 2014-09-04: qty 60
  Filled 2014-09-04: qty 45
  Filled 2014-09-04 (×4): qty 60
  Filled 2014-09-04 (×3): qty 45
  Filled 2014-09-04: qty 60
  Filled 2014-09-04: qty 45

## 2014-09-04 MED ORDER — CHLORHEXIDINE GLUCONATE 0.12 % MT SOLN
15.0000 mL | Freq: Two times a day (BID) | OROMUCOSAL | Status: DC
  Administered 2014-09-04 – 2014-09-08 (×8): 15 mL via OROMUCOSAL
  Filled 2014-09-04 (×8): qty 15

## 2014-09-04 NOTE — Progress Notes (Addendum)
Ok Edwards 9:55 AM  Subjective: Head long conversation with patient's husband regarding her bleeding earlier in the year which according to him required hyperbaric treatment and he did not know the name of the gastroenterologist she saw them at [facility] but I will plan to call her oncologist to try to learn more but she's not had any further bleeding and is slowly waking up  Objective: Vital signs stable afebrile no acute distress abdomen is soft minimal ascites no obvious tender hemoglobin stable  Assessment: Multiple medical problems  Plan: Will be on standby to help when necessary and will check on tomorrow and will talk to her oncologist to try to learn more about her bleeding hopefully in the meantime and as an addendum I discussed her case with Dr. Lynnette Caffey and he said she was bleeding from radiation duodenitis and that's with the hyperbaric oxygen was for and did seem to slow down her transfusion requirements and he also said that they are happy to take her back in transfer if needed or requested by the family [facility]  Pager [phone] After 5PM or if no answer call [phone]

## 2014-09-04 NOTE — Progress Notes (Signed)
PULMONARY / CRITICAL CARE MEDICINE   Name: Joann Castaneda MRN: [id number] DOB: 26-Jan-1956    ADMISSION DATE:  09/02/2014 CONSULTATION DATE:  09/02/14  REFERRING MD :  ED physician  CHIEF COMPLAINT:  Hematemesis/hypotension  INITIAL PRESENTATION: 59 yo female presented with encephalopathy, frequent stools, weakenss/fatigue.  She has hx of cholangiocarcinoma with portal HTN and ascites s/p resection March 2016, and s/p chemo-radiation at [facility].  Developed hematemesis and transferred to ICU, shock.  STUDIES:  7/12 Rt upper quadrant u/s >> complex cysts in liver 7/13 CT abd/pelvis>>> b/l effusions with ATX, 3 cystic lesions in Rt liver ?abscess, mild splenomegaly, moderate ascites, diffuse gastric/SB/colon wall thickening 7/13 EGD >> small gastric ulcer, no active bleeding, non bleeding esophageal varices, antral gastritis and duodenitis likely from radiation  SIGNIFICANT EVENTS: 7/12 Admitted with GI bleed/hematemesis, worsening encephalopathy 7/13 transfuse 2 units PRBC, EGD, pressors, VDRF 7/14 Off pressors, extubated  VITAL SIGNS: Temp:  [95.4 F (35.2 C)-98.2 F (36.8 C)] 97.9 F (36.6 C) (07/14 0700) Pulse Rate:  [82-96] 94 (07/14 0306) Resp:  [14-26] 18 (07/14 0700) BP: (90-143)/(52-73) 99/61 mmHg (07/14 0700) SpO2:  [91 %-100 %] 91 % (07/14 0700) FiO2 (%):  [30 %-50 %] 30 % (07/14 0741) Weight:  [123 lb 14.4 oz (56.2 kg)] 123 lb 14.4 oz (56.2 kg) (07/14 0500) VENTILATOR SETTINGS:  Vent Mode:  [-] CPAP;PSV FiO2 (%):  [30 %-50 %] 30 % Set Rate:  [14 bmp-18 bmp] 14 bmp Vt Set:  [350 mL-470 mL] 470 mL PEEP:  [5 cmH20] 5 cmH20 Pressure Support:  [5 cmH20] 5 cmH20 Plateau Pressure:  [13 cmH20-15 cmH20] 15 cmH20 INTAKE / OUTPUT:  Intake/Output Summary (Last 24 hours) at 09/04/14 0751 Last data filed at 09/04/14 0700  Gross per 24 hour  Intake 4071.68 ml  Output    980 ml  Net 3091.68 ml    PHYSICAL EXAMINATION: General:  Chronically ill, jaundiced, cachexic female  lying in bed, on vent , off sedation Neuro:MAE x4 spontaneously , no follow commands HEENT: No JVD/LAN, PERL 3 mm Cardiovascular:  RRR, no M/R/G Lungs:  Coarse rhonchi bilateral  Abdomen:  Scarring on abd, rounded with RUQ tenderness and palpable liver border Musculoskeletal:  Diffuse muscle wasting Skin:  Jaundiced without spider angiomas, multiple areas of ecchymosis   LABS:  CBC  Recent Labs Lab 09/02/14 2100 09/03/14 0345 09/03/14 1110 09/04/14 0150  WBC 15.4* 19.9*  --  23.7*  HGB 5.8* 7.4* 7.8* 7.9*  HCT 17.3* 21.3* 22.4* 22.5*  PLT 73* 51*  --  41*   Coag's  Recent Labs Lab 09/02/14 1231 09/03/14 1110 09/04/14 0150  INR 1.92* 1.58* 1.57*   BMET  Recent Labs Lab 09/03/14 1110 09/03/14 1800 09/04/14 0150  NA 133* 131* 133*  K 3.4* 3.9 3.7  CL 100* 101 104  CO2 22 20* 19*  BUN 82* 78* 81*  CREATININE 1.95* 1.90* 1.77*  GLUCOSE 144* 144* 177*   Electrolytes  Recent Labs Lab 09/03/14 0645 09/03/14 1110 09/03/14 1800 09/04/14 0150  CALCIUM  --  7.0* 7.2* 7.4*  MG 1.5*  --   --  3.1*  PHOS 2.7  --   --  4.6   Sepsis Markers  Recent Labs Lab 09/02/14 2100 09/02/14 2230 09/03/14 0455 09/03/14 1110  LATICACIDVEN  --  9.3* 6.2* 5.9*  PROCALCITON 45.91  --   --   --    ABG  Recent Labs Lab 09/02/14 2228 09/03/14 1112  PHART 7.581* 7.474*  PCO2ART 16.6* 28.0*  PO2ART 92.8 171*   Liver Enzymes  Recent Labs Lab 09/02/14 1231  AST 353*  ALT 139*  ALKPHOS 707*  BILITOT 1.6*  ALBUMIN 1.8*   Cardiac Enzymes  Recent Labs Lab 09/02/14 2340  TROPONINI 0.05*   Glucose  Recent Labs Lab 09/02/14 1353 09/03/14 0839 09/03/14 1515 09/03/14 1928  GLUCAP 43* 153* 134* 161*    Imaging Ct Abdomen Pelvis Wo Contrast  09/03/2014   CLINICAL DATA:  Right upper quadrant pain for several days. Diarrhea. Hematemesis. Surgical resection of cholangiocarcinoma approximately 4 months ago.  EXAM: CT ABDOMEN AND PELVIS WITHOUT CONTRAST   TECHNIQUE: Multidetector CT imaging of the abdomen and pelvis was performed following the standard protocol without IV contrast.  COMPARISON:  Ultrasound on 09/02/2014 and MRI on 03/23/2013  FINDINGS: Lower chest: Small bilateral pleural effusions and bibasilar atelectasis noted. Small hiatal hernia also demonstrated.  Hepatobiliary: Surgical clips are seen from previous left hepatectomy. 2 cystic lesions containing air-fluid levels are seen in the dome of the right hepatic lobe which measure approximately 3.5 and 5.0 cm. A third low-attenuation lesion containing small amount of internal areas seen in the inferior right hepatic lobe which measures 2.4 cm. These are suspicious for hepatic abscesses. Mild pneumobilia also noted.  Pancreas: No mass or inflammatory process visualized on this unenhanced exam.  Spleen: Mild splenomegaly, with spleen measuring approximately 13 cm in length.  Adrenal Glands:  No masses identified.  Kidneys/Urinary tract: No evidence of urolithiasis or hydronephrosis. Foley catheter seen within the bladder.  Stomach/Bowel/Peritoneum: Moderate ascites is seen within the abdomen and pelvis. Diffuse gastric, small bowel and colonic wall thickening is seen. No evidence of bowel obstruction.  Vascular/Lymphatic: No pathologically enlarged lymph nodes identified. Venous collaterals are seen in the gastrosplenic ligament, consistent with portal venous hypertension.  Reproductive:  No mass or other significant abnormality noted.  Other:  None.  Musculoskeletal:  No suspicious bone lesions identified.  IMPRESSION: Three cystic lesions of the right hepatic lobe measuring up to 5 cm which contain internal gas, and are suspicious for hepatic abscesses.  Diffuse gastric, small bowel, and colonic wall thickening. Differential diagnosis includes hypoalbuminemia and infectious or inflammatory etiologies.  Moderate ascites, small bilateral pleural effusions, and bibasilar atelectasis.  Splenomegaly and  gastrohepatic ligament venous collaterals, consistent with portal venous hypertension.   Electronically Signed   By: Earle Gell M.D.   On: 09/03/2014 14:05   Dg Chest Port 1 View  09/04/2014   CLINICAL DATA:  Intubation.  EXAM: PORTABLE CHEST - 1 VIEW  COMPARISON:  None.  FINDINGS: Endotracheal tube, left IJ line, and NG tube in good anatomic position. Power port catheter in good anatomic position. Mediastinum hilar structures normal. Heart size normal. Partial clearing of right infrahilar/right lower lower lobe infiltrate. No pleural effusion or pneumothorax.  IMPRESSION: 1. Lines and tubes in stable position. 2. Partial clearing of right infrahilar/right lower lobe infiltrate.   Electronically Signed   By: Marcello Moores  Register   On: 09/04/2014 07:08   Dg Chest Port 1 View  09/03/2014   CLINICAL DATA:  Hypoxia.  History of cholangiocarcinoma  EXAM: PORTABLE CHEST - 1 VIEW  COMPARISON:  Study obtained earlier in the day  FINDINGS: Endotracheal tube tip is 4.4 cm above the carina. Left jugular catheter tip is in the superior vena cava. Port-A-Cath tip is in the superior cava. Nasogastric tube tip and side port are below the diaphragm. No pneumothorax. There is airspace opacity inferior to the right hilum in the right lower  lung zone, stable from earlier in the day. There is underlying interstitial edema. Heart size and pulmonary vascularity are within normal limits. No adenopathy.  IMPRESSION: Tube and catheter positions as described without pneumothorax. Stable right lower lobe infiltrate. Concern for aspiration. Underlying interstitial edema. Differential considerations must include noncardiogenic edema, atypical infectious pneumonia, or possibly allergic type reaction. Cardiac silhouette within normal limits.   Electronically Signed   By: Lowella Grip III M.D.   On: 09/03/2014 10:58     ASSESSMENT / PLAN:  PULMONARY ETT 7/13 >> 7/14 A: Ineffective airway clearance . Tachypnea --> in setting of  metabolic acidosis. New RML infiltrate most likely atelectasis. P:   Extubated 7/14 Oxygen to keep SpO2 > 92% F/u CXR Bronchial hygiene  CARDIOVASCULAR Port >> Lt IJ CVL 7/13 >> A:  Septic/hemorrhagic shock >> off pressors 7/14. P:  Goal even fluid balance  RENAL A:   AKI >> improving. Metabolic acidosis >> lactic acidosis, and non gap acidosis. Hyperkalemia >> resolved. Hypomagnesemia >> resolved. P:   F/u lactic acid F/u electrolytes Monitor renal fx  GASTROINTESTINAL A:   Upper GI bleed 2nd to PUD, gastritis/duodenitis. Hx of cholangiocarcinoma with portal HTN, ascites, splenomegaly, esophageal varices. P:   Protonix, octreotide per GI Continue lactulose Advance diet when okay with GI NG tube per GI Hold outpt lasix, corgard, rifaximin, aldactone for now  HEMATOLOGIC A:   Acute blood loss anemia 2nd to Upper GI bleed. Thrombocytopenia, coagulopathy in setting of liver disease. P:  F/u CBC, coags Transfuse for Hb < 7 or bleeding SCD's  INFECTIOUS A:   Septic shock with GNR bacteremia >> likely intra-abdominal source >> Procalcitonin 45.91 from 7/12. Rt lung infiltrate on CXR >> initial concern for aspiration, but most likely atelectasis. P:   Day 3 of primaxin D/c linezolid 7/14  BCx2 7/12 >>> GNR 2/2 >>  Sputum 7/14>>  ENDOCRINE A:   Hypoglycemia > resolved. P:   Monitor CBG's  NEUROLOGIC A:   Acute metabolic/sepsis encephalopathy >> improving. P:   Monitor mental status  Richardson Landry Minor ACNP [facility] Pager [phone] till 3 pm If no answer page [phone] 09/04/2014, 7:51 AM  Reviewed above, examined.  She is off pressors.  Did well with pressure support and extubated.  Mental status improving, and following commands.  No evidence for additional bleeding.  Heart rate regular, chest w/o wheeze, port site clean, abdomen soft, 1+ edema.  Labs, CXR from 7/14 reviewed >> Hb stable, PLT down, creatine improved.  CXR with decreased RLL  infiltrate.  Will narrow Abx, f/u Hb.  Have asked her husband to get CD with CT abdomen from [facility] and bring back here.  Will have this reviewed by radiology >> if cystic lesions in liver are new, then might need to have IR drain.  Husband would like to have her transferred to [facility] >> if she maintains herself off of vent, then likely could arrange for transfer to medical ward bed rather than ICU.  CC time by me independent of APP time is 40 minutes.  [name], MD [facility] Pulmonary/Critical Care 09/04/2014, 10:20 AM Pager:  [phone] After 3pm call: [phone]

## 2014-09-04 NOTE — Procedures (Signed)
Extubation Procedure Note  Patient Details:   Name: Joann Castaneda DOB: Dec 04, 1955 MRN: [id number]   Airway Documentation:     Evaluation  O2 sats: stable throughout Complications: No apparent complications Patient did tolerate procedure well. Bilateral Breath Sounds: Clear Suctioning: Airway Yes  Baldwin [place] Nannette 09/04/2014, 9:42 AM  Placed PT on 2 lpm [place]- RN aware.

## 2014-09-04 NOTE — Progress Notes (Signed)
Ordered sputum sample labeled properly and tubed to lab.

## 2014-09-05 ENCOUNTER — Other Ambulatory Visit: Payer: Self-pay

## 2014-09-05 ENCOUNTER — Inpatient Hospital Stay (HOSPITAL_COMMUNITY): Payer: BLUE CROSS/BLUE SHIELD

## 2014-09-05 DIAGNOSIS — L0291 Cutaneous abscess, unspecified: Secondary | ICD-10-CM

## 2014-09-05 DIAGNOSIS — B961 Klebsiella pneumoniae [K. pneumoniae] as the cause of diseases classified elsewhere: Secondary | ICD-10-CM

## 2014-09-05 LAB — MAGNESIUM: Magnesium: 3 mg/dL — ABNORMAL HIGH (ref 1.7–2.4)

## 2014-09-05 LAB — BASIC METABOLIC PANEL
ANION GAP: 6 (ref 5–15)
BUN: 81 mg/dL — AB (ref 6–20)
CHLORIDE: 108 mmol/L (ref 101–111)
CO2: 21 mmol/L — AB (ref 22–32)
Calcium: 7.5 mg/dL — ABNORMAL LOW (ref 8.9–10.3)
Creatinine, Ser: 1.61 mg/dL — ABNORMAL HIGH (ref 0.44–1.00)
GFR, EST AFRICAN AMERICAN: 39 mL/min — AB (ref >60)
GFR, EST NON AFRICAN AMERICAN: 33 mL/min — AB (ref >60)
Glucose, Bld: 174 mg/dL — ABNORMAL HIGH (ref 65–99)
Potassium: 3.4 mmol/L — ABNORMAL LOW (ref 3.5–5.1)
Sodium: 135 mmol/L (ref 135–145)

## 2014-09-05 LAB — CBC
HEMATOCRIT: 21.9 % — AB (ref 36.0–46.0)
HEMOGLOBIN: 7.5 g/dL — AB (ref 12.0–15.0)
MCH: 27.8 pg (ref 26.0–34.0)
MCHC: 34.2 g/dL (ref 30.0–36.0)
MCV: 81.1 fL (ref 78.0–100.0)
Platelets: 37 10*3/uL — ABNORMAL LOW (ref 150–400)
RBC: 2.7 MIL/uL — ABNORMAL LOW (ref 3.87–5.11)
RDW: 16.3 % — ABNORMAL HIGH (ref 11.5–15.5)
WBC: 13.5 10*3/uL — ABNORMAL HIGH (ref 4.0–10.5)

## 2014-09-05 LAB — HEPATIC FUNCTION PANEL
ALT: 87 U/L — AB (ref 14–54)
AST: 158 U/L — ABNORMAL HIGH (ref 15–41)
Albumin: 1.6 g/dL — ABNORMAL LOW (ref 3.5–5.0)
Alkaline Phosphatase: 586 U/L — ABNORMAL HIGH (ref 38–126)
BILIRUBIN INDIRECT: 1.1 mg/dL — AB (ref 0.3–0.9)
Bilirubin, Direct: 1.2 mg/dL — ABNORMAL HIGH (ref 0.1–0.5)
Total Bilirubin: 2.3 mg/dL — ABNORMAL HIGH (ref 0.3–1.2)
Total Protein: 4.9 g/dL — ABNORMAL LOW (ref 6.5–8.1)

## 2014-09-05 LAB — CULTURE, BLOOD (ROUTINE X 2)

## 2014-09-05 LAB — APTT: APTT: 34 s (ref 24–37)

## 2014-09-05 LAB — GLUCOSE, CAPILLARY
GLUCOSE-CAPILLARY: 157 mg/dL — AB (ref 65–99)
Glucose-Capillary: 157 mg/dL — ABNORMAL HIGH (ref 65–99)
Glucose-Capillary: 169 mg/dL — ABNORMAL HIGH (ref 65–99)
Glucose-Capillary: 135 mg/dL — ABNORMAL HIGH (ref 65–99)
Glucose-Capillary: 144 mg/dL — ABNORMAL HIGH (ref 65–99)
Glucose-Capillary: 137 mg/dL — ABNORMAL HIGH (ref 65–99)

## 2014-09-05 LAB — LACTIC ACID, PLASMA: Lactic Acid, Venous: 2.9 mmol/L (ref 0.5–2.0)

## 2014-09-05 LAB — PHOSPHORUS: Phosphorus: 4.3 mg/dL (ref 2.5–4.6)

## 2014-09-05 LAB — PROTIME-INR
INR: 1.3 (ref 0.00–1.49)
Prothrombin Time: 16.3 seconds — ABNORMAL HIGH (ref 11.6–15.2)

## 2014-09-05 MED ORDER — CETYLPYRIDINIUM CHLORIDE 0.05 % MT LIQD
7.0000 mL | Freq: Two times a day (BID) | OROMUCOSAL | Status: DC
  Administered 2014-09-05 – 2014-09-08 (×6): 7 mL via OROMUCOSAL

## 2014-09-05 MED ORDER — SODIUM CHLORIDE 0.9 % IV SOLN: Freq: Once | INTRAVENOUS | Status: DC

## 2014-09-05 NOTE — Progress Notes (Signed)
PULMONARY / CRITICAL CARE MEDICINE   Name: Joann Castaneda MRN: [id number] DOB: Mar 01, 1955    ADMISSION DATE:  09/02/2014 CONSULTATION DATE:  09/02/14  REFERRING MD :  ED physician  CHIEF COMPLAINT:  Hematemesis/hypotension  INITIAL PRESENTATION: 59 yo female presented with encephalopathy, frequent stools, weakenss/fatigue.  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  Developed hematemesis and transferred to ICU, shock.  STUDIES:  7/12 Rt upper quadrant u/s >> complex cysts in liver 7/13 CT abd/pelvis>>> b/l effusions with ATX, 3 cystic lesions in Rt liver ?abscess, mild splenomegaly, moderate ascites, diffuse gastric/SB/colon wall thickening 7/13 EGD >> small gastric ulcer, no active bleeding, non bleeding esophageal varices, antral gastritis and duodenitis likely from radiation  SIGNIFICANT EVENTS: 7/12 Admitted with GI bleed/hematemesis, worsening encephalopathy 7/13 transfuse 2 units PRBC, EGD, pressors, VDRF 7/14 Off pressors, extubated  SUBJECTIVE: Feels hungry.  Has cough with chest congestion.  Mile RUQ pain >> she says this is chronic.  VITAL SIGNS: Temp:  [94.8 F (34.9 C)-97.7 F (36.5 C)] 97.7 F (36.5 C) (07/15 1000) Resp:  [12-24] 24 (07/15 1000) BP: (88-119)/(41-80) 107/66 mmHg (07/15 1000) SpO2:  [93 %-100 %] 96 % (07/15 1000) INTAKE / OUTPUT:  Intake/Output Summary (Last 24 hours) at 09/05/14 1023 Last data filed at 09/05/14 0700  Gross per 24 hour  Intake   3550 ml  Output    680 ml  Net   2870 ml    PHYSICAL EXAMINATION: General: pleasant Neuro: more alert, follows commands HEENT: no sinus tenderness Cardiovascular: regular Lungs: scattered crackles, no wheeze Abdomen:  Scarring on abd, rounded with mild RUQ tenderness Musculoskeletal:  Diffuse muscle wasting Skin: multiple areas of ecchymosis   LABS:  CBC  Recent Labs Lab 09/03/14 0345 09/03/14 1110  09/04/14 0150 09/05/14 0400  WBC 19.9*  --  23.7* 13.5*  HGB 7.4* 7.8* 7.9* 7.5*  HCT 21.3* 22.4* 22.5* 21.9*  PLT 51*  --  41* 37*   Coag's  Recent Labs Lab 09/03/14 1110 09/04/14 0150 09/05/14 0400  APTT  --   --  34  INR 1.58* 1.57* 1.30   BMET  Recent Labs Lab 09/03/14 1800 09/04/14 0150 09/05/14 0400  NA 131* 133* 135  K 3.9 3.7 3.4*  CL 101 104 108  CO2 20* 19* 21*  BUN 78* 81* 81*  CREATININE 1.90* 1.77* 1.61*  GLUCOSE 144* 177* 174*   Electrolytes  Recent Labs Lab 09/03/14 0645  09/03/14 1800 09/04/14 0150 09/05/14 0400  CALCIUM  --   < > 7.2* 7.4* 7.5*  MG 1.5*  --   --  3.1* 3.0*  PHOS 2.7  --   --  4.6 4.3  < > = values in this interval not displayed.   Sepsis Markers  Recent Labs Lab 09/02/14 2100  09/03/14 1110 09/04/14 0850 09/05/14 0430  LATICACIDVEN  --   < > 5.9* 5.0* 2.9*  PROCALCITON 45.91  --   --   --   --   < > = values in this interval not displayed.   ABG  Recent Labs Lab 09/02/14 2228 09/03/14 1112  PHART 7.581* 7.474*  PCO2ART 16.6* 28.0*  PO2ART 92.8 171*   Liver Enzymes  Recent Labs Lab 09/02/14 1231 09/05/14 0400  AST 353* 158*  ALT 139* 87*  ALKPHOS 707* 586*  BILITOT 1.6* 2.3*  ALBUMIN 1.8* 1.6*   Cardiac Enzymes  Recent Labs Lab 09/02/14 2340  TROPONINI 0.05*  Glucose  Recent Labs Lab 09/04/14 1144 09/04/14 1645 09/04/14 1938 09/04/14 2322 09/05/14 0352 09/05/14 0745  GLUCAP 157* 148* 158* 188* 169* 135*    Imaging Dg Chest Port 1 View  09/05/2014   CLINICAL DATA:  Respiratory failure.  Endotracheal tube removed  EXAM: PORTABLE CHEST - 1 VIEW  COMPARISON:  September 04, 2014  FINDINGS: Endotracheal tube and nasogastric tube have been removed. Left jugular catheter tip is in the superior vena cava. Port-A-Cath tip is in the superior cava near the cavoatrial junction. No pneumothorax. There is stable underlying interstitial edema. The previously and right lower lobe consolidation has  essentially completely resolved. No new opacity. Heart size and pulmonary vascularity are normal. No adenopathy.  IMPRESSION: Stable underlying interstitial edema. No airspace consolidation. Catheter positions as described without pneumothorax.   Electronically Signed   By: Lowella Grip III M.D.   On: 09/05/2014 07:10     ASSESSMENT / PLAN:  PULMONARY ETT 7/13 >> 7/14 A: Atelectasis. P:   Bronchial hygiene Mobilize as tolerated F/u CXR intermittently Oxygen to keep SpO2 > 92%  CARDIOVASCULAR Port >> Lt IJ CVL 7/13 >> 7/15 A:  Septic/hemorrhagic shock >> off pressors 7/14. P:  Goal even fluid balance  RENAL A:   AKI >> improving. Metabolic acidosis >> lactic acidosis, and non gap acidosis >> resolved. Hyperkalemia >> resolved. Hypomagnesemia >> resolved. P:   F/u electrolytes Monitor renal fx  GASTROINTESTINAL A:   Upper GI bleed 2nd to PUD, gastritis/duodenitis. Hx of cholangiocarcinoma with portal HTN, ascites, splenomegaly, esophageal varices. Liver cysts P:   Protonix, octreotide per GI Continue lactulose, rifaximin Advance diet when okay with GI NG tube per GI Hold outpt lasix, corgard, aldactone for now IR to review Abdominal imaging studies from [facility], and then decide if draining of liver cysts is needed  HEMATOLOGIC A:   Acute blood loss anemia 2nd to Upper GI bleed. Thrombocytopenia, coagulopathy in setting of liver disease. P:  F/u CBC, coags Transfuse for Hb < 7 or bleeding SCD's  INFECTIOUS A:   Septic shock with Klebsiella bacteremia >> likely intra-abdominal source >> Procalcitonin 45.91 from 7/12. Rt lung infiltrate on CXR >> initial concern for aspiration, but most likely atelectasis. DRESS syndrome from PCN and cephalosporins. P:   Day 4 of primaxin  BCx2 7/12 >> Klebsiella species  Sputum 7/14>>  ENDOCRINE A:   Hypoglycemia > improved. P:   Monitor CBG's Continue Dextrose in IV fluids until she eats  NEUROLOGIC A:    Acute metabolic/sepsis encephalopathy >> improving. Deconditioning. P:   Monitor mental status PT  Summary: Overall, she is slowly improving.  Main issues is whether she needs drainage of liver cysts.  Husband is okay with continuing care at [facility] and defer transfer to [facility] since she is improving.  D/w IR >> apparently there were multiple studies sent from disk at [facility], and this is taking long time to load into system.  Once IR has reviewed abdominal imaging studies, then they will determine if drainage of liver cysts is needed.  Updated pt's husband at bedside.  [name], MD [facility] Pulmonary/Critical Care 09/05/2014, 10:23 AM Pager:  [phone] After 3pm call: [phone]

## 2014-09-05 NOTE — Evaluation (Signed)
Physical Therapy Evaluation Patient Details Name: Joann Castaneda MRN: [id number] DOB: December 01, 1955 Today's Date: 09/05/2014   History of Present Illness  59 yo female with PMH of resection of cholangiocarcinoma 05/06/14 (further sx details below) w/ h/o portal hypertension and ascites post radiation and chemotherapy. She was admitted to [facility] 7/12 after progressive worsening of encephalopathy despite continued lactulose and rifaximin. No sig stools for several days, then from 7/10 to 7/12 had been stooling constantly. Her weakness and fatigue continued to worsen, then had episode of hematemesis 7/12 which further prompted admission. One more recurrence of hematemesis on arrival to ED. On presentation she was hypotensive (in 90s) w/ LA of 10. Her initial Hgb was 10 but dropped to 7.5 in the ER. Admitted to the CCM service w/ working dx of possible dehydration, UGIB and possible sepsis  Clinical Impression  Patient is  Very weak, did sit at edge of bed. Patient will benefit from PT to address problems listed in note below. recommend  OT consult.    Follow Up Recommendations SNF;Supervision/Assistance - 24 hour, unless improves to functional level for HHPT     Equipment Recommendations  None recommended by PT    Recommendations for Other Services OT consult     Precautions / Restrictions Precautions Precautions: Fall Precaution Comments: profound weakness      Mobility  Bed Mobility Overal bed mobility: Needs Assistance;+2 for physical assistance;+ 2 for safety/equipment Bed Mobility: Rolling;Sidelying to Sit;Sit to Sidelying Rolling: Max assist;+2 for safety/equipment Sidelying to sit: Max assist;HOB elevated     Sit to sidelying: Max assist General bed mobility comments: extensive assist for all mobility, very weak  Transfers                 General transfer comment: unable  Ambulation/Gait                Stairs            Wheelchair Mobility    Modified  Rankin (Stroke Patients Only)       Balance Overall balance assessment: Needs assistance Sitting-balance support: Feet supported;Bilateral upper extremity supported Sitting balance-Leahy Scale: Poor Sitting balance - Comments: gradually gained some control of balance.                                     Pertinent Vitals/Pain Pain Assessment: Faces Faces Pain Scale: Hurts little more Pain Location: tender to touch Pain Descriptors / Indicators: Aching Pain Intervention(s): Limited activity within patient's tolerance    Home Living Family/patient expects to be discharged to:: Private residence Living Arrangements: Spouse/significant other Available Help at Discharge: Family Type of Home: House Home Access: Stairs to enter Entrance Stairs-Rails: Right Entrance Stairs-Number of Steps: 4 Home Layout: Two level;Able to live on main level with bedroom/bathroom Home Equipment: None      Prior Function                 Hand Dominance        Extremity/Trunk Assessment   Upper Extremity Assessment: Generalized weakness           Lower Extremity Assessment: Generalized weakness      Cervical / Trunk Assessment: Other exceptions  Communication      Cognition Arousal/Alertness: Lethargic Behavior During Therapy: WFL for tasks assessed/performed Overall Cognitive Status: Impaired/Different from baseline Area of Impairment: Orientation;Safety/judgement;Awareness Orientation Level: Time;Disoriented to  General Comments      Exercises        Assessment/Plan    PT Assessment Patient needs continued PT services  PT Diagnosis Difficulty walking;Generalized weakness;Altered mental status   PT Problem List Decreased activity tolerance;Decreased balance;Decreased strength;Decreased knowledge of use of DME;Decreased safety awareness;Decreased range of motion;Decreased knowledge of precautions;Decreased mobility;Decreased  cognition  PT Treatment Interventions DME instruction;Functional mobility training;Gait training;Therapeutic activities;Therapeutic exercise;Patient/family education   PT Goals (Current goals can be found in the Care Plan section) Acute Rehab PT Goals Patient Stated Goal: to swim PT Goal Formulation: With patient/family Time For Goal Achievement: 09/19/14 Potential to Achieve Goals: Good    Frequency Min 3X/week   Barriers to discharge        Co-evaluation               End of Session   Activity Tolerance: Patient limited by fatigue Patient left: in bed;with call bell/phone within reach;with bed alarm set;with family/visitor present Nurse Communication: Mobility status;Need for lift equipment         Time: 0375-4360 PT Time Calculation (min) (ACUTE ONLY): 60 min   Charges:   PT Evaluation $Initial PT Evaluation Tier I: 1 Procedure PT Treatments $Therapeutic Activity: 38-52 mins   PT G Codes:        Claretha Cooper 09/05/2014, 5:15 PM Tresa Endo PT [phone]

## 2014-09-05 NOTE — Progress Notes (Signed)
CRITICAL VALUE ALERT  Critical value received: lactate 2.8 Date of notification:  09/05/14 Time of notification:  0610 Critical value read back:Yes.    Nurse who received alert:  c Ollie Delano  MD notified (1st page):  N/a-value consistent (and improved) from previous values Time of first page:  n/a MD notified (2nd page):  Time of second page:  Responding MD:  n/a  Time MD responded: n/a

## 2014-09-05 NOTE — Consult Note (Signed)
Chief Complaint: Chief Complaint  Patient presents with  . Altered Mental Status    Referring Physician(s): CCM  History of Present Illness: Joann Castaneda is a 59 y.o. female with history of portal hypertension, ascites, cholangiocarcinoma and partial hepatic resection at [facility] in March 2016. She also underwent radiation/ chemotherapy. She was recently admitted to [facility] with encephalopathy, weakness, right upper quadrant abdominal pain, hematemesis, hypertension and anemia. Endoscopy revealed esophageal varices with a small gastric ulcer and antral gastritis. Bleeding was felt to be secondary to radiation-induced duodenitis. Patient was subsequently intubated, transfused and and placed on pressors. Blood cultures revealed Klebsiella. Patient was extubated on 7/14 and is no longer on pressors. CT of the abdomen and pelvis performed on 7/13 revealed cystic lesions in the right hepatic lobe measuring up to 5 cm which contained internal gas and were suspicious for hepatic abscesses. In addition there was diffuse gastric, small bowel colonic wall thickening, moderate ascites, small bilateral pleural effusions, splenomegaly and gastrohepatic ligament venous collaterals, consistent with portal hypertension. Request is now been received for ultrasound-guided paracentesis and aspiration/possible drainage of hepatic air/ fluid collections.   Past Medical History  Diagnosis Date  . Anxiety   . Elevated liver enzymes 03-26-13  . Jaundice 03-26-13    Jaundice -presently skin and sclera  . DRESS syndrome   . Hepatic encephalopathy   . Cancer     Liver  . GI bleeding     Past Surgical History  Procedure Laterality Date  . Knee arthroscopy Bilateral     both knees  . Eus N/A 04/03/2013    Procedure: ESOPHAGEAL ENDOSCOPIC ULTRASOUND (EUS) RADIAL;  Surgeon: Arta Silence, MD;  Location: [facility];  Service: Endoscopy;  Laterality: N/A;  bx of liver lesion  . Liver resection    .  Cholecystectomy    . Esophagogastroduodenoscopy N/A 09/03/2014    Procedure: ESOPHAGOGASTRODUODENOSCOPY (EGD);  Surgeon: Teena Irani, MD;  Location: [facility];  Service: Endoscopy;  Laterality: N/A;    Allergies: Rocephin; Vancomycin; Zosyn; Amoxicillin; and Sulfa antibiotics  Medications: Prior to Admission medications   Medication Sig Start Date End Date Taking? Authorizing Provider  ciprofloxacin (CIPRO) 500 MG tablet Take 1 tablet (500 mg total) by mouth daily with breakfast. 02/12/14  Yes Robbie Lis, MD  furosemide (LASIX) 40 MG tablet Take 40 mg by mouth daily. 08/04/14  Yes Historical Provider, MD  lactulose (CHRONULAC) 10 GM/15ML solution Take 45 mLs (30 g total) by mouth 2 (two) times daily. Patient taking differently: Take 30 g by mouth 3 (three) times daily.  04/04/14  Yes Bonnielee Haff, MD  metoCLOPramide (REGLAN) 5 MG tablet Take 5 mg by mouth 4 (four) times daily as needed for nausea (nausea).   Yes Historical Provider, MD  nadolol (CORGARD) 20 MG tablet Take 10 mg by mouth daily.   Yes Historical Provider, MD  ondansetron (ZOFRAN-ODT) 4 MG disintegrating tablet Take 4 mg by mouth at bedtime as needed for nausea.  06/10/14  Yes Historical Provider, MD  pantoprazole (PROTONIX) 40 MG tablet Take 1 tablet (40 mg total) by mouth every 12 (twelve) hours. 02/12/14  Yes Robbie Lis, MD  rifaximin (XIFAXAN) 550 MG TABS tablet Take 1 tablet (550 mg total) by mouth 2 (two) times daily. 02/12/14  Yes Robbie Lis, MD  spironolactone (ALDACTONE) 50 MG tablet Take 1 tablet (50 mg total) by mouth daily. 02/12/14 02/12/15 Yes Robbie Lis, MD  sucralfate (CARAFATE) 1 GM/10ML suspension Take 10  mLs (1 g total) by mouth 3 (three) times daily with meals. Patient not taking: Reported on 09/02/2014 02/12/14   Robbie Lis, MD     Family History  Problem Relation Age of Onset  . Hypertension Mother   . Hypertension Father     History   Social History  . Marital Status: Married     Spouse Name: N/A  . Number of Children: N/A  . Years of Education: N/A   Social History Main Topics  . Smoking status: Never Smoker   . Smokeless tobacco: Not on file  . Alcohol Use: No  . Drug Use: No  . Sexual Activity: Yes   Other Topics Concern  . None   Social History Narrative      Review of Systems see above  Vital Signs: BP 105/67 mmHg  Pulse 94  Temp(Src) 96.8 F (36 C) (Core (Comment))  Resp 14  Ht 5\' 6"  (1.676 m)  Wt 123 lb 14.4 oz (56.2 kg)  BMI 20.01 kg/m2  SpO2 99%  Physical Exam patient opens eyes, converses, slightly lethargic and still confused; heart with regular rate and rhythm. Chest with diminished breath sounds bases/ scattered crackles. Abdomen soft, positive bowel sounds, mildly distended, mild right upper quadrant tenderness, linear scar right upper quadrant from prior partial hepatectomy; extremities with no edema; scattered ecchymoses on skin.  Mallampati Score:  MD Evaluation Airway: WNL Heart: WNL Abdomen: WNL Chest/ Lungs: WNL ASA  Classification: 3 Mallampati/Airway Score: Two  Imaging: Ct Abdomen Pelvis Wo Contrast  09/03/2014   CLINICAL DATA:  Right upper quadrant pain for several days. Diarrhea. Hematemesis. Surgical resection of cholangiocarcinoma approximately 4 months ago.  EXAM: CT ABDOMEN AND PELVIS WITHOUT CONTRAST  TECHNIQUE: Multidetector CT imaging of the abdomen and pelvis was performed following the standard protocol without IV contrast.  COMPARISON:  Ultrasound on 09/02/2014 and MRI on 03/23/2013  FINDINGS: Lower chest: Small bilateral pleural effusions and bibasilar atelectasis noted. Small hiatal hernia also demonstrated.  Hepatobiliary: Surgical clips are seen from previous left hepatectomy. 2 cystic lesions containing air-fluid levels are seen in the dome of the right hepatic lobe which measure approximately 3.5 and 5.0 cm. A third low-attenuation lesion containing small amount of internal areas seen in the inferior right  hepatic lobe which measures 2.4 cm. These are suspicious for hepatic abscesses. Mild pneumobilia also noted.  Pancreas: No mass or inflammatory process visualized on this unenhanced exam.  Spleen: Mild splenomegaly, with spleen measuring approximately 13 cm in length.  Adrenal Glands:  No masses identified.  Kidneys/Urinary tract: No evidence of urolithiasis or hydronephrosis. Foley catheter seen within the bladder.  Stomach/Bowel/Peritoneum: Moderate ascites is seen within the abdomen and pelvis. Diffuse gastric, small bowel and colonic wall thickening is seen. No evidence of bowel obstruction.  Vascular/Lymphatic: No pathologically enlarged lymph nodes identified. Venous collaterals are seen in the gastrosplenic ligament, consistent with portal venous hypertension.  Reproductive:  No mass or other significant abnormality noted.  Other:  None.  Musculoskeletal:  No suspicious bone lesions identified.  IMPRESSION: Three cystic lesions of the right hepatic lobe measuring up to 5 cm which contain internal gas, and are suspicious for hepatic abscesses.  Diffuse gastric, small bowel, and colonic wall thickening. Differential diagnosis includes hypoalbuminemia and infectious or inflammatory etiologies.  Moderate ascites, small bilateral pleural effusions, and bibasilar atelectasis.  Splenomegaly and gastrohepatic ligament venous collaterals, consistent with portal venous hypertension.   Electronically Signed   By: Earle Gell M.D.   On: 09/03/2014  14:05   Dg Abd 1 View  09/03/2014   CLINICAL DATA:  NG tube placement.  Hematemesis tonight.  EXAM: ABDOMEN - 1 VIEW  COMPARISON:  None.  FINDINGS: Enteric tube tip is in the left upper quadrant consistent with location in the upper stomach. Surgical clips in the right upper quadrant. No small or large bowel dilatation.  IMPRESSION: Enteric tube tip is in the left upper quadrant consistent with location in the upper stomach.   Electronically Signed   By: Lucienne Capers  M.D.   On: 09/03/2014 01:41   Dg Chest Port 1 View  09/05/2014   CLINICAL DATA:  Respiratory failure.  Endotracheal tube removed  EXAM: PORTABLE CHEST - 1 VIEW  COMPARISON:  September 04, 2014  FINDINGS: Endotracheal tube and nasogastric tube have been removed. Left jugular catheter tip is in the superior vena cava. Port-A-Cath tip is in the superior cava near the cavoatrial junction. No pneumothorax. There is stable underlying interstitial edema. The previously and right lower lobe consolidation has essentially completely resolved. No new opacity. Heart size and pulmonary vascularity are normal. No adenopathy.  IMPRESSION: Stable underlying interstitial edema. No airspace consolidation. Catheter positions as described without pneumothorax.   Electronically Signed   By: Lowella Grip III M.D.   On: 09/05/2014 07:10   Dg Chest Port 1 View  09/04/2014   CLINICAL DATA:  Intubation.  EXAM: PORTABLE CHEST - 1 VIEW  COMPARISON:  None.  FINDINGS: Endotracheal tube, left IJ line, and NG tube in good anatomic position. Power port catheter in good anatomic position. Mediastinum hilar structures normal. Heart size normal. Partial clearing of right infrahilar/right lower lower lobe infiltrate. No pleural effusion or pneumothorax.  IMPRESSION: 1. Lines and tubes in stable position. 2. Partial clearing of right infrahilar/right lower lobe infiltrate.   Electronically Signed   By: Marcello Moores  Register   On: 09/04/2014 07:08   Dg Chest Port 1 View  09/03/2014   CLINICAL DATA:  Hypoxia.  History of cholangiocarcinoma  EXAM: PORTABLE CHEST - 1 VIEW  COMPARISON:  Study obtained earlier in the day  FINDINGS: Endotracheal tube tip is 4.4 cm above the carina. Left jugular catheter tip is in the superior vena cava. Port-A-Cath tip is in the superior cava. Nasogastric tube tip and side port are below the diaphragm. No pneumothorax. There is airspace opacity inferior to the right hilum in the right lower lung zone, stable from earlier  in the day. There is underlying interstitial edema. Heart size and pulmonary vascularity are within normal limits. No adenopathy.  IMPRESSION: Tube and catheter positions as described without pneumothorax. Stable right lower lobe infiltrate. Concern for aspiration. Underlying interstitial edema. Differential considerations must include noncardiogenic edema, atypical infectious pneumonia, or possibly allergic type reaction. Cardiac silhouette within normal limits.   Electronically Signed   By: Lowella Grip III M.D.   On: 09/03/2014 10:58   Dg Chest Port 1 View  09/03/2014   CLINICAL DATA:  Tachypnea  EXAM: PORTABLE CHEST - 1 VIEW  COMPARISON:  09/02/2014  FINDINGS: Power injectable right Port-A-Cath tip:  Lower SVC.  The patient is rotated to the left on today's radiograph, reducing diagnostic sensitivity and specificity. New bilateral abnormal interstitial accentuation noted with airway thickening and right infrahilar airspace opacity. Compensating for the leftward rotation, heart size is within normal limits. No pleural effusion observed.  IMPRESSION: 1. New right infrahilar airspace opacity, possibly pneumonia or aspiration pneumonitis. 2. Bilateral new interstitial accentuation, potentially from noncardiogenic edema or atypical pneumonia.  Electronically Signed   By: Van Clines M.D.   On: 09/03/2014 07:03   Dg Chest Port 1 View  09/02/2014   CLINICAL DATA:  Altered mental status, diarrhea and hematemesis for 2 days. History of liver cancer.  EXAM: PORTABLE CHEST - 1 VIEW  COMPARISON:  06/07/2014  FINDINGS: The power port is stable. The cardiac silhouette, mediastinal and hilar contours are normal. The lungs are clear. No pleural effusions or pneumothorax. Surgical changes noted in the right upper abdomen.  IMPRESSION: No acute cardiopulmonary findings.   Electronically Signed   By: Marijo Sanes M.D.   On: 09/02/2014 14:49   US Abdomen Limited Ruq  09/02/2014   CLINICAL DATA:  59 year old  female with history of liver cancer post liver resection. Prior cholecystectomy. Nausea and vomiting with abdominal pain for 3 days. Subsequent encounter.  EXAM: US ABDOMEN LIMITED - RIGHT UPPER QUADRANT  COMPARISON:  02/07/2014 ultrasound.  FINDINGS: Gallbladder:  Post cholecystectomy.  Common bile duct:  Diameter: 2.3 mm.  Pneumobilia.  Liver:  Heterogeneous appearance of the liver with complex 2.3 x 1.9 x 1.8 cm cystic lesion within the right lobe of the liver. Etiology indeterminate. Question abscess or mass or focal bile collection. Pneumobilia.  Small amount of ascites.  IMPRESSION: Post partial resection of the liver. Complex cystic lesion within the residual liver measures up to 2.3 cm. Question abscess versus mass or possibly focal bile collection.  Pneumobilia.  Small amount of ascites.   Electronically Signed   By: Genia Del M.D.   On: 09/02/2014 17:01    Labs:  CBC:  Recent Labs  09/02/14 2100 09/03/14 0345 09/03/14 1110 09/04/14 0150 09/05/14 0400  WBC 15.4* 19.9*  --  23.7* 13.5*  HGB 5.8* 7.4* 7.8* 7.9* 7.5*  HCT 17.3* 21.3* 22.4* 22.5* 21.9*  PLT 73* 51*  --  41* 37*    COAGS:  Recent Labs  02/06/14 1444  09/02/14 1231 09/03/14 1110 09/04/14 0150 09/05/14 0400  INR 1.04  < > 1.92* 1.58* 1.57* 1.30  APTT 21*  --   --   --   --  34  < > = values in this interval not displayed.  BMP:  Recent Labs  09/03/14 1110 09/03/14 1800 09/04/14 0150 09/05/14 0400  NA 133* 131* 133* 135  K 3.4* 3.9 3.7 3.4*  CL 100* 101 104 108  CO2 22 20* 19* 21*  GLUCOSE 144* 144* 177* 174*  BUN 82* 78* 81* 81*  CALCIUM 7.0* 7.2* 7.4* 7.5*  CREATININE 1.95* 1.90* 1.77* 1.61*  GFRNONAA 27* 27* 30* 33*  GFRAA 31* 32* 35* 39*    LIVER FUNCTION TESTS:  Recent Labs  04/04/14 0511 06/07/14 1351 09/02/14 1231 09/05/14 0400  BILITOT 2.4* 1.2 1.6* 2.3*  AST 130* 131* 353* 158*  ALT 73* 62* 139* 87*  ALKPHOS 524* 1219* 707* 586*  PROT 4.2* 5.5* 6.1* 4.9*  ALBUMIN 1.5* 1.5*  1.8* 1.6*    TUMOR MARKERS: No results for input(s): AFPTM, CEA, CA199, CHROMGRNA in the last 8760 hours.  Assessment and Plan: Patient with history of cholangiocarcinoma and prior partial hepatic resection in March 2016 at [facility], status post chemoradiation. Admitted to [facility] with septic/hemorrhagic shock / Klebsiella bacteremia, acute kidney injury, upper GI bleed, encephalopathy, coagulopathy, elevated LFTs, thrombocytopenia, respiratory failure- currently extubated and off pressors. Recent imaging has revealed ascites and air-fluid collections in the right hepatic lobe suspicious for abscesses. Request now received for ultrasound-guided paracentesis and aspiration/possible drainage of hepatic air /  fluid collections. All imaging studies have been reviewed and patient appears to be candidate for both procedures. Case discussed with Dr. Halford Chessman. Details/risks of procedures, including but not limited to internal bleeding, infection/recurrent sepsis, inability to drain abscesses, worsening renal failure and death discussed with patient's husband with his understanding and consent. Procedure is tent planned for 7/16 a.m. Check am labs. Plts currently 37k. Will need transfusion preprocedure.      Signed: D. Rowe Robert 09/05/2014, 4:37 PM   I spent a total of   40 minutes in face to face in clinical consultation, greater than 50% of which was counseling/coordinating care for ultrasound-guided paracentesis and aspiration/possible drainage of hepatic air/ fluid collections.

## 2014-09-05 NOTE — Progress Notes (Addendum)
Joann Castaneda 10:25 AM  Subjective: Patient doing much better without signs of bleeding and extubated and although a little lethargic answering all questions appropriately and her case was again discussed with her husband as well and he is waiting on an decision from IR regarding the paracentesis and liver lesions after retrieving the CAT scan from [facility]  Objective: Vital signs stable afebrile no acute distress exam pertinent for mild ascites soft nontender good bowel sounds hemoglobin stable white count decreased creatinine decreased INR improved LFTs stable  Assessment: Multiple medical problems with GI bleed question was secondary to radiation damage  Plan: Once interventional radiology either decides not to do anything or finishes their procedure may slowly advance diet from my standpoint and call me when necessary otherwise I will check on tomorrow and okay to wean octreotide as well  [facility] E  Pager [phone] After 5PM or if no answer call [phone]

## 2014-09-06 ENCOUNTER — Inpatient Hospital Stay (HOSPITAL_COMMUNITY): Payer: BLUE CROSS/BLUE SHIELD

## 2014-09-06 LAB — TYPE AND SCREEN
ABO/RH(D): A POS
ANTIBODY SCREEN: NEGATIVE
DAT, IgG: NEGATIVE
Donor AG Type: NEGATIVE
Donor AG Type: NEGATIVE
Donor AG Type: NEGATIVE
Donor AG Type: NEGATIVE
Donor AG Type: NEGATIVE
UNIT DIVISION: 0
UNIT DIVISION: 0
Unit division: 0
Unit division: 0
Unit division: 0

## 2014-09-06 LAB — CBC
HEMATOCRIT: 23.8 % — AB (ref 36.0–46.0)
Hemoglobin: 8.1 g/dL — ABNORMAL LOW (ref 12.0–15.0)
MCH: 27.5 pg (ref 26.0–34.0)
MCHC: 34 g/dL (ref 30.0–36.0)
MCV: 80.7 fL (ref 78.0–100.0)
Platelets: 48 10*3/uL — ABNORMAL LOW (ref 150–400)
RBC: 2.95 MIL/uL — AB (ref 3.87–5.11)
RDW: 16.7 % — ABNORMAL HIGH (ref 11.5–15.5)
WBC: 9.8 10*3/uL (ref 4.0–10.5)

## 2014-09-06 LAB — COMPREHENSIVE METABOLIC PANEL
ALT: 97 U/L — ABNORMAL HIGH (ref 14–54)
ANION GAP: 4 — AB (ref 5–15)
AST: 189 U/L — ABNORMAL HIGH (ref 15–41)
Albumin: 1.6 g/dL — ABNORMAL LOW (ref 3.5–5.0)
Alkaline Phosphatase: 778 U/L — ABNORMAL HIGH (ref 38–126)
BUN: 65 mg/dL — AB (ref 6–20)
CO2: 21 mmol/L — ABNORMAL LOW (ref 22–32)
CREATININE: 1.32 mg/dL — AB (ref 0.44–1.00)
Calcium: 7.4 mg/dL — ABNORMAL LOW (ref 8.9–10.3)
Chloride: 111 mmol/L (ref 101–111)
GFR calc non Af Amer: 43 mL/min — ABNORMAL LOW (ref >60)
GFR, EST AFRICAN AMERICAN: 49 mL/min — AB (ref >60)
GLUCOSE: 112 mg/dL — AB (ref 65–99)
Potassium: 3 mmol/L — ABNORMAL LOW (ref 3.5–5.1)
SODIUM: 136 mmol/L (ref 135–145)
TOTAL PROTEIN: 5 g/dL — AB (ref 6.5–8.1)
Total Bilirubin: 2.6 mg/dL — ABNORMAL HIGH (ref 0.3–1.2)

## 2014-09-06 LAB — BODY FLUID CELL COUNT WITH DIFFERENTIAL
LYMPHS FL: 10 %
MONOCYTE-MACROPHAGE-SEROUS FLUID: 3 % — AB (ref 50–90)
Neutrophil Count, Fluid: 87 % — ABNORMAL HIGH (ref 0–25)
Total Nucleated Cell Count, Fluid: 191 cu mm (ref 0–1000)

## 2014-09-06 LAB — PROTIME-INR
INR: 1.33 (ref 0.00–1.49)
PROTHROMBIN TIME: 16.6 s — AB (ref 11.6–15.2)

## 2014-09-06 LAB — GLUCOSE, CAPILLARY
GLUCOSE-CAPILLARY: 71 mg/dL (ref 65–99)
GLUCOSE-CAPILLARY: 112 mg/dL — AB (ref 65–99)
Glucose-Capillary: 100 mg/dL — ABNORMAL HIGH (ref 65–99)
Glucose-Capillary: 116 mg/dL — ABNORMAL HIGH (ref 65–99)

## 2014-09-06 LAB — GLUCOSE, SEROUS FLUID: Glucose, Fluid: 93 mg/dL

## 2014-09-06 LAB — PROTEIN, BODY FLUID: Total protein, fluid: <3 g/dL

## 2014-09-06 LAB — ALBUMIN, FLUID (OTHER): Albumin, Fluid: <1 g/dL

## 2014-09-06 LAB — APTT: aPTT: 31 seconds (ref 24–37)

## 2014-09-06 MED ORDER — MIDAZOLAM HCL 2 MG/2ML IJ SOLN
INTRAMUSCULAR | Status: AC
  Administered 2014-09-06: 1 mg
  Filled 2014-09-06: qty 6

## 2014-09-06 MED ORDER — IMIPENEM-CILASTATIN 250 MG IV SOLR
250.0000 mg | Freq: Four times a day (QID) | INTRAVENOUS | Status: DC
  Administered 2014-09-06 – 2014-09-08 (×8): 250 mg via INTRAVENOUS
  Filled 2014-09-06 (×9): qty 250

## 2014-09-06 MED ORDER — FENTANYL CITRATE (PF) 100 MCG/2ML IJ SOLN
INTRAMUSCULAR | Status: AC
  Administered 2014-09-06: 50 ug
  Filled 2014-09-06: qty 6

## 2014-09-06 MED ORDER — POTASSIUM CHLORIDE 10 MEQ/50ML IV SOLN
10.0000 meq | INTRAVENOUS | Status: AC
  Administered 2014-09-06 (×3): 10 meq via INTRAVENOUS
  Filled 2014-09-06: qty 50

## 2014-09-06 NOTE — Procedures (Signed)
Interventional Radiology Procedure Note  Procedure: US guided liver abscess drainage.  Right lobe.  2 abscess cavities drained.   Also, diagnostic Paracentesis.  430cc of thin fluid. 1:  Superior RUQ.  Dome of liver. Sample sent 2:  Inferior RUQ.  Right liver.  Sample sent Para:  Sample sent.  Complications: None.  Recommendations:  - Routine drain care. - Daily output of Drains #1, #2.  Both are liver abscess. - Follow up result.   Signed,  Dulcy Fanny. Earleen Newport, DO

## 2014-09-06 NOTE — Progress Notes (Signed)
eLink Physician-Brief Progress Note Patient Name: SHADEN HIGLEY DOB: [birth year] MRN: [id number]   Date of Service  09/06/2014  HPI/Events of Note  Patient NPO for liver abscess drain placement. Procedure is completed. Now patient [place].   eICU Interventions  Will advance diet to clear liquids.      Intervention Category Minor Interventions: Routine modifications to care plan (e.g. PRN medications for pain, fever)  Olivine Hiers Eugene 09/06/2014, 4:16 PM

## 2014-09-06 NOTE — Sedation Documentation (Signed)
Vital signs stable. 

## 2014-09-06 NOTE — Progress Notes (Signed)
Joann Castaneda 11:03 AM  Subjective: Patient little more lethargic than yesterday and is still having some black bowel movements but no new complaints and is about to go to radiology for her procedures  Objective: Vital signs stable afebrile no acute distress abdomen a little more ascites hemoglobin stable  Assessment: Multiple medical problems  Plan: Will check on tomorrow and advance diet as per critical care team and interventional radiology based on how she does with her procedures  [facility] E  Pager [phone] After 5PM or if no answer call [phone]

## 2014-09-06 NOTE — Sedation Documentation (Signed)
Patient is resting comfortably. 

## 2014-09-06 NOTE — Progress Notes (Addendum)
PULMONARY / CRITICAL CARE MEDICINE   Name: Joann Castaneda MRN: [id number] DOB: May 20, 1955    ADMISSION DATE:  09/02/2014 CONSULTATION DATE:  09/02/14  REFERRING MD :  ED physician  CHIEF COMPLAINT:  Hematemesis/hypotension  INITIAL PRESENTATION: 59 yo female presented with encephalopathy, frequent stools, weakenss/fatigue.  She has hx of cholangiocarcinoma with portal HTN and ascites s/p resection March 2016, and s/p chemo-radiation at [facility].  Developed hematemesis and transferred to ICU, shock.  STUDIES:  7/12 Rt upper quadrant u/s >> complex cysts in liver 7/13 CT abd/pelvis>>> b/l effusions with ATX, 3 cystic lesions in Rt liver ?abscess, mild splenomegaly, moderate ascites, diffuse gastric/SB/colon wall thickening 7/13 EGD >> small gastric ulcer, no active bleeding, non bleeding esophageal varices, antral gastritis and duodenitis likely from radiation  SIGNIFICANT EVENTS: 7/12 Admitted with GI bleed/hematemesis, worsening encephalopathy 7/13 transfuse 2 units PRBC, EGD, pressors, VDRF 7/14 Off pressors, extubated  SUBJECTIVE: On warmer  Appears tired , husband at bedside Denies pain  VITAL SIGNS: Temp:  [95.7 F (35.4 C)-97.7 F (36.5 C)] 95.7 F (35.4 C) (07/16 0500) Resp:  [14-24] 15 (07/16 0500) BP: (99-133)/(62-80) 127/76 mmHg (07/16 0500) SpO2:  [96 %-100 %] 98 % (07/16 0500) Weight:  [134 lb 11.2 oz (61.1 kg)] 134 lb 11.2 oz (61.1 kg) (07/16 0400) INTAKE / OUTPUT:  Intake/Output Summary (Last 24 hours) at 09/06/14 0845 Last data filed at 09/06/14 0600  Gross per 24 hour  Intake 2932.92 ml  Output    855 ml  Net 2077.92 ml    PHYSICAL EXAMINATION: General: pleasant, chr ill appearing Neuro: more alert, follows commands, non focal HEENT: no sinus tenderness Cardiovascular: regular Lungs: scattered crackles, no wheeze Abdomen:  Scarring on abd, rounded with mild RUQ tenderness Musculoskeletal:  Diffuse muscle wasting Skin: multiple areas of ecchymosis    LABS:  CBC  Recent Labs Lab 09/04/14 0150 09/05/14 0400 09/06/14 0427  WBC 23.7* 13.5* 9.8  HGB 7.9* 7.5* 8.1*  HCT 22.5* 21.9* 23.8*  PLT 41* 37* 48*   Coag's  Recent Labs Lab 09/04/14 0150 09/05/14 0400 09/06/14 0427  APTT  --  34 31  INR 1.57* 1.30 1.33   BMET  Recent Labs Lab 09/04/14 0150 09/05/14 0400 09/06/14 0427  NA 133* 135 136  K 3.7 3.4* 3.0*  CL 104 108 111  CO2 19* 21* 21*  BUN 81* 81* 65*  CREATININE 1.77* 1.61* 1.32*  GLUCOSE 177* 174* 112*   Electrolytes  Recent Labs Lab 09/03/14 0645  09/04/14 0150 09/05/14 0400 09/06/14 0427  CALCIUM  --   < > 7.4* 7.5* 7.4*  MG 1.5*  --  3.1* 3.0*  --   PHOS 2.7  --  4.6 4.3  --   < > = values in this interval not displayed.   Sepsis Markers  Recent Labs Lab 09/02/14 2100  09/03/14 1110 09/04/14 0850 09/05/14 0430  LATICACIDVEN  --   < > 5.9* 5.0* 2.9*  PROCALCITON 45.91  --   --   --   --   < > = values in this interval not displayed.   ABG  Recent Labs Lab 09/02/14 2228 09/03/14 1112  PHART 7.581* 7.474*  PCO2ART 16.6* 28.0*  PO2ART 92.8 171*   Liver Enzymes  Recent Labs Lab 09/02/14 1231 09/05/14 0400 09/06/14 0427  AST 353* 158* 189*  ALT 139* 87* 97*  ALKPHOS 707* 586* 778*  BILITOT 1.6* 2.3* 2.6*  ALBUMIN 1.8* 1.6* 1.6*   Cardiac Enzymes  Recent Labs Lab  09/02/14 2340  TROPONINI 0.05*   Glucose  Recent Labs Lab 09/05/14 0745 09/05/14 1225 09/05/14 1614 09/05/14 1948 09/06/14 0038 09/06/14 0443  GLUCAP 135* 144* 137* 157* 116* 100*    Imaging No results found.   ASSESSMENT / PLAN:  PULMONARY ETT 7/13 >> 7/14 A: Atelectasis. P:   Bronchial hygiene Mobilize as tolerated Oxygen to keep SpO2 > 92%  CARDIOVASCULAR Port >> Lt IJ CVL 7/13 >> 7/15 A:  Septic/hemorrhagic shock >> off pressors 7/14. P:  Goal even fluid balance  RENAL A:   AKI >> improving. Metabolic acidosis >> lactic acidosis, and non gap acidosis >>  resolved. Hyperkalemia >> resolved, now hyopkalemic. Hypomagnesemia >> resolved. P:   F/u & replete electrolytes Monitor renal fx  GASTROINTESTINAL A:   Upper GI bleed 2nd to PUD, gastritis/duodenitis. Hx of cholangiocarcinoma with portal HTN, ascites, splenomegaly, esophageal varices. Liver cysts P:   Protonix, taper octreotide to off per GI Continue lactulose, rifaximin Advance diet when okay with IR Hold outpt lasix, corgard, aldactone for now IR plan to drain liver abscess & ascites  HEMATOLOGIC A:   Acute blood loss anemia 2nd to Upper GI bleed. Thrombocytopenia, coagulopathy in setting of liver disease. P:  F/u CBC, coags Transfuse for Hb < 7 or bleeding Transfuse plts for procedure SCD's  INFECTIOUS A:   Septic shock with Klebsiella bacteremia >> likely intra-abdominal source >> Procalcitonin 45.91 from 7/12. Rt lung infiltrate on CXR >> initial concern for aspiration, but most likely atelectasis. DRESS syndrome from PCN and cephalosporins. P:   primaxin 7/12 >> 7/16  ceftx 7/16 >>  BCx2 7/12 >> Klebsiella species  Sputum 7/14>>  ENDOCRINE A:   Hypoglycemia > improved. P:   Monitor CBG's Continue Dextrose in IV fluids until she eats  NEUROLOGIC A:   Acute metabolic/sepsis encephalopathy >> improving. Deconditioning. P:   Monitor mental status PT  Summary: Klebsiella septic shock from GI source & liver abscesses - IR to drain today  Husband is okay with continuing care at [facility] and defer transfer to [facility] since she is improving.   Updated pt's husband at bedside.  The patient is critically ill with multiple organ systems failure and requires high complexity decision making for assessment and support, frequent evaluation and titration of therapies, application of advanced monitoring technologies and extensive interpretation of multiple databases. Critical Care Time devoted to patient care services described in this note independent of APP time is 31  minutes.    Kara Mead MD. Shade Flood. [facility] Pager [phone] If no response call [phone]   09/06/2014, 8:45 AM

## 2014-09-06 NOTE — Progress Notes (Signed)
[facility] Pharmacy to renally adjust antibiotics (Imipenem) Indication: GNR bacteremia  Allergies  Allergen Reactions  . Rocephin [Ceftriaxone] Other (See Comments)    Dress syndrome  . Vancomycin Other (See Comments)    Dress syndrome.   Marland Kitchen Zosyn [Piperacillin Sod-Tazobactam So]     Dress syndrome  . Amoxicillin Rash  . Sulfa Antibiotics Rash    Patient Measurements: Height: 5\' 6"  (167.6 cm) Weight: 134 lb 11.2 oz (61.1 kg) IBW/kg (Calculated) : 59.3 Adjusted Body Weight:   Vital Signs: Temp: 98.2 F (36.8 C) (07/16 1026) Temp Source: Core (Comment) (07/16 1026) BP: 110/65 mmHg (07/16 1026) Pulse Rate: 93 (07/16 0920) Intake/Output from previous day: 07/15 0701 - 07/16 0700 In: 3042.9 [P.O.:720; I.V.:2022.9; IV Piggyback:300] Out: 855 [Urine:855] Intake/Output from this shift: Total I/O In: 299 [I.V.:100; Blood:199] Out: -   Labs:  Recent Labs  09/04/14 0150 09/05/14 0400 09/06/14 0427  WBC 23.7* 13.5* 9.8  HGB 7.9* 7.5* 8.1*  PLT 41* 37* 48*  CREATININE 1.77* 1.61* 1.32*   Estimated Creatinine Clearance: 41.9 mL/min (by C-G formula based on Cr of 1.32). No results for input(s): VANCOTROUGH, VANCOPEAK, VANCORANDOM, GENTTROUGH, GENTPEAK, GENTRANDOM, TOBRATROUGH, TOBRAPEAK, TOBRARND, AMIKACINPEAK, AMIKACINTROU, AMIKACIN in the last 72 hours.   Microbiology: Recent Results (from the past 720 hour(s))  Culture, blood (routine x 2)     Status: None   Collection Time: 09/02/14  1:51 PM  Result Value Ref Range Status   Specimen Description BLOOD LEFT HAND  Final   Special Requests BOTTLES DRAWN AEROBIC AND ANAEROBIC 2ML  Final   Culture  Setup Time   Final    GRAM NEGATIVE RODS IN BOTH AEROBIC AND ANAEROBIC BOTTLES CRITICAL RESULT CALLED TO, READ BACK BY AND VERIFIED WITH: A [facility] 09/03/14 AT 0358 RHOLMES CONFIRMED BY M CAMPBELL    Culture   Final    KLEBSIELLA SPECIES SUSCEPTIBILITIES PERFORMED ON PREVIOUS CULTURE  WITHIN THE LAST 5 DAYS. Performed at [facility]    Report Status 09/05/2014 FINAL  Final  Culture, blood (routine x 2)     Status: None   Collection Time: 09/02/14  2:10 PM  Result Value Ref Range Status   Specimen Description BLOOD RIGHT ANTECUBITAL  Final   Special Requests BOTTLES DRAWN AEROBIC AND ANAEROBIC 5CC  Final   Culture  Setup Time   Final    GRAM NEGATIVE RODS IN BOTH AEROBIC AND ANAEROBIC BOTTLES CRITICAL RESULT CALLED TO, READ BACK BY AND VERIFIED WITH: A [facility] 09/03/14 AT [place] CONFIRMED BY M CAMPBELL    Culture   Final    KLEBSIELLA SPECIES Performed at [facility]    Report Status 09/05/2014 FINAL  Final   Organism ID, Bacteria KLEBSIELLA SPECIES  Final      Susceptibility   Klebsiella species - MIC*    AMPICILLIN 16 RESISTANT Resistant     CEFAZOLIN <=4 SENSITIVE Sensitive     CEFEPIME <=1 SENSITIVE Sensitive     CEFTAZIDIME <=1 SENSITIVE Sensitive     CEFTRIAXONE <=1 SENSITIVE Sensitive     CIPROFLOXACIN >=4 RESISTANT Resistant     GENTAMICIN <=1 SENSITIVE Sensitive     IMIPENEM <=0.25 SENSITIVE Sensitive     TRIMETH/SULFA <=20 SENSITIVE Sensitive     AMPICILLIN/SULBACTAM 4 SENSITIVE Sensitive     PIP/TAZO 8 SENSITIVE Sensitive     * KLEBSIELLA SPECIES  Urine culture     Status: None   Collection Time: 09/02/14  2:23 PM  Result  Value Ref Range Status   Specimen Description URINE, CATHETERIZED  Final   Special Requests NONE  Final   Culture   Final    NO GROWTH 1 DAY Performed at [facility]    Report Status 09/03/2014 FINAL  Final  MRSA PCR Screening     Status: None   Collection Time: 09/02/14  5:01 PM  Result Value Ref Range Status   MRSA by PCR NEGATIVE NEGATIVE Final    Comment:        The GeneXpert MRSA Assay (FDA approved for NASAL specimens only), is one component of a comprehensive MRSA colonization surveillance program. It is not intended to diagnose MRSA infection nor to guide or monitor treatment  for MRSA infections.   Culture, respiratory (NON-Expectorated)     Status: None (Preliminary result)   Collection Time: 09/04/14  8:31 AM  Result Value Ref Range Status   Specimen Description TRACHEAL ASPIRATE  Final   Special Requests Normal  Final   Gram Stain PENDING  Incomplete   Culture   Final    Culture reincubated for better growth Performed at [facility]    Report Status PENDING  Incomplete    Medical History: Past Medical History  Diagnosis Date  . Anxiety   . Elevated liver enzymes 03-26-13  . Jaundice 03-26-13    Jaundice -presently skin and sclera  . DRESS syndrome   . Hepatic encephalopathy   . Cancer     Liver  . 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  Admitted 7/12 with worsening encephalopathy despite lactulose and Xifaxan.  Had 2 episodes of hematemesis upon admission with hypotension and dropping Hgb.  Chronic Cipro PTA.  He was started on linezolid and imipenem for possible intra-abdominal infection +/- UTI.  Several antibiotics to be avoided d/t h/o DRESS.    7/12 >> Linezolid 7/12 >> Primaxin  7/12 blood: Klebsiella 7/12 urine: NGF  Renal: Scr has improved since admission.   WBC now WNL  Dose changes/levels:  Goal of Therapy:  Dose per patient-specific parameters  Plan:  D5 imipenem/linezolid  Based on current est CrCl, change to imipenem 250mg  IV q6h   Continue to follow renal function and clinical course  Watch platelet count with linezolid  Dolly Rias RPh 09/06/2014, 10:36 AM Pager [phone]

## 2014-09-07 DIAGNOSIS — K750 Abscess of liver: Secondary | ICD-10-CM | POA: Insufficient documentation

## 2014-09-07 LAB — BASIC METABOLIC PANEL
Anion gap: 5 (ref 5–15)
BUN: 56 mg/dL — AB (ref 6–20)
CO2: 19 mmol/L — ABNORMAL LOW (ref 22–32)
Calcium: 7.4 mg/dL — ABNORMAL LOW (ref 8.9–10.3)
Chloride: 113 mmol/L — ABNORMAL HIGH (ref 101–111)
Creatinine, Ser: 1.03 mg/dL — ABNORMAL HIGH (ref 0.44–1.00)
GFR calc Af Amer: >60 mL/min (ref >60)
GFR, EST NON AFRICAN AMERICAN: 57 mL/min — AB (ref >60)
Glucose, Bld: 105 mg/dL — ABNORMAL HIGH (ref 65–99)
POTASSIUM: 3.7 mmol/L (ref 3.5–5.1)
Sodium: 137 mmol/L (ref 135–145)

## 2014-09-07 LAB — CBC
HEMATOCRIT: 23.8 % — AB (ref 36.0–46.0)
HEMOGLOBIN: 7.9 g/dL — AB (ref 12.0–15.0)
MCH: 27.9 pg (ref 26.0–34.0)
MCHC: 33.2 g/dL (ref 30.0–36.0)
MCV: 84.1 fL (ref 78.0–100.0)
Platelets: 94 10*3/uL — ABNORMAL LOW (ref 150–400)
RBC: 2.83 MIL/uL — ABNORMAL LOW (ref 3.87–5.11)
RDW: 16.9 % — AB (ref 11.5–15.5)
WBC: 9.3 10*3/uL (ref 4.0–10.5)

## 2014-09-07 LAB — GLUCOSE, CAPILLARY
GLUCOSE-CAPILLARY: 128 mg/dL — AB (ref 65–99)
Glucose-Capillary: 114 mg/dL — ABNORMAL HIGH (ref 65–99)
Glucose-Capillary: 125 mg/dL — ABNORMAL HIGH (ref 65–99)
Glucose-Capillary: 82 mg/dL (ref 65–99)
Glucose-Capillary: 115 mg/dL — ABNORMAL HIGH (ref 65–99)
Glucose-Capillary: 164 mg/dL — ABNORMAL HIGH (ref 65–99)
Glucose-Capillary: 133 mg/dL — ABNORMAL HIGH (ref 65–99)
Glucose-Capillary: 151 mg/dL — ABNORMAL HIGH (ref 65–99)

## 2014-09-07 LAB — CULTURE, RESPIRATORY

## 2014-09-07 LAB — PREPARE PLATELET PHERESIS
UNIT DIVISION: 0
Unit division: 0

## 2014-09-07 LAB — CULTURE, RESPIRATORY W GRAM STAIN: Special Requests: NORMAL

## 2014-09-07 LAB — GRAM STAIN

## 2014-09-07 MED ORDER — FENTANYL CITRATE (PF) 100 MCG/2ML IJ SOLN
INTRAMUSCULAR | Status: AC
  Filled 2014-09-07: qty 2

## 2014-09-07 MED ORDER — FENTANYL CITRATE (PF) 100 MCG/2ML IJ SOLN
25.0000 ug | INTRAMUSCULAR | Status: DC | PRN
  Administered 2014-09-07 (×2): 25 ug via INTRAVENOUS
  Filled 2014-09-07: qty 2

## 2014-09-07 NOTE — Progress Notes (Signed)
Joann Castaneda 12:04 PM  Subjective: Patient is doing better and tolerated her drains well and no signs of bleeding and supposedly they are about to advance her diet and her case was again discussed extensively with her and her husband and I answered all of his questions  Objective: Vital signs stable afebrile patient a little lethargic did get some pain medicines abdomen is soft minimal tenderness on the right nontender on the left mild ascites decreased BUN and creatinine hemoglobin stable  Assessment: Improved  Plan: We'll ask my partners to see her early next week and please call us sooner if we can be of any help with this very difficult situation but very nice patient and family  [facility]  Pager [phone] After 5PM or if no answer call [phone]

## 2014-09-07 NOTE — Progress Notes (Signed)
PULMONARY / CRITICAL CARE MEDICINE   Name: Joann Castaneda MRN: [id number] DOB: January 16, 1956    ADMISSION DATE:  09/02/2014 CONSULTATION DATE:  09/02/14  REFERRING MD :  ED physician  CHIEF COMPLAINT:  Hematemesis/hypotension  INITIAL PRESENTATION: 59 yo female presented with encephalopathy, frequent stools, weakenss/fatigue.  She has hx of cholangiocarcinoma with portal HTN and ascites s/p resection March 2016, and s/p chemo-radiation at [facility].  Developed hematemesis and transferred to ICU, shock.  STUDIES:  7/12 Rt upper quadrant u/s >> complex cysts in liver 7/13 CT abd/pelvis>>> b/l effusions with ATX, 3 cystic lesions in Rt liver ?abscess, mild splenomegaly, moderate ascites, diffuse gastric/SB/colon wall thickening 7/13 EGD >> small gastric ulcer, no active bleeding, non bleeding esophageal varices, antral gastritis and duodenitis likely from radiation  SIGNIFICANT EVENTS: 7/12 Admitted with GI bleed/hematemesis, worsening encephalopathy 7/13 transfuse 2 units PRBC, EGD, pressors, VDRF 7/14 Off pressors, extubated 7/16 IR drainage of 2 liver abscesses w/ drains in place  SUBJECTIVE: No distress. Patient reports feeling confused. Does have some abdominal discomfort. Denies nausea. Tolerating clear liquids & is hungry.  VITAL SIGNS: Temp:  [97.3 F (36.3 C)-99.3 F (37.4 C)] 98.1 F (36.7 C) (07/17 0700) Pulse Rate:  [74-93] 74 (07/16 1220) Resp:  [13-23] 16 (07/17 0700) BP: (105-131)/(61-80) 116/68 mmHg (07/17 0700) SpO2:  [90 %-98 %] 97 % (07/17 0700) Weight:  [139 lb 12.4 oz (63.4 kg)] 139 lb 12.4 oz (63.4 kg) (07/17 0559) INTAKE / OUTPUT:  Intake/Output Summary (Last 24 hours) at 09/07/14 0739 Last data filed at 09/07/14 0521  Gross per 24 hour  Intake   2709 ml  Output    700 ml  Net   2009 ml    PHYSICAL EXAMINATION: General:  Awake. Thin female. No acute distress.  Integument:  Warm & dry. No rash on exposed skin. HEENT:  Tacky mucus membranes. No oral  ulcers. PERRL. Cardiovascular:  Regular rate. No edema. No appreciable JVD.  Pulmonary:  Slightly decreased breath sounds bilateral bases. Symmetric chest wall expansion. No accessory muscle use. Abdomen: Soft. Normal bowel sounds. Drains in place in right upper quadrant. Neurological:  CN 2-12 grossly in tact. No asterixis. No meningismus. Moving all 4 extremities equally.    LABS:  CBC  Recent Labs Lab 09/05/14 0400 09/06/14 0427 09/07/14 0600  WBC 13.5* 9.8 9.3  HGB 7.5* 8.1* 7.9*  HCT 21.9* 23.8* 23.8*  PLT 37* 48* 94*   Coag's  Recent Labs Lab 09/04/14 0150 09/05/14 0400 09/06/14 0427  APTT  --  34 31  INR 1.57* 1.30 1.33   BMET  Recent Labs Lab 09/05/14 0400 09/06/14 0427 09/07/14 0600  NA 135 136 137  K 3.4* 3.0* 3.7  CL 108 111 113*  CO2 21* 21* 19*  BUN 81* 65* 56*  CREATININE 1.61* 1.32* 1.03*  GLUCOSE 174* 112* 105*   Electrolytes  Recent Labs Lab 09/03/14 0645  09/04/14 0150 09/05/14 0400 09/06/14 0427 09/07/14 0600  CALCIUM  --   < > 7.4* 7.5* 7.4* 7.4*  MG 1.5*  --  3.1* 3.0*  --   --   PHOS 2.7  --  4.6 4.3  --   --   < > = values in this interval not displayed.   Sepsis Markers  Recent Labs Lab 09/02/14 2100  09/03/14 1110 09/04/14 0850 09/05/14 0430  LATICACIDVEN  --   < > 5.9* 5.0* 2.9*  PROCALCITON 45.91  --   --   --   --   < > =  values in this interval not displayed.   ABG  Recent Labs Lab 09/02/14 2228 09/03/14 1112  PHART 7.581* 7.474*  PCO2ART 16.6* 28.0*  PO2ART 92.8 171*   Liver Enzymes  Recent Labs Lab 09/02/14 1231 09/05/14 0400 09/06/14 0427  AST 353* 158* 189*  ALT 139* 87* 97*  ALKPHOS 707* 586* 778*  BILITOT 1.6* 2.3* 2.6*  ALBUMIN 1.8* 1.6* 1.6*   Cardiac Enzymes  Recent Labs Lab 09/02/14 2340  TROPONINI 0.05*   Glucose  Recent Labs Lab 09/06/14 0443 09/06/14 0758 09/06/14 1608 09/06/14 2041 09/07/14 0046 09/07/14 0429  GLUCAP 100* 82 71 112* 125* 114*    Imaging No  results found.   ASSESSMENT / PLAN:  PULMONARY ETT 7/13 >> 7/14 A: Atelectasis.  P:   Pulmonary toilet Mobilize as tolerated Supplemental oxygen to maintain saturation greater than 92%  CARDIOVASCULAR Right Chest Port >> Lt IJ CVL 7/13 >> 7/15 A:  Septic/hemorrhagic shock >> off pressors 7/14.  P:  Goal even fluid balance Continue to monitor on telemetry  RENAL A:   AKI >> resolved Metabolic acidosis >> lactic acidosis, and non gap acidosis >> resolved. Hyperkalemia >> resolved. Hypokalemia >> resolved Hypomagnesemia >> resolved.  P:   Monitor urine output Continue to trend daily BUN/creatinine & electrolytes  GASTROINTESTINAL A:   Upper GI bleed 2nd to PUD, gastritis/duodenitis. Hx of cholangiocarcinoma with portal HTN, ascites, splenomegaly, esophageal varices. Liver abcesses >> IR Drains x2 7/16  P:   IR drains in place for abscesses Protonix IV q12hr Continue lactulose, rifaximin Advance diet as tolerated Hold outpt lasix, corgard, aldactone for now  HEMATOLOGIC A:   Acute blood loss anemia 2nd to Upper GI bleed. Thrombocytopenia, coagulopathy in setting of liver disease.  P:  Trending daily CBC & coags daily Transfuse for Hb < 7 or bleeding Status post platelet transfusion for IR procedure SCD's  INFECTIOUS A:   Septic shock with Klebsiella bacteremia >> likely intra-abdominal source >> Procalcitonin 45.91 from 7/12. Rt lung infiltrate on CXR >> initial concern for aspiration, but most likely atelectasis. DRESS syndrome from PCN and cephalosporins.  P:   Continuing imipenem while awaiting results from recent cultures  primaxin 7/12 >>   BCx2 7/12 >> Klebsiella species  Sputum 7/14 >> Abscess Cultures 7/16 >>  ENDOCRINE A:   Hypoglycemia > improved.  P:   Monitor CBG's q6hr Continue Dextrose in IV fluids until she has substantial eating  NEUROLOGIC A:   Acute metabolic/sepsis encephalopathy >> improving. Deconditioning.  P:    Monitor mental status PT Continuing lactulose & rifaximin   Patient remains critically from her multiple medical problems and organ failure. We will continue to monitor her in the intensive care unit given her tenuous status and high potential for clinical decompensation. I have spent a total of 35 minutes of critical care time today caring for the patient and reviewing the patient's electronic medical record.   Sonia Baller Ashok Cordia, M.D. [facility] Pulmonary & Critical Care Pager:  [phone] After 3pm or if no response, call [phone]   09/07/2014, 7:39 AM

## 2014-09-07 NOTE — Progress Notes (Signed)
Referring Physician(s): CCM  Subjective:  Hepatic abscess 2 drains placed in Rad 7/16 Pt without complaints   Allergies: Rocephin; Vancomycin; Zosyn; Amoxicillin; and Sulfa antibiotics  Medications: Prior to Admission medications   Medication Sig Start Date End Date Taking? Authorizing Provider  ciprofloxacin (CIPRO) 500 MG tablet Take 1 tablet (500 mg total) by mouth daily with breakfast. 02/12/14  Yes Robbie Lis, MD  furosemide (LASIX) 40 MG tablet Take 40 mg by mouth daily. 08/04/14  Yes Historical Provider, MD  lactulose (CHRONULAC) 10 GM/15ML solution Take 45 mLs (30 g total) by mouth 2 (two) times daily. Patient taking differently: Take 30 g by mouth 3 (three) times daily.  04/04/14  Yes Bonnielee Haff, MD  metoCLOPramide (REGLAN) 5 MG tablet Take 5 mg by mouth 4 (four) times daily as needed for nausea (nausea).   Yes Historical Provider, MD  nadolol (CORGARD) 20 MG tablet Take 10 mg by mouth daily.   Yes Historical Provider, MD  ondansetron (ZOFRAN-ODT) 4 MG disintegrating tablet Take 4 mg by mouth at bedtime as needed for nausea.  06/10/14  Yes Historical Provider, MD  pantoprazole (PROTONIX) 40 MG tablet Take 1 tablet (40 mg total) by mouth every 12 (twelve) hours. 02/12/14  Yes Robbie Lis, MD  rifaximin (XIFAXAN) 550 MG TABS tablet Take 1 tablet (550 mg total) by mouth 2 (two) times daily. 02/12/14  Yes Robbie Lis, MD  spironolactone (ALDACTONE) 50 MG tablet Take 1 tablet (50 mg total) by mouth daily. 02/12/14 02/12/15 Yes Robbie Lis, MD  sucralfate (CARAFATE) 1 GM/10ML suspension Take 10 mLs (1 g total) by mouth 3 (three) times daily with meals. Patient not taking: Reported on 09/02/2014 02/12/14   Robbie Lis, MD     Vital Signs: BP 116/68 mmHg  Pulse 74  Temp(Src) 98.1 F (36.7 C) (Core (Comment))  Resp 16  Ht 5\' 6"  (1.676 m)  Wt 139 lb 12.4 oz (63.4 kg)  BMI 22.57 kg/m2  SpO2 97%  Physical Exam  Abdominal: She exhibits distension. There is no  tenderness.  Hepatic abscess drains intact Superior drain output bilious/bloody 40 cc in bag  Inferior drain more bloody 10 cc in bag  Sites clean and dry NT No bleeding  afeb Wbc wnl    Nursing note and vitals reviewed.   Imaging: Ct Abdomen Pelvis Wo Contrast  09/03/2014   CLINICAL DATA:  Right upper quadrant pain for several days. Diarrhea. Hematemesis. Surgical resection of cholangiocarcinoma approximately 4 months ago.  EXAM: CT ABDOMEN AND PELVIS WITHOUT CONTRAST  TECHNIQUE: Multidetector CT imaging of the abdomen and pelvis was performed following the standard protocol without IV contrast.  COMPARISON:  Ultrasound on 09/02/2014 and MRI on 03/23/2013  FINDINGS: Lower chest: Small bilateral pleural effusions and bibasilar atelectasis noted. Small hiatal hernia also demonstrated.  Hepatobiliary: Surgical clips are seen from previous left hepatectomy. 2 cystic lesions containing air-fluid levels are seen in the dome of the right hepatic lobe which measure approximately 3.5 and 5.0 cm. A third low-attenuation lesion containing small amount of internal areas seen in the inferior right hepatic lobe which measures 2.4 cm. These are suspicious for hepatic abscesses. Mild pneumobilia also noted.  Pancreas: No mass or inflammatory process visualized on this unenhanced exam.  Spleen: Mild splenomegaly, with spleen measuring approximately 13 cm in length.  Adrenal Glands:  No masses identified.  Kidneys/Urinary tract: No evidence of urolithiasis or hydronephrosis. Foley catheter seen within the bladder.  Stomach/Bowel/Peritoneum: Moderate ascites is  seen within the abdomen and pelvis. Diffuse gastric, small bowel and colonic wall thickening is seen. No evidence of bowel obstruction.  Vascular/Lymphatic: No pathologically enlarged lymph nodes identified. Venous collaterals are seen in the gastrosplenic ligament, consistent with portal venous hypertension.  Reproductive:  No mass or other significant  abnormality noted.  Other:  None.  Musculoskeletal:  No suspicious bone lesions identified.  IMPRESSION: Three cystic lesions of the right hepatic lobe measuring up to 5 cm which contain internal gas, and are suspicious for hepatic abscesses.  Diffuse gastric, small bowel, and colonic wall thickening. Differential diagnosis includes hypoalbuminemia and infectious or inflammatory etiologies.  Moderate ascites, small bilateral pleural effusions, and bibasilar atelectasis.  Splenomegaly and gastrohepatic ligament venous collaterals, consistent with portal venous hypertension.   Electronically Signed   By: Earle Gell M.D.   On: 09/03/2014 14:05   Dg Chest Port 1 View  09/05/2014   CLINICAL DATA:  Respiratory failure.  Endotracheal tube removed  EXAM: PORTABLE CHEST - 1 VIEW  COMPARISON:  September 04, 2014  FINDINGS: Endotracheal tube and nasogastric tube have been removed. Left jugular catheter tip is in the superior vena cava. Port-A-Cath tip is in the superior cava near the cavoatrial junction. No pneumothorax. There is stable underlying interstitial edema. The previously and right lower lobe consolidation has essentially completely resolved. No new opacity. Heart size and pulmonary vascularity are normal. No adenopathy.  IMPRESSION: Stable underlying interstitial edema. No airspace consolidation. Catheter positions as described without pneumothorax.   Electronically Signed   By: Lowella Grip III M.D.   On: 09/05/2014 07:10   Dg Chest Port 1 View  09/04/2014   CLINICAL DATA:  Intubation.  EXAM: PORTABLE CHEST - 1 VIEW  COMPARISON:  None.  FINDINGS: Endotracheal tube, left IJ line, and NG tube in good anatomic position. Power port catheter in good anatomic position. Mediastinum hilar structures normal. Heart size normal. Partial clearing of right infrahilar/right lower lower lobe infiltrate. No pleural effusion or pneumothorax.  IMPRESSION: 1. Lines and tubes in stable position. 2. Partial clearing of right  infrahilar/right lower lobe infiltrate.   Electronically Signed   By: Marcello Moores  Register   On: 09/04/2014 07:08    Labs:  CBC:  Recent Labs  09/04/14 0150 09/05/14 0400 09/06/14 0427 09/07/14 0600  WBC 23.7* 13.5* 9.8 9.3  HGB 7.9* 7.5* 8.1* 7.9*  HCT 22.5* 21.9* 23.8* 23.8*  PLT 41* 37* 48* 94*    COAGS:  Recent Labs  02/06/14 1444  09/03/14 1110 09/04/14 0150 09/05/14 0400 09/06/14 0427  INR 1.04  < > 1.58* 1.57* 1.30 1.33  APTT 21*  --   --   --  34 31  < > = values in this interval not displayed.  BMP:  Recent Labs  09/04/14 0150 09/05/14 0400 09/06/14 0427 09/07/14 0600  NA 133* 135 136 137  K 3.7 3.4* 3.0* 3.7  CL 104 108 111 113*  CO2 19* 21* 21* 19*  GLUCOSE 177* 174* 112* 105*  BUN 81* 81* 65* 56*  CALCIUM 7.4* 7.5* 7.4* 7.4*  CREATININE 1.77* 1.61* 1.32* 1.03*  GFRNONAA 30* 33* 43* 57*  GFRAA 35* 39* 49* >60    LIVER FUNCTION TESTS:  Recent Labs  06/07/14 1351 09/02/14 1231 09/05/14 0400 09/06/14 0427  BILITOT 1.2 1.6* 2.3* 2.6*  AST 131* 353* 158* 189*  ALT 62* 139* 87* 97*  ALKPHOS 1219* 707* 586* 778*  PROT 5.5* 6.1* 4.9* 5.0*  ALBUMIN 1.5* 1.8* 1.6* 1.6*  Assessment and Plan:  Hepatic abscess drains in place Restful Comfortable Will follow   Signed: Nathaniel Yaden A 09/07/2014, 11:19 AM   I spent a total of 15 Minutes in face to face in clinical consultation/evaluation, greater than 50% of which was counseling/coordinating care for hepatic abscess drains

## 2014-09-08 DIAGNOSIS — L0291 Cutaneous abscess, unspecified: Secondary | ICD-10-CM

## 2014-09-08 DIAGNOSIS — R29898 Other symptoms and signs involving the musculoskeletal system: Secondary | ICD-10-CM

## 2014-09-08 DIAGNOSIS — N170 Acute kidney failure with tubular necrosis: Secondary | ICD-10-CM

## 2014-09-08 LAB — HEPATIC FUNCTION PANEL
ALK PHOS: 663 U/L — AB (ref 38–126)
ALT: 72 U/L — ABNORMAL HIGH (ref 14–54)
AST: 123 U/L — ABNORMAL HIGH (ref 15–41)
Albumin: 1.5 g/dL — ABNORMAL LOW (ref 3.5–5.0)
BILIRUBIN TOTAL: 2.6 mg/dL — AB (ref 0.3–1.2)
Bilirubin, Direct: 1.4 mg/dL — ABNORMAL HIGH (ref 0.1–0.5)
Indirect Bilirubin: 1.2 mg/dL — ABNORMAL HIGH (ref 0.3–0.9)
Total Protein: 4.8 g/dL — ABNORMAL LOW (ref 6.5–8.1)

## 2014-09-08 LAB — CBC WITH DIFFERENTIAL/PLATELET
Basophils Absolute: 0 10*3/uL (ref 0.0–0.1)
Basophils Relative: 0 % (ref 0–1)
Eosinophils Absolute: 0.4 10*3/uL (ref 0.0–0.7)
Eosinophils Relative: 5 % (ref 0–5)
HCT: 27.4 % — ABNORMAL LOW (ref 36.0–46.0)
HEMOGLOBIN: 8.7 g/dL — AB (ref 12.0–15.0)
LYMPHS PCT: 8 % — AB (ref 12–46)
Lymphs Abs: 0.7 10*3/uL (ref 0.7–4.0)
MCH: 26.9 pg (ref 26.0–34.0)
MCHC: 31.8 g/dL (ref 30.0–36.0)
MCV: 84.8 fL (ref 78.0–100.0)
Monocytes Absolute: 1.2 10*3/uL — ABNORMAL HIGH (ref 0.1–1.0)
Monocytes Relative: 13 % — ABNORMAL HIGH (ref 3–12)
Neutro Abs: 7 10*3/uL (ref 1.7–7.7)
Neutrophils Relative %: 75 % (ref 43–77)
Platelets: 71 10*3/uL — ABNORMAL LOW (ref 150–400)
RBC: 3.23 MIL/uL — ABNORMAL LOW (ref 3.87–5.11)
RDW: 17.2 % — ABNORMAL HIGH (ref 11.5–15.5)
WBC: 9.3 10*3/uL (ref 4.0–10.5)

## 2014-09-08 LAB — RENAL FUNCTION PANEL
ALBUMIN: 1.5 g/dL — AB (ref 3.5–5.0)
Anion gap: 4 — ABNORMAL LOW (ref 5–15)
BUN: 41 mg/dL — AB (ref 6–20)
CALCIUM: 7.4 mg/dL — AB (ref 8.9–10.3)
CHLORIDE: 116 mmol/L — AB (ref 101–111)
CO2: 20 mmol/L — ABNORMAL LOW (ref 22–32)
Creatinine, Ser: 0.96 mg/dL (ref 0.44–1.00)
Glucose, Bld: 107 mg/dL — ABNORMAL HIGH (ref 65–99)
Phosphorus: 2.7 mg/dL (ref 2.5–4.6)
Potassium: 3.5 mmol/L (ref 3.5–5.1)
SODIUM: 140 mmol/L (ref 135–145)

## 2014-09-08 LAB — PROTIME-INR
INR: 1.46 (ref 0.00–1.49)
Prothrombin Time: 17.8 seconds — ABNORMAL HIGH (ref 11.6–15.2)

## 2014-09-08 LAB — GLUCOSE, CAPILLARY
Glucose-Capillary: 110 mg/dL — ABNORMAL HIGH (ref 65–99)
Glucose-Capillary: 92 mg/dL (ref 65–99)
Glucose-Capillary: 141 mg/dL — ABNORMAL HIGH (ref 65–99)
Glucose-Capillary: 128 mg/dL — ABNORMAL HIGH (ref 65–99)

## 2014-09-08 LAB — MAGNESIUM: Magnesium: 2.4 mg/dL (ref 1.7–2.4)

## 2014-09-08 LAB — APTT: APTT: 36 s (ref 24–37)

## 2014-09-08 MED ORDER — PANTOPRAZOLE SODIUM 40 MG PO TBEC
40.0000 mg | DELAYED_RELEASE_TABLET | Freq: Two times a day (BID) | ORAL | Status: DC
  Administered 2014-09-08 – 2014-09-11 (×6): 40 mg via ORAL
  Filled 2014-09-08 (×6): qty 1

## 2014-09-08 MED ORDER — BOOST / RESOURCE BREEZE PO LIQD
1.0000 | Freq: Three times a day (TID) | ORAL | Status: DC
  Administered 2014-09-08: 1 via ORAL

## 2014-09-08 MED ORDER — BOOST PLUS PO LIQD
237.0000 mL | Freq: Two times a day (BID) | ORAL | Status: DC
  Administered 2014-09-09 – 2014-09-11 (×3): 237 mL via ORAL
  Filled 2014-09-08 (×7): qty 237

## 2014-09-08 MED ORDER — SODIUM CHLORIDE 0.9 % IV SOLN
500.0000 mg | Freq: Three times a day (TID) | INTRAVENOUS | Status: DC
  Administered 2014-09-08 – 2014-09-09 (×3): 500 mg via INTRAVENOUS
  Filled 2014-09-08 (×6): qty 500

## 2014-09-08 MED ORDER — SPIRONOLACTONE 25 MG PO TABS
25.0000 mg | ORAL_TABLET | Freq: Every day | ORAL | Status: DC
  Administered 2014-09-08 – 2014-09-09 (×2): 25 mg via ORAL
  Filled 2014-09-08 (×2): qty 1

## 2014-09-08 MED ORDER — FUROSEMIDE 40 MG PO TABS
40.0000 mg | ORAL_TABLET | Freq: Every day | ORAL | Status: DC
  Administered 2014-09-08 – 2014-09-09 (×2): 40 mg via ORAL
  Filled 2014-09-08 (×2): qty 1

## 2014-09-08 MED ORDER — FENTANYL CITRATE (PF) 100 MCG/2ML IJ SOLN
25.0000 ug | INTRAMUSCULAR | Status: DC | PRN
  Administered 2014-09-08: 25 ug via INTRAVENOUS
  Administered 2014-09-08: 50 ug via INTRAVENOUS
  Administered 2014-09-08 – 2014-09-09 (×2): 25 ug via INTRAVENOUS
  Filled 2014-09-08 (×4): qty 2

## 2014-09-08 NOTE — Care Management Note (Signed)
Case Management Note  Patient Details  Name: Joann Castaneda MRN: [id number] Date of Birth: 09/29/1955  Subjective/Objective:            encephalopathy        Action/Plan:   Expected Discharge Date:   (unknown)               Expected Discharge Plan:  Home/Self Care  In-House Referral:  NA  Discharge planning Services  CM Consult  Post Acute Care Choice:  NA Choice offered to:  NA  DME Arranged:  N/A DME Agency:  NA  HH Arranged:  NA HH Agency:  NA  Status of Service:     Medicare Important Message Given:    Date Medicare IM Given:    Medicare IM give by:    Date Additional Medicare IM Given:    Additional Medicare Important Message give by:     If discussed at [place] of Stay Meetings, dates discussed:    Additional Comments:  [name], RN 09/08/2014, 10:09 AM

## 2014-09-08 NOTE — Progress Notes (Signed)
ANTIBIOTIC CONSULT NOTE - FOLLOW UP  Pharmacy Consult for Antibiotic renal dose adjustment (Imipenem) Indication: Klebsiella Bacteremia  Allergies  Allergen Reactions  . Rocephin [Ceftriaxone] Other (See Comments)    Dress syndrome  . Vancomycin Other (See Comments)    Dress syndrome.   Marland Kitchen Zosyn [Piperacillin Sod-Tazobactam So]     Dress syndrome  . Amoxicillin Rash  . Sulfa Antibiotics Rash    Patient Measurements: Height: 5\' 6"  (167.6 cm) Weight: 139 lb 12.4 oz (63.4 kg) IBW/kg (Calculated) : 59.3  Vital Signs: Temp: 98.4 F (36.9 C) (07/18 0600) Temp Source: Core (Comment) (07/18 0400) BP: 130/78 mmHg (07/18 0600) Intake/Output from previous day: 07/17 0701 - 07/18 0700 In: 2740 [P.O.:1170; I.V.:1150; IV Piggyback:400] Out: 1075 [Urine:985; Drains:90]  Labs:  Recent Labs  09/06/14 0427 09/07/14 0600 09/08/14 0420  WBC 9.8 9.3 9.3  HGB 8.1* 7.9* 8.7*  PLT 48* 94* 71*  CREATININE 1.32* 1.03* 0.96   Estimated Creatinine Clearance: 57.6 mL/min (by C-G formula based on Cr of 0.96). No results for input(s): VANCOTROUGH, VANCOPEAK, VANCORANDOM, GENTTROUGH, GENTPEAK, GENTRANDOM, TOBRATROUGH, TOBRAPEAK, TOBRARND, AMIKACINPEAK, AMIKACINTROU, AMIKACIN in the last 72 hours.    Assessment: 68 yoF admitted 7/12 with worsening encephalopathy and hematemesis.  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 Chronic Cipro PTA. He was started on linezolid and imipenem for possible intra-abdominal infection +/- UTI, but narrowed to imipenem alone on 7/14.  She has several antibiotics to be avoided d/t h/o DRESS.   Anti-infectives: PTA >> Cipro >> 7/12 7/12 >> Linezolid >> 7/14 7/12 >> Primaxin >>  Microbiology: 7/12 blood x2: Klebsiella (sens: A/S, cefaz, cefepime, ceftaz, CTX, imi, P/T, T/S) 7/12 urine: NGF 7/14 sputum: moderate yeast, candida 7/16 Peritoneal fluid: 7/16 liver abscess: ngtd  Today,  09/08/2014:  Day # 7 Primaxin  Tm 99.3  WBC remain WNL  SCr improved to 0.96 with CrCl ~ 57 ml/min  Goal of Therapy:  Appropriate abx dosing, eradication of infection.   Plan:   Imipenem 500mg  IV q8h  Follow up renal fxn, culture results, and clinical course.  Gretta Arab PharmD, BCPS Pager [phone] 09/08/2014 9:23 AM

## 2014-09-08 NOTE — Progress Notes (Signed)
Referring Physician(s): CCM  Subjective:  Hepatic abscess 2 drains placed in Rad 7/16 Feeling better   Allergies: Rocephin; Vancomycin; Zosyn; Amoxicillin; and Sulfa antibiotics  Medications:  Current facility-administered medications:  .  0.9 %  sodium chloride infusion, 250 mL, Intravenous, PRN, Erick Colace, NP, Last Rate: 10 mL/hr at 09/04/14 1900, 250 mL at 09/04/14 1900 .  fentaNYL (SUBLIMAZE) injection 25-50 mcg, 25-50 mcg, Intravenous, Q2H PRN, Juanito Doom, MD, 25 mcg at 09/08/14 1339 .  furosemide (LASIX) tablet 40 mg, 40 mg, Oral, Daily, Donita Brooks, NP, 40 mg at 09/08/14 1133 .  imipenem-cilastatin (PRIMAXIN) 500 mg in sodium chloride 0.9 % 100 mL IVPB, 500 mg, Intravenous, Q8H, Christine E Shade, RPH, 500 mg at 09/08/14 1256 .  lactose free nutrition (BOOST PLUS) liquid 237 mL, 237 mL, Oral, BID BM, Clayton Bibles, RD .  lactulose (CHRONULAC) 10 GM/15ML solution 30 g, 30 g, Oral, BID, [name], MD, 30 g at 09/08/14 0938 .  ondansetron (ZOFRAN) injection 4 mg, 4 mg, Intravenous, Q6H PRN, Erick Colace, NP, 4 mg at 09/08/14 1320 .  pantoprazole (PROTONIX) EC tablet 40 mg, 40 mg, Oral, BID, Ronald Lobo, MD .  rifaximin Doreene Nest) tablet 550 mg, 550 mg, Oral, BID, [name], MD, 550 mg at 09/08/14 0933 .  spironolactone (ALDACTONE) tablet 25 mg, 25 mg, Oral, Daily, Juanito Doom, MD, 25 mg at 09/08/14 1255 .  sucralfate (CARAFATE) 1 GM/10ML suspension 1 g, 1 g, Oral, TID WC & HS, [name], MD, 1 g at 09/08/14 1225    Vital Signs: BP 124/73 mmHg  Pulse 74  Temp(Src) 97.8 F (36.6 C) (Oral)  Resp 18  Ht 5\' 6"  (1.676 m)  Wt 139 lb 12.4 oz (63.4 kg)  BMI 22.57 kg/m2  SpO2 98%  Physical Exam  Constitutional: She is oriented to person, place, and time. No distress.  Abdominal: Soft. There is no tenderness.  Hepatic abscess drains intact, sites clean Superior drain output bilious/bloody 30cc output  Inferior drain more bloody 20cc  output  Neurological: She is alert and oriented to person, place, and time.    Imaging: Dg Chest Port 1 View  09/05/2014   CLINICAL DATA:  Respiratory failure.  Endotracheal tube removed  EXAM: PORTABLE CHEST - 1 VIEW  COMPARISON:  September 04, 2014  FINDINGS: Endotracheal tube and nasogastric tube have been removed. Left jugular catheter tip is in the superior vena cava. Port-A-Cath tip is in the superior cava near the cavoatrial junction. No pneumothorax. There is stable underlying interstitial edema. The previously and right lower lobe consolidation has essentially completely resolved. No new opacity. Heart size and pulmonary vascularity are normal. No adenopathy.  IMPRESSION: Stable underlying interstitial edema. No airspace consolidation. Catheter positions as described without pneumothorax.   Electronically Signed   By: Lowella Grip III M.D.   On: 09/05/2014 07:10    Labs:  CBC:  Recent Labs  09/05/14 0400 09/06/14 0427 09/07/14 0600 09/08/14 0420  WBC 13.5* 9.8 9.3 9.3  HGB 7.5* 8.1* 7.9* 8.7*  HCT 21.9* 23.8* 23.8* 27.4*  PLT 37* 48* 94* 71*    COAGS:  Recent Labs  02/06/14 1444  09/04/14 0150 09/05/14 0400 09/06/14 0427 09/08/14 0420  INR 1.04  < > 1.57* 1.30 1.33 1.46  APTT 21*  --   --  34 31 36  < > = values in this interval not displayed.  BMP:  Recent Labs  09/05/14 0400 09/06/14 0427 09/07/14 0600  09/08/14 0420  NA 135 136 137 140  K 3.4* 3.0* 3.7 3.5  CL 108 111 113* 116*  CO2 21* 21* 19* 20*  GLUCOSE 174* 112* 105* 107*  BUN 81* 65* 56* 41*  CALCIUM 7.5* 7.4* 7.4* 7.4*  CREATININE 1.61* 1.32* 1.03* 0.96  GFRNONAA 33* 43* 57* >60  GFRAA 39* 49* >60 >60    LIVER FUNCTION TESTS:  Recent Labs  09/02/14 1231 09/05/14 0400 09/06/14 0427 09/08/14 0420  BILITOT 1.6* 2.3* 2.6* 2.6*  AST 353* 158* 189* 123*  ALT 139* 87* 97* 72*  ALKPHOS 707* 586* 778* 663*  PROT 6.1* 4.9* 5.0* 4.8*  ALBUMIN 1.8* 1.6* 1.6* 1.5*  1.5*    Assessment and  Plan:  Hepatic abscess drains in place Comfortable Will follow   Signed: Ascencion Dike 09/08/2014, 2:24 PM   I spent a total of 15 Minutes in face to face in clinical consultation/evaluation, greater than 50% of which was counseling/coordinating care for hepatic abscess drains

## 2014-09-08 NOTE — Progress Notes (Addendum)
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 She was admitted to [facility] 7/12 after progressive worsening of encephalopathy despite continued lactulose and rifaximin. No sig stools for several days, then from 7/10 to 7/12 had been stooling constantly. Her weakness and fatigue continued to worsen, then had episode of hematemesis 7/12 which further prompted admission. One more recurrence of hematemesis on arrival to ED. On presentation she was hypotensive (in 90s) w/ LA of 10. Her initial Hgb was 10 but dropped to 7.5 in the ER. Admitted to the CCM service w/ working dx of possible dehydration, UGIB and possible sepsis    PT Comments    Patient is much stronger today, ambulated x 100' but slowly and with 2 person assist for safety. Recommend OT consult.   Follow Up Recommendations  SNF;Home health PT (depends on progress, )     Equipment Recommendations  3in1 (PT)    Recommendations for Other Services       Precautions / Restrictions Precautions Precautions: Fall Precaution Comments: @ drains on R, portacath    Mobility  Bed Mobility               General bed mobility comments: in recliner  Transfers Overall transfer level: Needs assistance Equipment used: Rolling walker (2 wheeled) Transfers: Sit to/from [facility] Sit to Stand: Mod assist;+2 physical assistance;+2 safety/equipment Stand pivot transfers: Mod assist;+2 safety/equipment;+2 physical assistance       General transfer comment: extra time for Castaneda, steady assist   to power up first trial, able to push self to stand second trial(from recliner)  Ambulation/Gait Ambulation/Gait assistance: Mod assist;+2 physical assistance;+2  safety/equipment Ambulation Distance (Feet): 100 Feet Assistive device: Rolling walker (2 wheeled) Gait Pattern/deviations: Step-to pattern;Decreased stride length;Shuffle;Wide base of support Gait velocity: slow   General Gait Details: cues for rest breaks as needed,    Stairs            Wheelchair Mobility    Modified Rankin (Stroke Patients Only)       Balance                                    Cognition Arousal/Alertness: Awake/alert Behavior During Therapy: WFL for tasks assessed/performed Overall Cognitive Status: Impaired/Different from baseline Area of Impairment: Orientation;Memory;Safety/judgement;Awareness                    Exercises General Exercises - Lower Extremity Ankle Circles/Pumps: AROM;5 reps;Seated;Both Long Arc Quad: AROM;Both;5 reps;Seated Hip ABduction/ADduction: AROM;Both;5 reps;Seated    General Comments        Pertinent Vitals/Pain Pain Assessment: Faces Faces Pain Scale: Hurts little more Pain Location: abdomen, Pain Descriptors / Indicators: Cramping Pain Intervention(s): Limited activity within patient's tolerance;Monitored during session;Repositioned    Home Living                      Prior Function            PT Goals (current goals can now be found in the care plan section) Progress towards PT goals: Progressing toward goals    Frequency  Min 3X/week    PT Plan Current plan remains appropriate    Co-evaluation  End of Session Equipment Utilized During Treatment: Gait belt Activity Tolerance: Patient tolerated treatment well Patient left: in chair;with call bell/phone within reach;with nursing/sitter in room     Time: 5462-7035 PT Time Calculation (min) (ACUTE ONLY): 28 min  Charges:  $Gait Training: 23-37 mins                    G Codes:      Marcelino Freestone PT [phone]  09/08/2014, 10:04 AM

## 2014-09-08 NOTE — Progress Notes (Signed)
Date:  September 08, 2014 U.R. performed for needs and level of care. Will continue to follow for Case Management needs.  Rhonda Davis, RN, BSN, CCM   [phone] 

## 2014-09-08 NOTE — Progress Notes (Signed)
PULMONARY / CRITICAL CARE MEDICINE   Name: Joann Castaneda MRN: [id number] DOB: 1955/07/01    ADMISSION DATE:  09/02/2014 CONSULTATION DATE:  09/02/14  REFERRING MD :  ED physician  CHIEF COMPLAINT:  Hematemesis/hypotension  INITIAL PRESENTATION: 59 yo female presented with encephalopathy, frequent stools, weakenss/fatigue.  She has hx of cholangiocarcinoma with portal HTN and ascites s/p resection March 2016, and s/p chemo-radiation at [facility].  Developed hematemesis and transferred to ICU, shock.  STUDIES:  7/12  Rt upper quadrant u/s >> complex cysts in liver 7/13  CT abd/pelvis>>> b/l effusions with ATX, 3 cystic lesions in Rt liver ?abscess, mild splenomegaly, moderate ascites, diffuse gastric/SB/colon wall thickening 7/13  EGD >> small gastric ulcer, no active bleeding, non bleeding esophageal varices, antral gastritis and duodenitis likely from radiation  SIGNIFICANT EVENTS: 7/12  Admitted with GI bleed/hematemesis, worsening encephalopathy 7/13  transfuse 2 units PRBC, EGD, pressors, VDRF 7/14  Off pressors, extubated 7/16  R drainage of 2 liver abscesses w/ drains in place   SUBJECTIVE:  Pt reports feeling "fuzzy headed" and not like herself.  Concerned over excessive swelling and IVF administration.    VITAL SIGNS: Temp:  [97.5 F (36.4 C)-99.3 F (37.4 C)] 98.4 F (36.9 C) (07/18 0600) Resp:  [12-25] 25 (07/18 0600) BP: (116-149)/(59-82) 130/78 mmHg (07/18 0600) SpO2:  [93 %-99 %] 93 % (07/18 0600)   INTAKE / OUTPUT:  Intake/Output Summary (Last 24 hours) at 09/08/14 0909 Last data filed at 09/08/14 0600  Gross per 24 hour  Intake   2630 ml  Output    975 ml  Net   1655 ml    PHYSICAL EXAMINATION: General:  Awake. Thin female. No acute distress, up in chair.  Integument:  Warm & dry. No rash on exposed skin. HEENT:  Moist mucus membranes. No oral ulcers. PERRL. Cardiovascular:  Regular rate. No appreciable JVD. 1+ BLE pitting edema Pulmonary:  Slightly  decreased breath sounds bilateral bases. Symmetric chest wall expansion. No accessory muscle use. Abdomen: Soft but distended..  Normal bowel sounds. Drains in place in right upper quadrant. Neurological:  CN 2-12 grossly in tact. No asterixis.  Moving all 4 extremities equally.    LABS:  CBC  Recent Labs Lab 09/06/14 0427 09/07/14 0600 09/08/14 0420  WBC 9.8 9.3 9.3  HGB 8.1* 7.9* 8.7*  HCT 23.8* 23.8* 27.4*  PLT 48* 94* 71*   Coag's  Recent Labs Lab 09/05/14 0400 09/06/14 0427 09/08/14 0420  APTT 34 31 36  INR 1.30 1.33 1.46   BMET  Recent Labs Lab 09/06/14 0427 09/07/14 0600 09/08/14 0420  NA 136 137 140  K 3.0* 3.7 3.5  CL 111 113* 116*  CO2 21* 19* 20*  BUN 65* 56* 41*  CREATININE 1.32* 1.03* 0.96  GLUCOSE 112* 105* 107*   Electrolytes  Recent Labs Lab 09/04/14 0150 09/05/14 0400 09/06/14 0427 09/07/14 0600 09/08/14 0420  CALCIUM 7.4* 7.5* 7.4* 7.4* 7.4*  MG 3.1* 3.0*  --   --  2.4  PHOS 4.6 4.3  --   --  2.7    Sepsis Markers  Recent Labs Lab 09/02/14 2100  09/03/14 1110 09/04/14 0850 09/05/14 0430  LATICACIDVEN  --   < > 5.9* 5.0* 2.9*  PROCALCITON 45.91  --   --   --   --   < > = values in this interval not displayed.   ABG  Recent Labs Lab 09/02/14 2228 09/03/14 1112  PHART 7.581* 7.474*  PCO2ART 16.6* 28.0*  PO2ART 92.8 171*   Liver Enzymes  Recent Labs Lab 09/05/14 0400 09/06/14 0427 09/08/14 0420  AST 158* 189* 123*  ALT 87* 97* 72*  ALKPHOS 586* 778* 663*  BILITOT 2.3* 2.6* 2.6*  ALBUMIN 1.6* 1.6* 1.5*  1.5*   Cardiac Enzymes  Recent Labs Lab 09/02/14 2340  TROPONINI 0.05*   Glucose  Recent Labs Lab 09/07/14 1134 09/07/14 1601 09/07/14 2006 09/07/14 2325 09/08/14 0420 09/08/14 0827  GLUCAP 164* 151* 133* 128* 110* 92    Imaging No results found.   ASSESSMENT / PLAN:  PULMONARY ETT 7/13 >> 7/14 A: Atelectasis. Mild Pulmonary Edema  P:   Pulmonary toilet Mobilize as  tolerated Supplemental oxygen to maintain saturation greater than 92%  CARDIOVASCULAR Right Chest Port >> Lt IJ CVL 7/13 >> 7/15 A:  Septic/hemorrhagic shock - off pressors 7/14. P:  Goal even fluid balance Continue to monitor on telemetry  RENAL A:   AKI - resolved Metabolic acidosis - lactic acidosis, and non gap acidosis >> resolved. Hyperkalemia - resolved. Hypokalemia - resolved Hypomagnesemia - resolved. P:   Monitor urine output / BMP Replace electrolytes as indicated   GASTROINTESTINAL A:   Upper GI bleed 2nd to PUD, gastritis/duodenitis. Hx of cholangiocarcinoma with portal HTN, ascites, splenomegaly, esophageal varices. Liver abcesses >> IR Drains x2 7/16 Severe Protein Calorie Malnutrition  P:   IR drains in place for abscesses Protonix IV q12hr Continue lactulose, rifaximin Advance diet as tolerated Nutrition consult to maximize PO intake Add Nutritional supplement TID Hold outpt corgard, aldactone for now Resume outpatient lasix, 40 mg QD KVO IVF GI indicates no further interventions at this time  HEMATOLOGIC A:   Acute blood loss anemia 2nd to Upper GI bleed. Thrombocytopenia, coagulopathy in setting of liver disease. P:  Trending daily CBC & coags daily Transfuse for Hb < 7 or bleeding SCD's  INFECTIOUS A:   Septic shock with Klebsiella bacteremia >> likely intra-abdominal source >> Procalcitonin 45.91 from 7/12. Rt lung infiltrate on CXR - initial concern for aspiration, but feel most likely atelectasis. DRESS syndrome from PCN and cephalosporins. P:   Continuing imipenem while awaiting results from recent cultures, D7/x abx  Primaxin 7/12 >>   BCx2 7/12 >> Klebsiella >> sens imipenem  Sputum 7/14 >> moderate yeast >> candida  Sputum 7/14 >> Abscess Cultures 7/16 >> Peritoneal 7/16 >>  ENDOCRINE A:   Hypoglycemia > improved. P:   Monitor CBG's q6hr Continue Dextrose in IV fluids until she has substantial eating  NEUROLOGIC A:    Acute metabolic/sepsis encephalopathy - improving. Deconditioning. P:   Monitor mental status PT Continuing lactulose & rifaximin   GLOBAL:  Transfer to tele floor and TRH as of am 7/19 0700.  Continue PT efforts, push nutrition.    Noe Gens, NP-C [facility] Pgr: [phone] or if no answer [phone] 09/08/2014, 9:09 AM

## 2014-09-08 NOTE — Progress Notes (Signed)
CSW reviewed PT evaluation recommending SNF vs. Home with Home Health at discharge. CSW spoke with patient & husband, Jenny Reichmann at bedside to confirm that they would prefer to return home with home health at discharge, rather than go to SNF. RNCM, Cookie aware.   No further CSW needs identified - CSW signing off.   Raynaldo Opitz, [facility] Clinical Social Worker cell #: [phone]

## 2014-09-08 NOTE — Progress Notes (Signed)
Nutrition Follow-up  DOCUMENTATION CODES:   Non-severe (moderate) malnutrition in context of chronic illness, Underweight  INTERVENTION:   Provide Boost Plus BID, each provides 360 kcal and 14g of protein. Provided diet education. Provide daily snacks Encourage PO intake RD to continue to monitor  NUTRITION DIAGNOSIS:   Inadequate oral intake related to inability to eat as evidenced by NPO status. -Resolved.  Malnutrition related to chronic illness as evidenced by severe depletion of body fat, moderate depletions of muscle mass.   GOAL:   Patient will meet greater than or equal to 90% of their needs  Progressing.  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  Pt extubated 7/14.  Pt now on regular diet, eating ~75%.  Pt followed a NAS diet PTA. Encouraged pt and pt's husband to continue to follow these restrictions. Pt's weight has increased by 26 lb since admission. Encouraged adequate protein consumption, provided good sources of protein. Pt is willing to try Boost supplements. RD to order. Pt would also like yogurt for a daily snack, RD ordered.  Labs reviewed: CBGs: 92-128 Elevated BUN Mg/Phos WNL  Diet Order:  Diet regular Room service appropriate?: Yes; Fluid consistency:: Thin  Skin:  Reviewed, no issues  Last BM:  7/18  Height:   Ht Readings from Last 1 Encounters:  09/02/14 5\' 6"  (1.676 m)    Weight:   Wt Readings from Last 1 Encounters:  09/08/14 139 lb 12.4 oz (63.4 kg)    Ideal Body Weight:  59.1 kg  Wt Readings from Last 10 Encounters:  09/08/14 139 lb 12.4 oz (63.4 kg)  04/02/14 135 lb 2.3 oz (61.3 kg)  02/11/14 133 lb (60.328 kg)  04/03/13 123 lb (55.792 kg)    BMI:  Body  mass index is 22.57 kg/(m^2).  Re-estimated Nutritional Needs:   Kcal:  1600-1800  Protein:  80-90g  Fluid:  1.6L/day  EDUCATION NEEDS:   No education needs identified at this time  Clayton Bibles, MS, RD, LDN Pager: [phone] After Hours Pager: [phone]

## 2014-09-08 NOTE — Progress Notes (Signed)
The patient without evidence of further bleeding.   Hemoglobin stable, stools nonbloody.   Off octreotide (had small esophageal varices on endoscopy 5 days ago), on regular diet. Is also on sucralfate and IV Protonix.  Impression: Quiescent GI bleeding, probably from her gastric ulcer that was clipped at the time of endoscopy. Incidental findings of small esophageal varices and gastritis and duodenitis (possibly radiation related).  Recommendation:  1. We will sign off at this time. Please call if we can be of further assistance 2. I would convert the Protonix to oral therapy at this time (ordered) 3. I would keep the patient on twice daily Protonix for a month to achieve ulcer healing, and would then probably cut back to once daily dosing indefinitely for prophylaxis against recurrent ulceration 4. From the GI bleeding standpoint, the patient probably would not need sucralfate after discharge, but she was on that medication prior to admission (reason unclear, perhaps for bile reflux symptoms) so would certainly be okay to continue it if there is a clear reason for having her on it.  Cleotis Nipper, M.D. Pager [phone] If no answer or after 5 PM call [phone]

## 2014-09-09 DIAGNOSIS — R6521 Severe sepsis with septic shock: Secondary | ICD-10-CM

## 2014-09-09 DIAGNOSIS — K750 Abscess of liver: Secondary | ICD-10-CM

## 2014-09-09 DIAGNOSIS — R7881 Bacteremia: Secondary | ICD-10-CM | POA: Insufficient documentation

## 2014-09-09 DIAGNOSIS — N179 Acute kidney failure, unspecified: Secondary | ICD-10-CM

## 2014-09-09 DIAGNOSIS — A419 Sepsis, unspecified organism: Secondary | ICD-10-CM

## 2014-09-09 LAB — COMPREHENSIVE METABOLIC PANEL
ALT: 60 U/L — AB (ref 14–54)
AST: 102 U/L — ABNORMAL HIGH (ref 15–41)
Albumin: 1.6 g/dL — ABNORMAL LOW (ref 3.5–5.0)
Alkaline Phosphatase: 675 U/L — ABNORMAL HIGH (ref 38–126)
Anion gap: 5 (ref 5–15)
BUN: 29 mg/dL — AB (ref 6–20)
CO2: 20 mmol/L — ABNORMAL LOW (ref 22–32)
CREATININE: 0.95 mg/dL (ref 0.44–1.00)
Calcium: 7.4 mg/dL — ABNORMAL LOW (ref 8.9–10.3)
Chloride: 111 mmol/L (ref 101–111)
GFR calc non Af Amer: >60 mL/min (ref >60)
GLUCOSE: 90 mg/dL (ref 65–99)
POTASSIUM: 3 mmol/L — AB (ref 3.5–5.1)
SODIUM: 136 mmol/L (ref 135–145)
Total Bilirubin: 3 mg/dL — ABNORMAL HIGH (ref 0.3–1.2)
Total Protein: 5 g/dL — ABNORMAL LOW (ref 6.5–8.1)

## 2014-09-09 LAB — CULTURE, ROUTINE-ABSCESS
CULTURE: NO GROWTH
Gram Stain: NONE SEEN

## 2014-09-09 LAB — GLUCOSE, CAPILLARY
GLUCOSE-CAPILLARY: 124 mg/dL — AB (ref 65–99)
Glucose-Capillary: 79 mg/dL (ref 65–99)
Glucose-Capillary: 97 mg/dL (ref 65–99)

## 2014-09-09 LAB — CBC
HCT: 22.1 % — ABNORMAL LOW (ref 36.0–46.0)
Hemoglobin: 7.4 g/dL — ABNORMAL LOW (ref 12.0–15.0)
MCH: 28.6 pg (ref 26.0–34.0)
MCHC: 33.5 g/dL (ref 30.0–36.0)
MCV: 85.3 fL (ref 78.0–100.0)
Platelets: 100 10*3/uL — ABNORMAL LOW (ref 150–400)
RBC: 2.59 MIL/uL — ABNORMAL LOW (ref 3.87–5.11)
RDW: 17.3 % — AB (ref 11.5–15.5)
WBC: 11.2 10*3/uL — AB (ref 4.0–10.5)

## 2014-09-09 LAB — PROTIME-INR
INR: 1.34 (ref 0.00–1.49)
PROTHROMBIN TIME: 16.7 s — AB (ref 11.6–15.2)

## 2014-09-09 MED ORDER — FUROSEMIDE 40 MG PO TABS
40.0000 mg | ORAL_TABLET | Freq: Every day | ORAL | Status: DC
  Administered 2014-09-10 – 2014-09-11 (×2): 40 mg via ORAL
  Filled 2014-09-09 (×2): qty 1

## 2014-09-09 MED ORDER — SODIUM CHLORIDE 0.9 % IV SOLN
1.0000 g | INTRAVENOUS | Status: DC
  Administered 2014-09-09 – 2014-09-11 (×3): 1 g via INTRAVENOUS
  Filled 2014-09-09 (×3): qty 1

## 2014-09-09 MED ORDER — SPIRONOLACTONE 25 MG PO TABS
100.0000 mg | ORAL_TABLET | Freq: Once | ORAL | Status: AC
  Administered 2014-09-09: 100 mg via ORAL
  Filled 2014-09-09: qty 4

## 2014-09-09 MED ORDER — FUROSEMIDE 40 MG PO TABS: 40.0000 mg | ORAL_TABLET | Freq: Two times a day (BID) | ORAL | Status: DC

## 2014-09-09 MED ORDER — NADOLOL 20 MG PO TABS
10.0000 mg | ORAL_TABLET | Freq: Every day | ORAL | Status: DC
  Administered 2014-09-09 – 2014-09-11 (×3): 10 mg via ORAL
  Filled 2014-09-09 (×3): qty 1

## 2014-09-09 MED ORDER — SPIRONOLACTONE 25 MG PO TABS: 100.0000 mg | ORAL_TABLET | Freq: Once | ORAL | Status: DC

## 2014-09-09 MED ORDER — POTASSIUM CHLORIDE CRYS ER 20 MEQ PO TBCR
40.0000 meq | EXTENDED_RELEASE_TABLET | ORAL | Status: AC
  Administered 2014-09-09 (×2): 40 meq via ORAL
  Filled 2014-09-09 (×2): qty 2

## 2014-09-09 MED ORDER — LINEZOLID 600 MG PO TABS
600.0000 mg | ORAL_TABLET | Freq: Two times a day (BID) | ORAL | Status: DC
  Administered 2014-09-09 – 2014-09-11 (×5): 600 mg via ORAL
  Filled 2014-09-09 (×6): qty 1

## 2014-09-09 MED ORDER — FUROSEMIDE 40 MG PO TABS
40.0000 mg | ORAL_TABLET | Freq: Once | ORAL | Status: AC
  Administered 2014-09-09: 40 mg via ORAL
  Filled 2014-09-09: qty 1

## 2014-09-09 MED ORDER — HYDROCODONE-ACETAMINOPHEN 5-325 MG PO TABS
1.0000 | ORAL_TABLET | Freq: Four times a day (QID) | ORAL | Status: DC | PRN
  Administered 2014-09-09 – 2014-09-11 (×6): 1 via ORAL
  Filled 2014-09-09 (×6): qty 1

## 2014-09-09 MED ORDER — SPIRONOLACTONE 25 MG PO TABS
50.0000 mg | ORAL_TABLET | Freq: Every day | ORAL | Status: DC
  Administered 2014-09-10 – 2014-09-11 (×2): 50 mg via ORAL
  Filled 2014-09-09 (×2): qty 2

## 2014-09-09 NOTE — Progress Notes (Signed)
Patient ambulated twice this shift. Last night she was shuffling her feet and was only able to do half the loop. This morning, she was picking up her feet and walked around the whole west side.

## 2014-09-09 NOTE — Progress Notes (Addendum)
[facility] Progress Note   Joann Castaneda WSF:[id number] DOB: 04/23/1955 DOA: 09/02/2014 PCP: Cari Caraway, MD  Brief narrative: Joann Castaneda is a 59 y.o. 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 She had been on chemo (only 2 doses) and radiation from September to October and developed radiation induced gastritis. EGD in April showed grade 1 varices and improvement in duodenitis and gastritis felt second to be prior to previous radiation.  She has portal HTN, ascites, hepatic encephalopathy..   She was admitted to the ICU after an episode of hematemesis on 7/12. She had further hematemesis in the ER and was hypotensive with SBP in 90s, lactic acid was 10, Hb dropped from 10 to 7.5. She was started on Octreotide, Protinx, given Vit K and given 2 UPRBC and intubated for airway protection.   EGD on 7/13 reveals : small gastric ulcer with some stigmata of hemorrhage, no active bleeding no significant blood in the stomach. Small nonbleeding esophageal varices. Antral gastritis and duodenitis possibly from previous radiation.   RUL ultrasound 7/12 revealed complex cysts in the liver CT abd pelvis on 7/13 revealed 3 cysts in the right lobe of liver ? Abscess 7/14- pressors stopped / extubated 7/16 under when IR guided drain placement in liver- 2 drains Blood culture returned positive for Klebsiella- Abscess culture still pending   Subjective: Awake, alert, no c/o nausea, vomiting, abdominal pain or diarrhea- just got up to go to the commode and cannot tell me if she had a BM or just urinated. Husband at bedside is a good historian.   Assessment/Plan: Principal Problem:   Shock/ Kleb bacteremia/ Liver abscesses/ acute resp failure - hypovolemic shock from GI  bleed and septic shock from Kleb bacteremia and liver abscesses  -cultures negative from abscess and peritoneal fluid - AFB culture negative - pressors stopped on 7/14 - given Cipro, Zyvox and Imipenem on 7/12- then continued only on Imipenem to which the Klebsiella is sensitive- duration of antibiotics should be a minimum of 2 wks- it can be extended if abscess are not draining adequately -  Allergic to almost all classes of antibiotics?? 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  - - management of drains per IR - will allow PCP to manage antibiotics and decide on when to discontinue - will switch Imipenem to Ertapenem for ease of administration at home- ran this by ID today - Pain from tubes: switch Fentanyl to Norco today  ADDENDUM- ENTEROCOCCUS IN ABSCESS- ADDING ORAL ZYVOX PER PHARMACY FOR NOW  Active Problems: Upper GI bleed/ acute blood loss anemia  - 7/13- EGD- small gastric ulcer with some stigmata of recent bleed-- cont BID Protonix- has previous radiation gastritis/ duodenitis as well- Dr Cristina Gong notes she was on carafate as outpt- he does not feel she needs it but he states it can be continued-  he has signed off-     Acute renal failure   - likely from septic shock - Cr 2.5 on admission- now normalized to < 1.0   Cirrhosis with Portal HTN, Ascites, hepatic encephalopathy, mild coagulopathy, thrombocytopenia  - (a) encephalopathy- cont Lactulose and Xifaxin-  encephalopathy was severe prior to admission but she is very alert now- likely had metabolic encephalopathy due to infection rather than hepatic encephalopathy - usually Xifaxin is sufficient to control encephalopathy- depending on how much stool she is having, can cut back on Lactulose-have asked the staff to start measuring stool output more carefully as the patient cannot remember how many stools she is having  - (b) ascites- fluid overloaded currently due to resuscitation with fluid for shock -abdomen more distended than normal per patient- thighs edematous- will give an extra dose of Lasix 40 mg and Aldactone 100 mg at 4 PM today -Aldactone 50 mg & Lasix 40 mg (home dose) tomorrow morning - resume nadolol today  Cholangiocarcinoma  - received a couple doses of chemo last yr- did not tolerate it - then radiation in Sept/ Oct last yr - now being monitored with MRI Q 3 months- next appt this Friday at [facility]- hopefully she will be home by tomorrow or Thursday.   Hypokalemia - from lasix? - replace with 40 meq every 4 hrs x 2 doses-(on aldactone as well) - recheck tomorrow- will not put on daily replacement yet  Protein calorie malnutrition - cont supplements- Boost BID   Appt with PCP: requested Code Status: full code Family Communication: with husband in detail  Disposition Plan: able to ambulate in hall with rolling walker now- home tomorrow or next day-  ordered face to face HHRN, rolling walker, appt with PCP requested  DVT prophylaxis: SCDs Consultants:PCCM, GI Procedures: 7/12 intubated 7/13 EGD 7/16 R drainage of 2 liver abscesses w/ drains   Antibiotics: Anti-infectives    Start     Dose/Rate Route Frequency Ordered Stop   09/08/14 1200  imipenem-cilastatin (PRIMAXIN) 500 mg in sodium chloride 0.9 % 100 mL IVPB     500 mg 200 mL/hr over 30 Minutes Intravenous Every 8 hours 09/08/14 0931     09/06/14 1200  imipenem-cilastatin (PRIMAXIN) 250 mg in sodium chloride 0.9 % 100  mL IVPB  Status:  Discontinued     250 mg 200 mL/hr over 30 Minutes Intravenous 4 times per day 09/06/14 1037 09/08/14 0931   09/04/14 2200  rifaximin (XIFAXAN) tablet 550 mg     550 mg Oral 2 times daily 09/04/14 1023     09/03/14 2200  linezolid (ZYVOX) IVPB 600 mg  Status:  Discontinued     600 mg 300 mL/hr over 60 Minutes Intravenous Every 12 hours 09/03/14 0908 09/04/14 0817   09/03/14 1400  imipenem-cilastatin (PRIMAXIN) 250 mg in sodium chloride 0.9 % 100 mL IVPB  Status:  Discontinued     250 mg 200 mL/hr over 30 Minutes Intravenous 3 times per day 09/03/14 1041 09/06/14 1037   09/03/14 0800  imipenem-cilastatin (PRIMAXIN) 250 mg in sodium chloride 0.9 % 100 mL IVPB  Status:  Discontinued     250 mg 200 mL/hr over 30 Minutes Intravenous Every 12 hours 09/02/14 2027 09/03/14 1041   09/02/14 2200  rifaximin (XIFAXAN) tablet 550 mg  Status:  Discontinued     550 mg Per Tube 2 times daily 09/02/14 1603 09/04/14 1023   09/02/14 1800  imipenem-cilastatin (PRIMAXIN) 500 mg in sodium chloride 0.9 % 100 mL IVPB  Status:  Discontinued     500 mg 200 mL/hr over 30 Minutes Intravenous 4 times per day 09/02/14 1603 09/02/14 2027   09/02/14 1700  linezolid (ZYVOX) IVPB 600 mg  Status:  Discontinued     600 mg 300 mL/hr over 60 Minutes Intravenous Every 12 hours 09/02/14 1555 09/03/14 0832   09/02/14 1415  ciprofloxacin (CIPRO) IVPB 400 mg     400 mg 200 mL/hr over 60 Minutes Intravenous  Once 09/02/14 1414 09/02/14 1545      Objective: Filed Weights   09/07/14 0559 09/08/14 1100 09/09/14 0448  Weight: 63.4 kg (139 lb 12.4 oz) 63.4 kg (139 lb 12.4 oz) 66.271 kg (146 lb 1.6 oz)    Intake/Output Summary (Last 24 hours) at 09/09/14 0841 Last data filed at 09/09/14 0700  Gross per 24 hour  Intake    490 ml  Output   1085 ml  Net   -595 ml     Vitals Filed Vitals:   09/08/14 1100 09/08/14 1207 09/08/14 2021 09/09/14 0448  BP:  124/73 128/71 124/68  Pulse:   101 83  Temp:  97.8 F  (36.6 C) 98.4 F (36.9 C) 98 F (36.7 C)  TempSrc:  Oral Oral Oral  Resp:  18 18 20   Height:      Weight: 63.4 kg (139 lb 12.4 oz)   66.271 kg (146 lb 1.6 oz)  SpO2:  98% 96% 98%    Exam:  General:  Pt is alert, not in acute distress  HEENT: No icterus, No thrush, oral mucosa moist  Cardiovascular: regular rate and rhythm, S1/S2 No murmur  Respiratory: clear to auscultation bilaterally  Chest:  port a cath in right upper chest  Abdomen: Soft, +Bowel sounds, moderately distended, mildly tender, non distended, no guarding- drains in RUQ  MSK: No cyanosis or clubbing- thighs edematous but no pitting   Data Reviewed: Basic Metabolic Panel:  Recent Labs Lab 09/03/14 0645  09/04/14 0150 09/05/14 0400 09/06/14 0427 09/07/14 0600 09/08/14 0420 09/09/14 0545  NA  --   < > 133* 135 136 137 140 136  K  --   < > 3.7 3.4* 3.0* 3.7 3.5 3.0*  CL  --   < > 104 108 111 113* 116* 111  CO2  --   < > 19* 21* 21* 19* 20* 20*  GLUCOSE  --   < > 177* 174* 112* 105* 107* 90  BUN  --   < > 81* 81* 65* 56* 41* 29*  CREATININE  --   < > 1.77* 1.61* 1.32* 1.03* 0.96 0.95  CALCIUM  --   < > 7.4* 7.5* 7.4* 7.4* 7.4* 7.4*  MG 1.5*  --  3.1* 3.0*  --   --  2.4  --   PHOS 2.7  --  4.6 4.3  --   --  2.7  --   < > = values in this interval not displayed. Liver Function Tests:  Recent Labs Lab 09/02/14 1231 09/05/14 0400 09/06/14 0427 09/08/14 0420 09/09/14 0545  AST 353* 158* 189* 123* 102*  ALT 139* 87* 97* 72* 60*  ALKPHOS 707* 586* 778* 663* 675*  BILITOT 1.6* 2.3* 2.6* 2.6* 3.0*  PROT 6.1* 4.9* 5.0* 4.8* 5.0*  ALBUMIN 1.8* 1.6* 1.6* 1.5*  1.5* 1.6*   No results for input(s): LIPASE, AMYLASE in the last 168 hours.  Recent Labs Lab 09/02/14 1231 09/04/14 0150  AMMONIA 176* 85*   CBC:  Recent Labs Lab 09/02/14 1231  09/05/14 0400 09/06/14 0427 09/07/14 0600 09/08/14 0420 09/09/14 0545  WBC 21.9*  < > 13.5* 9.8 9.3 9.3 11.2*  NEUTROABS 18.8*  --   --   --   --  7.0   --   HGB 10.1*  < > 7.5* 8.1* 7.9* 8.7* 7.4*  HCT 30.7*  < > 21.9* 23.8* 23.8* 27.4* 22.1*  MCV 83.2  < > 81.1 80.7 84.1 84.8 85.3  PLT 73*  < > 37* 48* 94* 71* 100*  < > = values in this interval not displayed. Cardiac Enzymes:  Recent Labs Lab 09/02/14 2340  TROPONINI 0.05*   BNP (last 3 results) No results for input(s): BNP in the last 8760 hours.  ProBNP (last 3 results) No results for input(s): PROBNP in the last 8760 hours.  CBG:  Recent Labs Lab 09/08/14 0827 09/08/14 1244 09/08/14 1749 09/08/14 2354 09/09/14 0701  GLUCAP 92 141* 128* 97 79    Recent Results (from the past 240 hour(s))  Culture, blood (routine x 2)     Status: None   Collection Time: 09/02/14  1:51 PM  Result Value Ref Range Status   Specimen Description BLOOD LEFT HAND  Final   Special Requests BOTTLES DRAWN AEROBIC AND ANAEROBIC 2ML  Final   Culture  Setup Time   Final    GRAM NEGATIVE RODS IN BOTH AEROBIC AND ANAEROBIC BOTTLES CRITICAL RESULT CALLED TO, READ BACK BY AND VERIFIED WITH: A [facility] 09/03/14 AT 0358 RHOLMES CONFIRMED BY M CAMPBELL    Culture   Final    KLEBSIELLA SPECIES SUSCEPTIBILITIES PERFORMED ON PREVIOUS CULTURE WITHIN THE LAST 5 DAYS. Performed at [facility]    Report Status 09/05/2014 FINAL  Final  Culture, blood (routine x 2)     Status: None   Collection Time: 09/02/14  2:10 PM  Result Value Ref Range Status   Specimen Description BLOOD RIGHT ANTECUBITAL  Final   Special Requests BOTTLES DRAWN AEROBIC AND ANAEROBIC 5CC  Final   Culture  Setup Time   Final    GRAM NEGATIVE RODS IN BOTH AEROBIC AND ANAEROBIC BOTTLES CRITICAL RESULT CALLED TO, READ BACK BY AND VERIFIED WITH: A [facility] 09/03/14 AT 0358 RHOLMES CONFIRMED BY M CAMPBELL    Culture   Final    KLEBSIELLA SPECIES Performed at [facility]    Report Status 09/05/2014 FINAL  Final   Organism ID, Bacteria KLEBSIELLA SPECIES  Final      Susceptibility   Klebsiella species - MIC*     AMPICILLIN 16 RESISTANT Resistant     CEFAZOLIN <=4 SENSITIVE Sensitive     CEFEPIME <=1 SENSITIVE Sensitive     CEFTAZIDIME <=1 SENSITIVE Sensitive     CEFTRIAXONE <=1 SENSITIVE Sensitive     CIPROFLOXACIN >=4 RESISTANT Resistant     GENTAMICIN <=1 SENSITIVE Sensitive     IMIPENEM <=0.25 SENSITIVE Sensitive     TRIMETH/SULFA <=20 SENSITIVE Sensitive     AMPICILLIN/SULBACTAM 4 SENSITIVE Sensitive     PIP/TAZO 8 SENSITIVE Sensitive     * KLEBSIELLA SPECIES  Urine culture     Status: None   Collection Time: 09/02/14  2:23 PM  Result Value Ref Range Status   Specimen Description URINE, CATHETERIZED  Final   Special Requests NONE  Final   Culture   Final    NO GROWTH 1 DAY Performed at [facility]    Report Status 09/03/2014 FINAL  Final  MRSA PCR Screening     Status: None  Collection Time: 09/02/14  5:01 PM  Result Value Ref Range Status   MRSA by PCR NEGATIVE NEGATIVE Final    Comment:        The GeneXpert MRSA Assay (FDA approved for NASAL specimens only), is one component of a comprehensive MRSA colonization surveillance program. It is not intended to diagnose MRSA infection nor to guide or monitor treatment for MRSA infections.   Culture, respiratory (NON-Expectorated)     Status: None   Collection Time: 09/04/14  8:31 AM  Result Value Ref Range Status   Specimen Description TRACHEAL ASPIRATE  Final   Special Requests Normal  Final   Gram Stain   Final    FEW WBC PRESENT,BOTH PMN AND MONONUCLEAR RARE SQUAMOUS EPITHELIAL CELLS PRESENT RARE YEAST Performed at [facility]    Culture   Final    MODERATE YEAST CONSISTENT WITH CANDIDA SPECIES Performed at [facility]    Report Status 09/07/2014 FINAL  Final  Culture, routine-abscess     Status: None (Preliminary result)   Collection Time: 09/06/14 12:22 PM  Result Value Ref Range Status   Specimen Description LIVER  Final   Special Requests NONE  Final   Gram Stain   Final    MODERATE  WBC PRESENT,BOTH PMN AND MONONUCLEAR NO SQUAMOUS EPITHELIAL CELLS SEEN FEW GRAM POSITIVE COCCI IN CLUSTERS IN CHAINS Performed at [facility]    Culture   Final    Culture reincubated for better growth Performed at [facility]    Report Status PENDING  Incomplete  Culture, routine-abscess     Status: None (Preliminary result)   Collection Time: 09/06/14 12:22 PM  Result Value Ref Range Status   Specimen Description LIVER RIGHT LIVER LOBE ABSCESS  Final   Special Requests NONE  Final   Gram Stain   Final    NO WBC SEEN NO SQUAMOUS EPITHELIAL CELLS SEEN NO ORGANISMS SEEN Performed at [facility]    Culture   Final    NO GROWTH 2 DAYS Performed at [facility]    Report Status PENDING  Incomplete  AFB culture with smear     Status: None (Preliminary result)   Collection Time: 09/06/14 12:26 PM  Result Value Ref Range Status   Specimen Description PERITONEAL CAVITY  Final   Special Requests NONE  Final   Acid Fast Smear   Final    NO ACID FAST BACILLI SEEN Performed at [facility]    Culture   Final    CULTURE WILL BE EXAMINED FOR 6 WEEKS BEFORE ISSUING A FINAL REPORT Performed at [facility]    Report Status PENDING  Incomplete  Culture, body fluid-bottle     Status: None (Preliminary result)   Collection Time: 09/06/14 12:26 PM  Result Value Ref Range Status   Specimen Description PERITONEAL FLUID  Final   Special Requests NONE  Final   Gram Stain   Final    WBC PRESENT, PREDOMINANTLY PMN NO ORGANISMS SEEN CONFIRMED BY K. WOOTEN    Culture   Final    NO GROWTH 2 DAYS Performed at [facility]    Report Status PENDING  Incomplete  Gram stain     Status: None   Collection Time: 09/06/14 12:26 PM  Result Value Ref Range Status   Specimen Description PERITONEAL  Final   Special Requests NONE  Final   Gram Stain   Final    WBC PRESENT, PREDOMINANTLY PMN NO ORGANISMS SEEN  CONFIRMED BY Louie Casa Performed at [facility]    Report Status 09/07/2014 FINAL  Final  Anaerobic culture     Status: None (Preliminary result)   Collection Time: 09/06/14 12:36 PM  Result Value Ref Range Status   Specimen Description PERITONEAL CAVITY  Final   Special Requests NONE  Final   Gram Stain   Final    NO WBC SEEN NO SQUAMOUS EPITHELIAL CELLS SEEN NO ORGANISMS SEEN Performed at [facility]    Culture   Final    NO ANAEROBES ISOLATED; CULTURE IN PROGRESS FOR 5 DAYS Performed at [facility]    Report Status PENDING  Incomplete     Studies: No results found.  Scheduled Meds:  Scheduled Meds: . furosemide  40 mg Oral Daily  . imipenem-cilastatin  500 mg Intravenous Q8H  . lactose free nutrition  237 mL Oral BID BM  . lactulose  30 g Oral BID  . pantoprazole  40 mg Oral BID  . rifaximin  550 mg Oral BID  . spironolactone  25 mg Oral Daily  . sucralfate  1 g Oral TID WC & HS   Continuous Infusions:   Time spent on care of this patient: 61 min   [place], MD 09/09/2014, 8:41 AM  LOS: 7 days   [facility] Office  [phone] Pager - Text Page per [email or web address] If 7PM-7AM, please contact night-coverage [email or web address]

## 2014-09-09 NOTE — Progress Notes (Addendum)
ANTIBIOTIC CONSULT NOTE - FOLLOW UP  Pharmacy Consult for Zyvox Indication: Liver abscess  Allergies  Allergen Reactions  . Rocephin [Ceftriaxone] Other (See Comments)    Dress syndrome  . Vancomycin Other (See Comments)    Dress syndrome.   Marland Kitchen Zosyn [Piperacillin Sod-Tazobactam So]     Dress syndrome - TOLERATES PRIMAXIN  . Amoxicillin Rash  . Sulfa Antibiotics Rash    Patient Measurements: Height: 5\' 6"  (167.6 cm) Weight: 146 lb 1.6 oz (66.271 kg) IBW/kg (Calculated) : 59.3  Vital Signs: Temp: 98.4 F (36.9 C) (07/19 1338) Temp Source: Oral (07/19 1338) BP: 113/66 mmHg (07/19 1338) Pulse Rate: 85 (07/19 1338) Intake/Output from previous day: 07/18 0701 - 07/19 0700 In: 490 [P.O.:240; I.V.:150; IV Piggyback:100] Out: 1085 [Urine:1000; Drains:85]  Labs:  Recent Labs  09/07/14 0600 09/08/14 0420 09/09/14 0545  WBC 9.3 9.3 11.2*  HGB 7.9* 8.7* 7.4*  PLT 94* 71* 100*  CREATININE 1.03* 0.96 0.95   Estimated Creatinine Clearance: 58.2 mL/min (by C-G formula based on Cr of 0.95). No results for input(s): VANCOTROUGH, VANCOPEAK, VANCORANDOM, GENTTROUGH, GENTPEAK, GENTRANDOM, TOBRATROUGH, TOBRAPEAK, TOBRARND, AMIKACINPEAK, AMIKACINTROU, AMIKACIN in the last 72 hours.    Assessment: 81 yoF admitted 7/12 with worsening encephalopathy and hematemesis.  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 Chronic Cipro PTA. He was started on linezolid and imipenem for possible intra-abdominal infection +/- UTI, but narrowed to imipenem alone on 7/14.  She has several antibiotics to be avoided d/t h/o DRESS. Drains placed in 2 liver abscesses 7/16.  Tm afebrile since 7/16 WBC remain WNL SCr improved to wnl; CrCl 58 ml/min  PTA >> Cipro >> 7/12 7/12 >> Linezolid >> 7/14; restart 7/19 >> 7/12 >> Primaxin >> 7/19 7/19 >> Ertapenem  7/12 blood x2: Klebsiella (sens: Unasyn, cefaz, cefepime, ceftaz, CTX, imi,  P/T, T/S) 7/12 urine: NGF 7/14 sputum: moderate candida 7/16 Peritoneal fluid: NGTD; AFB pending 7/16 liver abscess #1: Enterococcus, mod GNR (note imipenem covers enterococcus but ertapenem does not) 7/16 liver abscess #2: NGF  Goal of Therapy:  Appropriate abx dosing, eradication of infection.   Plan:  Day 8 antibiotics  Resume linezolid 600 mg PO q12 hr  Ertapenem dosing is appropriate  Follow up renal fxn, culture results, and clinical course  Watch for signs of optic neuritis, serotonin syndrome, thrombocytopenia) while on Linezolid  Reuel Boom, PharmD, BCPS Pager: [phone] 09/09/2014, 2:06 PM

## 2014-09-09 NOTE — Progress Notes (Signed)
Spoke with pt concerning Home Health needs. Pt asked for the [facility] agency that she had before. Checked with [place]. Pt had used [facility] before. Referral given to [facility] in house rep.

## 2014-09-09 NOTE — Progress Notes (Signed)
Referring Physician(s): CCM  Subjective: Patient with c/o abdominal pain at RUQ drain site worsening from previous days. She is tolerating diet without N/V.  Allergies: Rocephin; Vancomycin; Zosyn; Amoxicillin; and Sulfa antibiotics  Medications: Prior to Admission medications   Medication Sig Start Date End Date Taking? Authorizing Provider  ciprofloxacin (CIPRO) 500 MG tablet Take 1 tablet (500 mg total) by mouth daily with breakfast. 02/12/14  Yes Robbie Lis, MD  furosemide (LASIX) 40 MG tablet Take 40 mg by mouth daily. 08/04/14  Yes Historical Provider, MD  lactulose (CHRONULAC) 10 GM/15ML solution Take 45 mLs (30 g total) by mouth 2 (two) times daily. Patient taking differently: Take 30 g by mouth 3 (three) times daily.  04/04/14  Yes Bonnielee Haff, MD  metoCLOPramide (REGLAN) 5 MG tablet Take 5 mg by mouth 4 (four) times daily as needed for nausea (nausea).   Yes Historical Provider, MD  nadolol (CORGARD) 20 MG tablet Take 10 mg by mouth daily.   Yes Historical Provider, MD  ondansetron (ZOFRAN-ODT) 4 MG disintegrating tablet Take 4 mg by mouth at bedtime as needed for nausea.  06/10/14  Yes Historical Provider, MD  pantoprazole (PROTONIX) 40 MG tablet Take 1 tablet (40 mg total) by mouth every 12 (twelve) hours. 02/12/14  Yes Robbie Lis, MD  rifaximin (XIFAXAN) 550 MG TABS tablet Take 1 tablet (550 mg total) by mouth 2 (two) times daily. 02/12/14  Yes Robbie Lis, MD  spironolactone (ALDACTONE) 50 MG tablet Take 1 tablet (50 mg total) by mouth daily. 02/12/14 02/12/15 Yes Robbie Lis, MD  sucralfate (CARAFATE) 1 GM/10ML suspension Take 10 mLs (1 g total) by mouth 3 (three) times daily with meals. 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  She presents on this hospital admission with evidence of abscess in the right liver lobe.  She also has ascitic fluid surrounding the liver, and she has been referred for abscess drainage and diagnostic paracentesis.  EXAM: ULTRASOUND GUIDED DRAINAGE OF RIGHT LIVER LOBE ABSCESS  ULTRASOUND-GUIDED PARACENTESIS FOR DIAGNOSTIC PURPOSE  MEDICATIONS: 1.0 mg IV Versed; 50 mcg IV Fentanyl  Total Moderate Sedation Time: 37  PROCEDURE: The procedure, risks, benefits, and alternatives were explained to the patient. Questions regarding the procedure were encouraged and answered. The patient understands and consents to the procedure.  Ultrasound survey of the right upper quadrant was performed with images stored and sent to PACs.  The right upper quad was prepped with Betadine in a sterile fashion, and a sterile drape was applied covering the operative field. A sterile gown and sterile gloves were used for the procedure. Local anesthesia was provided with 1% Lidocaine.  Once the patient is prepped and draped in the usual sterile fashion, the skin and subcutaneous tissues were generously infiltrated with 1% lidocaine for local anesthesia. A small stab incision was made with 11 blade scalpel, and then trocar technique was used to advance a 10 [place]  drain in an intercostal location into gas and fluid collection at the dome of the liver.  The drain was advanced into this collection and the pigtail catheter was formed. Combination of gas and brown bilious fluid were aspirated. Catheter was fixed to the skin with a StatLock.  A second collection at the inferior aspect the right liver lobe was targeted. The skin and subcutaneous tissues were  generously infiltrated with 1% lidocaine, and a 10 French drain was advanced into this collection with trocar technique. Pigtail catheter was formed and serosanguineous fluid was aspirated from this collection. This drain was anchored to the skin with a StatLock.  Finally, the ascitic fluid in the right upper quadrant was then addressed by advancing yueh catheter under ultrasound guidance after anesthesia of the skin and subcutaneous tissues with 1% lidocaine.  A total of 430 cc of ascitic fluid was aspirated.  The superior drain was attached to gravity drainage, draining the dome of the liver abscess, and the inferior drain was attached to gravity drainage, draining a right liver lobe lesion.  Patient tolerated the procedure well and remained hemodynamically stable throughout.  No complications were encountered and no significant blood loss was encountered.  COMPLICATIONS: None.  FINDINGS: Ultrasound survey demonstrates gas and fluid collection at the liver dome. In addition there is a smaller collection more inferiorly along the inferior right liver lobe.  Ascitic fluid of the right upper quadrant was also identified by ultrasound survey.  Final images demonstrate drain within the dome of the abscess attached to gravity drain, which is the superiorly placed drain in the right upper quadrant.  The inferiorly placed drain is within the more inferior right liver lobe collection.  IMPRESSION: Status post ultrasound-guided drainage of 2 right-sided gas and fluid collections. Sample sent to the lab from each collection.  Status post ultrasound-guided diagnostic paracentesis.  Signed,  Dulcy Fanny. Earleen Newport, DO  [facility]  [facility]  PLAN: The drain that is cranially placed/superior drains the larger gas and fluid collection of the liver dome. This was labeled drain 1.  The drain that is caudally placed/inferior drains the smaller fluid collection at the right liver. This was  labeled drain 2.   Electronically Signed   By: Corrie Mckusick D.O.   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  She presents on this hospital admission with evidence of abscess in the right liver lobe.  She also has ascitic fluid surrounding the liver, and she has been referred for abscess drainage and diagnostic paracentesis.  EXAM: ULTRASOUND GUIDED DRAINAGE OF RIGHT LIVER LOBE ABSCESS  ULTRASOUND-GUIDED PARACENTESIS FOR DIAGNOSTIC PURPOSE  MEDICATIONS: 1.0 mg IV Versed; 50 mcg IV Fentanyl  Total Moderate Sedation Time: 37  PROCEDURE: The procedure, risks, benefits, and alternatives were explained to the patient. Questions regarding the procedure were encouraged and answered. The patient understands and consents to the procedure.  Ultrasound survey of the right upper quadrant was performed with images stored and sent to PACs.  The right upper quad was prepped with Betadine in a sterile fashion, and a sterile drape was applied covering the operative field. A sterile gown and sterile gloves were used for the procedure. Local anesthesia was provided with 1% Lidocaine.  Once the patient is prepped and draped in the usual sterile fashion, the skin and subcutaneous tissues were generously infiltrated  with 1% lidocaine for local anesthesia. A small stab incision was made with 11 blade scalpel, and then trocar technique was used to advance a 10 [place] drain in an intercostal location into gas and fluid collection at the dome of the liver.  The drain was advanced into this collection and the pigtail catheter was formed. Combination of gas and brown bilious fluid were aspirated. Catheter was fixed to the skin with a StatLock.  A second collection at the inferior aspect the right liver lobe was targeted. The skin and subcutaneous tissues were generously infiltrated  with 1% lidocaine, and a 10 French drain was advanced into this collection with trocar technique. Pigtail catheter was formed and serosanguineous fluid was aspirated from this collection. This drain was anchored to the skin with a StatLock.  Finally, the ascitic fluid in the right upper quadrant was then addressed by advancing yueh catheter under ultrasound guidance after anesthesia of the skin and subcutaneous tissues with 1% lidocaine.  A total of 430 cc of ascitic fluid was aspirated.  The superior drain was attached to gravity drainage, draining the dome of the liver abscess, and the inferior drain was attached to gravity drainage, draining a right liver lobe lesion.  Patient tolerated the procedure well and remained hemodynamically stable throughout.  No complications were encountered and no significant blood loss was encountered.  COMPLICATIONS: None.  FINDINGS: Ultrasound survey demonstrates gas and fluid collection at the liver dome. In addition there is a smaller collection more inferiorly along the inferior right liver lobe.  Ascitic fluid of the right upper quadrant was also identified by ultrasound survey.  Final images demonstrate drain within the dome of the abscess attached to gravity drain, which is the superiorly placed drain in the right upper quadrant.  The inferiorly placed drain is within the more inferior right liver lobe collection.  IMPRESSION: Status post ultrasound-guided drainage of 2 right-sided gas and fluid collections. Sample sent to the lab from each collection.  Status post ultrasound-guided diagnostic paracentesis.  Signed,  Dulcy Fanny. Earleen Newport, DO  [facility]  [facility]  PLAN: The drain that is cranially placed/superior drains the larger gas and fluid collection of the liver dome. This was labeled drain 1.  The drain that is caudally placed/inferior drains the smaller fluid collection at the right liver. This was labeled drain 2.    Electronically Signed   By: Corrie Mckusick D.O.   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  She presents on this hospital admission with evidence of abscess in the right liver lobe.  She also has ascitic fluid surrounding the liver, and she has been referred for abscess drainage and diagnostic paracentesis.  EXAM: ULTRASOUND GUIDED DRAINAGE OF RIGHT LIVER LOBE ABSCESS  ULTRASOUND-GUIDED PARACENTESIS FOR DIAGNOSTIC PURPOSE  MEDICATIONS: 1.0 mg IV Versed; 50 mcg IV Fentanyl  Total Moderate Sedation Time: 37  PROCEDURE: The procedure, risks, benefits, and alternatives were explained to the patient. Questions regarding the procedure were encouraged and answered. The patient understands and consents to the procedure.  Ultrasound survey of the right upper quadrant was performed with images stored and sent to PACs.  The right upper quad was prepped with Betadine in a sterile fashion, and a sterile drape was applied covering the operative field. A sterile gown and sterile gloves were used for the procedure. Local  anesthesia was provided with 1% Lidocaine.  Once the patient is prepped and draped in the usual sterile fashion, the skin and subcutaneous tissues were generously infiltrated with 1% lidocaine for local anesthesia. A small stab incision was made with 11 blade scalpel, and then trocar technique was used to advance a 10 [place] drain in an intercostal location into gas and fluid collection at the dome of the liver.  The drain was advanced into this collection and the pigtail catheter was formed. Combination of gas and brown bilious fluid were aspirated. Catheter was fixed to the skin with a StatLock.  A second collection at the inferior aspect the right liver lobe was targeted. The skin and subcutaneous tissues were generously infiltrated with 1% lidocaine, and  a 10 French drain was advanced into this collection with trocar technique. Pigtail catheter was formed and serosanguineous fluid was aspirated from this collection. This drain was anchored to the skin with a StatLock.  Finally, the ascitic fluid in the right upper quadrant was then addressed by advancing yueh catheter under ultrasound guidance after anesthesia of the skin and subcutaneous tissues with 1% lidocaine.  A total of 430 cc of ascitic fluid was aspirated.  The superior drain was attached to gravity drainage, draining the dome of the liver abscess, and the inferior drain was attached to gravity drainage, draining a right liver lobe lesion.  Patient tolerated the procedure well and remained hemodynamically stable throughout.  No complications were encountered and no significant blood loss was encountered.  COMPLICATIONS: None.  FINDINGS: Ultrasound survey demonstrates gas and fluid collection at the liver dome. In addition there is a smaller collection more inferiorly along the inferior right liver lobe.  Ascitic fluid of the right upper quadrant was also identified by ultrasound survey.  Final images demonstrate drain within the dome of the abscess attached to gravity drain, which is the superiorly placed drain in the right upper quadrant.  The inferiorly placed drain is within the more inferior right liver lobe collection.  IMPRESSION: Status post ultrasound-guided drainage of 2 right-sided gas and fluid collections. Sample sent to the lab from each collection.  Status post ultrasound-guided diagnostic paracentesis.  Signed,  Dulcy Fanny. Earleen Newport, DO  [facility]  [facility]  PLAN: The drain that is cranially placed/superior drains the larger gas and fluid collection of the liver dome. This was labeled drain 1.  The drain that is caudally placed/inferior drains the smaller fluid collection at the right liver. This was labeled drain 2.   Electronically Signed   By:  Corrie Mckusick D.O.   On: 09/09/2014 08:40    Labs:  CBC:  Recent Labs  09/06/14 0427 09/07/14 0600 09/08/14 0420 09/09/14 0545  WBC 9.8 9.3 9.3 11.2*  HGB 8.1* 7.9* 8.7* 7.4*  HCT 23.8* 23.8* 27.4* 22.1*  PLT 48* 94* 71* 100*    COAGS:  Recent Labs  02/06/14 1444  09/05/14 0400 09/06/14 0427 09/08/14 0420 09/09/14 0545  INR 1.04  < > 1.30 1.33 1.46 1.34  APTT 21*  --  34 31 36  --   < > = values in this interval not displayed.  BMP:  Recent Labs  09/06/14 0427 09/07/14 0600 09/08/14 0420 09/09/14 0545  NA 136 137 140 136  K 3.0* 3.7 3.5 3.0*  CL 111 113* 116* 111  CO2 21* 19* 20* 20*  GLUCOSE 112* 105* 107* 90  BUN 65* 56* 41* 29*  CALCIUM 7.4* 7.4* 7.4* 7.4*  CREATININE  1.32* 1.03* 0.96 0.95  GFRNONAA 43* 57* >60 >60  GFRAA 49* >60 >60 >60    LIVER FUNCTION TESTS:  Recent Labs  09/05/14 0400 09/06/14 0427 09/08/14 0420 09/09/14 0545  BILITOT 2.3* 2.6* 2.6* 3.0*  AST 158* 189* 123* 102*  ALT 87* 97* 72* 60*  ALKPHOS 586* 778* 663* 675*  PROT 4.9* 5.0* 4.8* 5.0*  ALBUMIN 1.6* 1.6* 1.5*  1.5* 1.6*    Assessment and Plan: Cholangiocarcinoma with history of partial hepatic resection in 04/2014 Septic shock Hepatic abscess S/p (2) Perc drains placed 7/16, good output, wbc trend up today, H/H trend down? Patient with c/o drain site worsening pain-vitals stable-follow trend, repeat imaging when output < 10cc/24 hrs or clinically worsens  Continue drain flushes and monitor daily output Plans per GI/Primary team   Signed: Tsosie Billing D 09/09/2014, 10:57 AM   I spent a total of 15 Minutes in face to face in clinical consultation/evaluation, greater than 50% of which was counseling/coordinating care for hepatic abscess.

## 2014-09-10 DIAGNOSIS — R571 Hypovolemic shock: Secondary | ICD-10-CM

## 2014-09-10 LAB — CBC
HEMATOCRIT: 20.8 % — AB (ref 36.0–46.0)
Hemoglobin: 6.9 g/dL — CL (ref 12.0–15.0)
MCH: 28.3 pg (ref 26.0–34.0)
MCHC: 33.2 g/dL (ref 30.0–36.0)
MCV: 85.2 fL (ref 78.0–100.0)
Platelets: 109 10*3/uL — ABNORMAL LOW (ref 150–400)
RBC: 2.44 MIL/uL — AB (ref 3.87–5.11)
RDW: 17.4 % — AB (ref 11.5–15.5)
WBC: 10.5 10*3/uL (ref 4.0–10.5)

## 2014-09-10 LAB — BASIC METABOLIC PANEL
Anion gap: 4 — ABNORMAL LOW (ref 5–15)
BUN: 26 mg/dL — ABNORMAL HIGH (ref 6–20)
CALCIUM: 7.4 mg/dL — AB (ref 8.9–10.3)
CO2: 23 mmol/L (ref 22–32)
Chloride: 108 mmol/L (ref 101–111)
Creatinine, Ser: 0.99 mg/dL (ref 0.44–1.00)
GFR calc non Af Amer: >60 mL/min (ref >60)
Glucose, Bld: 100 mg/dL — ABNORMAL HIGH (ref 65–99)
POTASSIUM: 3.8 mmol/L (ref 3.5–5.1)
Sodium: 135 mmol/L (ref 135–145)

## 2014-09-10 LAB — CULTURE, ROUTINE-ABSCESS

## 2014-09-10 LAB — PREPARE RBC (CROSSMATCH)

## 2014-09-10 LAB — HEMOGLOBIN AND HEMATOCRIT, BLOOD
HEMATOCRIT: 20.8 % — AB (ref 36.0–46.0)
Hemoglobin: 6.8 g/dL — CL (ref 12.0–15.0)

## 2014-09-10 MED ORDER — SODIUM CHLORIDE 0.9 % IV SOLN: Freq: Once | INTRAVENOUS | Status: DC

## 2014-09-10 MED ORDER — SODIUM CHLORIDE 0.9 % IJ SOLN
10.0000 mL | INTRAMUSCULAR | Status: DC | PRN
  Administered 2014-09-10 – 2014-09-11 (×3): 10 mL
  Filled 2014-09-10 (×3): qty 40

## 2014-09-10 NOTE — Progress Notes (Signed)
PT Cancellation Note  Patient Details Name: Joann Castaneda MRN: [id number] DOB: 1955-07-01   Cancelled Treatment:     Nursing reported pt has been amb with family several times a day.  About to go on her 5th trip when I arrived.  Pt highly motivated to get home.  Will update LPT.   Nathanial Rancher 09/10/2014, 2:55 PM

## 2014-09-10 NOTE — Progress Notes (Signed)
[facility] PROGRESS NOTE  Joann Castaneda EQA:[id number] DOB: 1956-01-06 DOA: 09/02/2014 PCP: Cari Caraway, MD   Brief narrative: From prior progress note Joann Castaneda is a 59 y.o. 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 She had been on chemo (only 2 doses) and radiation from September to October and developed radiation induced gastritis. EGD in April showed grade 1 varices and improvement in duodenitis and gastritis felt second to be prior to previous radiation.  She has portal HTN, ascites, hepatic encephalopathy..  She was admitted to the ICU after an episode of hematemesis on 7/12. She had further hematemesis in the ER and was hypotensive with SBP in 90s, lactic acid was 10, Hb dropped from 10 to 7.5. She was started on Octreotide, Protinx, given Vit K and given 2 UPRBC and intubated for airway protection.   EGD on 7/13 reveals : small gastric ulcer with some stigmata of hemorrhage, no active bleeding no significant blood in the stomach. Small nonbleeding esophageal varices. Antral gastritis and duodenitis possibly from previous radiation.   RUL ultrasound 7/12 revealed complex cysts in the liver CT abd pelvis on 7/13 revealed 3 cysts in the right lobe of liver ? Abscess 7/14- pressors stopped / extubated 7/16 under when IR guided drain placement in liver- 2 drains Blood culture returned positive for Klebsiella- Abscess culture still pending  Assessment/Plan: Principal Problem:   Hypovolemic shock - Resolved and improved with IV antibiotics and IV fluid rehydration.  Bacteremia gram-negative rods with Klebsiella species - Plan at this moment is continue current antibiotics regimen.  Anemia - EGD on 7/13 revealed small gastric ulcer with some  stigmata of hemorrhage and no active bleeding. I suspect worsening anemia as to secondary to recent bleeding as well as sequelae of bacteremia - We'll plan on transfusing and reassessing hemoglobin levels next a.m.  Active Problems:   Hypokalemia -Resolved on last check. 3.8    Hepatic abscess - Drain in place, other drain pulled. Plan is for IR to reevaluate after patient is discharged. Length of IV antibiotics will most likely be affected by length of time drain is in place   Code Status: full Family Communication: discussed with spouse and patient  Disposition Plan: Pending improvement in condition   Consultants:  none  Procedures:  Drain placement  Antibiotics:  invanze  zyvox  Rifaximin  HPI/Subjective: Pt has no new complaints. Looking forward to going home soon  Objective: Filed Vitals:   09/10/14 1405  BP: 108/67  Pulse: 85  Temp: 97.7 F (36.5 C)  Resp: 20    Intake/Output Summary (Last 24 hours) at 09/10/14 1420 Last data filed at 09/10/14 1405  Gross per 24 hour  Intake    490 ml  Output      0 ml  Net    490 ml   Filed Weights   09/08/14 1100 09/09/14 0448 09/10/14 0458  Weight: 63.4 kg (139 lb 12.4 oz) 66.271 kg (146 lb 1.6 oz) 65.273 kg (143 lb 14.4 oz)    Exam:   General:  Pt in nad, alert and awake3  Cardiovascular: rrr, no mrg  Respiratory: cta bl, no wheezes  Abdomen: soft, NT, ND  Musculoskeletal: Equal Tone bilaterally  Data Reviewed: Basic Metabolic Panel:  Recent Labs Lab 09/04/14 0150 09/05/14 0400 09/06/14 0427 09/07/14 0600 09/08/14 0420 09/09/14 0545 09/10/14 0312  NA  133* 135 136 137 140 136 135  K 3.7 3.4* 3.0* 3.7 3.5 3.0* 3.8  CL 104 108 111 113* 116* 111 108  CO2 19* 21* 21* 19* 20* 20* 23  GLUCOSE 177* 174* 112* 105* 107* 90 100*  BUN 81* 81* 65* 56* 41* 29* 26*  CREATININE 1.77* 1.61* 1.32* 1.03* 0.96 0.95 0.99  CALCIUM 7.4* 7.5* 7.4* 7.4* 7.4* 7.4* 7.4*  MG 3.1* 3.0*  --   --  2.4  --   --    PHOS 4.6 4.3  --   --  2.7  --   --    Liver Function Tests:  Recent Labs Lab 09/05/14 0400 09/06/14 0427 09/08/14 0420 09/09/14 0545  AST 158* 189* 123* 102*  ALT 87* 97* 72* 60*  ALKPHOS 586* 778* 663* 675*  BILITOT 2.3* 2.6* 2.6* 3.0*  PROT 4.9* 5.0* 4.8* 5.0*  ALBUMIN 1.6* 1.6* 1.5*  1.5* 1.6*   No results for input(s): LIPASE, AMYLASE in the last 168 hours.  Recent Labs Lab 09/04/14 0150  AMMONIA 85*   CBC:  Recent Labs Lab 09/06/14 0427 09/07/14 0600 09/08/14 0420 09/09/14 0545 09/10/14 0312 09/10/14 0805  WBC 9.8 9.3 9.3 11.2* 10.5  --   NEUTROABS  --   --  7.0  --   --   --   HGB 8.1* 7.9* 8.7* 7.4* 6.9* 6.8*  HCT 23.8* 23.8* 27.4* 22.1* 20.8* 20.8*  MCV 80.7 84.1 84.8 85.3 85.2  --   PLT 48* 94* 71* 100* 109*  --    Cardiac Enzymes: No results for input(s): CKTOTAL, CKMB, CKMBINDEX, TROPONINI in the last 168 hours. BNP (last 3 results) No results for input(s): BNP in the last 8760 hours.  ProBNP (last 3 results) No results for input(s): PROBNP in the last 8760 hours.  CBG:  Recent Labs Lab 09/08/14 1244 09/08/14 1749 09/08/14 2354 09/09/14 0701 09/09/14 1201  GLUCAP 141* 128* 97 79 124*    Recent Results (from the past 240 hour(s))  Culture, blood (routine x 2)     Status: None   Collection Time: 09/02/14  1:51 PM  Result Value Ref Range Status   Specimen Description BLOOD LEFT HAND  Final   Special Requests BOTTLES DRAWN AEROBIC AND ANAEROBIC 2ML  Final   Culture  Setup Time   Final    GRAM NEGATIVE RODS IN BOTH AEROBIC AND ANAEROBIC BOTTLES CRITICAL RESULT CALLED TO, READ BACK BY AND VERIFIED WITH: A [facility] 09/03/14 AT 0358 RHOLMES CONFIRMED BY M CAMPBELL    Culture   Final    KLEBSIELLA SPECIES SUSCEPTIBILITIES PERFORMED ON PREVIOUS CULTURE WITHIN THE LAST 5 DAYS. Performed at [facility]    Report Status 09/05/2014 FINAL  Final  Culture, blood (routine x 2)     Status: None   Collection Time: 09/02/14  2:10 PM   Result Value Ref Range Status   Specimen Description BLOOD RIGHT ANTECUBITAL  Final   Special Requests BOTTLES DRAWN AEROBIC AND ANAEROBIC 5CC  Final   Culture  Setup Time   Final    GRAM NEGATIVE RODS IN BOTH AEROBIC AND ANAEROBIC BOTTLES CRITICAL RESULT CALLED TO, READ BACK BY AND VERIFIED WITH: A [facility] 09/03/14 AT [place] BY M CAMPBELL    Culture   Final    KLEBSIELLA SPECIES Performed at [facility]    Report Status 09/05/2014 FINAL  Final   Organism ID, Bacteria KLEBSIELLA SPECIES  Final      Susceptibility  Klebsiella species - MIC*    AMPICILLIN 16 RESISTANT Resistant     CEFAZOLIN <=4 SENSITIVE Sensitive     CEFEPIME <=1 SENSITIVE Sensitive     CEFTAZIDIME <=1 SENSITIVE Sensitive     CEFTRIAXONE <=1 SENSITIVE Sensitive     CIPROFLOXACIN >=4 RESISTANT Resistant     GENTAMICIN <=1 SENSITIVE Sensitive     IMIPENEM <=0.25 SENSITIVE Sensitive     TRIMETH/SULFA <=20 SENSITIVE Sensitive     AMPICILLIN/SULBACTAM 4 SENSITIVE Sensitive     PIP/TAZO 8 SENSITIVE Sensitive     * KLEBSIELLA SPECIES  Urine culture     Status: None   Collection Time: 09/02/14  2:23 PM  Result Value Ref Range Status   Specimen Description URINE, CATHETERIZED  Final   Special Requests NONE  Final   Culture   Final    NO GROWTH 1 DAY Performed at [facility]    Report Status 09/03/2014 FINAL  Final  MRSA PCR Screening     Status: None   Collection Time: 09/02/14  5:01 PM  Result Value Ref Range Status   MRSA by PCR NEGATIVE NEGATIVE Final    Comment:        The GeneXpert MRSA Assay (FDA approved for NASAL specimens only), is one component of a comprehensive MRSA colonization surveillance program. It is not intended to diagnose MRSA infection nor to guide or monitor treatment for MRSA infections.   Culture, respiratory (NON-Expectorated)     Status: None   Collection Time: 09/04/14  8:31 AM  Result Value Ref Range Status   Specimen Description TRACHEAL  ASPIRATE  Final   Special Requests Normal  Final   Gram Stain   Final    FEW WBC PRESENT,BOTH PMN AND MONONUCLEAR RARE SQUAMOUS EPITHELIAL CELLS PRESENT RARE YEAST Performed at [facility]    Culture   Final    MODERATE YEAST CONSISTENT WITH CANDIDA SPECIES Performed at [facility]    Report Status 09/07/2014 FINAL  Final  Culture, routine-abscess     Status: None   Collection Time: 09/06/14 12:22 PM  Result Value Ref Range Status   Specimen Description LIVER  Final   Special Requests NONE  Final   Gram Stain   Final    MODERATE WBC PRESENT,BOTH PMN AND MONONUCLEAR NO SQUAMOUS EPITHELIAL CELLS SEEN FEW GRAM POSITIVE COCCI IN CLUSTERS IN CHAINS Performed at [facility]    Culture   Final    MODERATE KLEBSIELLA PNEUMONIAE ABUNDANT ENTEROCOCCUS SPECIES Performed at [facility]    Report Status 09/10/2014 FINAL  Final   Organism ID, Bacteria KLEBSIELLA PNEUMONIAE  Final   Organism ID, Bacteria ENTEROCOCCUS SPECIES  Final      Susceptibility   Klebsiella pneumoniae - MIC*    AMPICILLIN RESISTANT      AMPICILLIN/SULBACTAM 8 SENSITIVE Sensitive     CEFAZOLIN <=4 SENSITIVE Sensitive     CEFEPIME <=1 SENSITIVE Sensitive     CEFTAZIDIME <=1 SENSITIVE Sensitive     CEFTRIAXONE <=1 SENSITIVE Sensitive     CIPROFLOXACIN >=4 RESISTANT Resistant     GENTAMICIN <=1 SENSITIVE Sensitive     IMIPENEM <=0.25 SENSITIVE Sensitive     PIP/TAZO 8 SENSITIVE Sensitive     TOBRAMYCIN <=1 SENSITIVE Sensitive     TRIMETH/SULFA <=20 SENSITIVE Sensitive     * MODERATE KLEBSIELLA PNEUMONIAE   Enterococcus species - MIC*    VANCOMYCIN <=0.5 SENSITIVE Sensitive     AMPICILLIN >=32 RESISTANT Resistant     *  ABUNDANT ENTEROCOCCUS SPECIES  Culture, routine-abscess     Status: None   Collection Time: 09/06/14 12:22 PM  Result Value Ref Range Status   Specimen Description LIVER RIGHT LIVER LOBE ABSCESS  Final   Special Requests NONE  Final   Gram Stain   Final     NO WBC SEEN NO SQUAMOUS EPITHELIAL CELLS SEEN NO ORGANISMS SEEN Performed at [facility]    Culture   Final    NO GROWTH 3 DAYS Performed at [facility]    Report Status 09/09/2014 FINAL  Final  AFB culture with smear     Status: None (Preliminary result)   Collection Time: 09/06/14 12:26 PM  Result Value Ref Range Status   Specimen Description PERITONEAL CAVITY  Final   Special Requests NONE  Final   Acid Fast Smear   Final    NO ACID FAST BACILLI SEEN Performed at [facility]    Culture   Final    CULTURE WILL BE EXAMINED FOR 6 WEEKS BEFORE ISSUING A FINAL REPORT Performed at [facility]    Report Status PENDING  Incomplete  Culture, body fluid-bottle     Status: None (Preliminary result)   Collection Time: 09/06/14 12:26 PM  Result Value Ref Range Status   Specimen Description PERITONEAL FLUID  Final   Special Requests NONE  Final   Gram Stain   Final    WBC PRESENT, PREDOMINANTLY PMN NO ORGANISMS SEEN CONFIRMED BY K. WOOTEN    Culture   Final    NO GROWTH 4 DAYS Performed at [facility]    Report Status PENDING  Incomplete  Gram stain     Status: None   Collection Time: 09/06/14 12:26 PM  Result Value Ref Range Status   Specimen Description PERITONEAL  Final   Special Requests NONE  Final   Gram Stain   Final    WBC PRESENT, PREDOMINANTLY PMN NO ORGANISMS SEEN CONFIRMED BY Louie Casa Performed at [facility]    Report Status 09/07/2014 FINAL  Final  Anaerobic culture     Status: None (Preliminary result)   Collection Time: 09/06/14 12:36 PM  Result Value Ref Range Status   Specimen Description PERITONEAL CAVITY  Final   Special Requests NONE  Final   Gram Stain   Final    NO WBC SEEN NO SQUAMOUS EPITHELIAL CELLS SEEN NO ORGANISMS SEEN Performed at [facility]    Culture   Final    NO ANAEROBES ISOLATED; CULTURE IN PROGRESS FOR 5 DAYS Performed at [facility]    Report Status  PENDING  Incomplete  Culture, blood (routine x 2)     Status: None (Preliminary result)   Collection Time: 09/09/14 10:05 AM  Result Value Ref Range Status   Specimen Description BLOOD LEFT ARM  Final   Special Requests BOTTLES DRAWN AEROBIC ONLY 2CC  Final   Culture   Final    NO GROWTH 1 DAY Performed at [facility]    Report Status PENDING  Incomplete  Culture, blood (routine x 2)     Status: None (Preliminary result)   Collection Time: 09/09/14 10:10 AM  Result Value Ref Range Status   Specimen Description BLOOD RIGHT HAND  Final   Special Requests BOTTLES DRAWN AEROBIC ONLY [place]  Final   Culture   Final    NO GROWTH 1 DAY Performed at [facility]    Report Status PENDING  Incomplete  Studies: No results found.  Scheduled Meds: . sodium chloride   Intravenous Once  . ertapenem  1 g Intravenous Q24H  . furosemide  40 mg Oral Daily  . lactose free nutrition  237 mL Oral BID BM  . lactulose  30 g Oral BID  . linezolid  600 mg Oral Q12H  . nadolol  10 mg Oral Daily  . pantoprazole  40 mg Oral BID  . rifaximin  550 mg Oral BID  . spironolactone  50 mg Oral Daily  . sucralfate  1 g Oral TID WC & HS   Continuous Infusions:   Time spent: > 35 minutes  Velvet Bathe  [facility] Pager [phone] If 7PM-7AM, please contact night-coverage at [email or web address], password [facility] 09/10/2014, 2:20 PM  LOS: 8 days

## 2014-09-10 NOTE — Progress Notes (Signed)
Referring Physician(s): CCM  Subjective: Patient with c/o soreness at drain sites especially with movement. She states this is similar to day before and is not worsening. She denies fever or chills. She is tolerating diet without N/V.  Allergies: Rocephin; Vancomycin; Zosyn; Amoxicillin; and Sulfa antibiotics  Medications: Prior to Admission medications   Medication Sig Start Date End Date Taking? Authorizing Provider  ciprofloxacin (CIPRO) 500 MG tablet Take 1 tablet (500 mg total) by mouth daily with breakfast. 02/12/14  Yes Robbie Lis, MD  furosemide (LASIX) 40 MG tablet Take 40 mg by mouth daily. 08/04/14  Yes Historical Provider, MD  lactulose (CHRONULAC) 10 GM/15ML solution Take 45 mLs (30 g total) by mouth 2 (two) times daily. Patient taking differently: Take 30 g by mouth 3 (three) times daily.  04/04/14  Yes Bonnielee Haff, MD  metoCLOPramide (REGLAN) 5 MG tablet Take 5 mg by mouth 4 (four) times daily as needed for nausea (nausea).   Yes Historical Provider, MD  nadolol (CORGARD) 20 MG tablet Take 10 mg by mouth daily.   Yes Historical Provider, MD  ondansetron (ZOFRAN-ODT) 4 MG disintegrating tablet Take 4 mg by mouth at bedtime as needed for nausea.  06/10/14  Yes Historical Provider, MD  pantoprazole (PROTONIX) 40 MG tablet Take 1 tablet (40 mg total) by mouth every 12 (twelve) hours. 02/12/14  Yes Robbie Lis, MD  rifaximin (XIFAXAN) 550 MG TABS tablet Take 1 tablet (550 mg total) by mouth 2 (two) times daily. 02/12/14  Yes Robbie Lis, MD  spironolactone (ALDACTONE) 50 MG tablet Take 1 tablet (50 mg total) by mouth daily. 02/12/14 02/12/15 Yes Robbie Lis, MD  sucralfate (CARAFATE) 1 GM/10ML suspension Take 10 mLs (1 g total) by mouth 3 (three) times daily with meals. 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  She presents on this hospital admission with evidence of abscess in the right liver lobe.  She also has ascitic fluid surrounding the liver, and she has been referred for abscess drainage and diagnostic paracentesis.  EXAM: ULTRASOUND GUIDED DRAINAGE OF RIGHT LIVER LOBE ABSCESS  ULTRASOUND-GUIDED PARACENTESIS FOR DIAGNOSTIC PURPOSE  MEDICATIONS: 1.0 mg IV Versed; 50 mcg IV Fentanyl  Total Moderate Sedation Time: 37  PROCEDURE: The procedure, risks, benefits, and alternatives were explained to the patient. Questions regarding the procedure were encouraged and answered. The patient understands and consents to the procedure.  Ultrasound survey of the right upper quadrant was performed with images stored and sent to PACs.  The right upper quad was prepped with Betadine in a sterile fashion, and a sterile drape was applied covering the operative field. A sterile gown and sterile gloves were used for the procedure. Local anesthesia was provided with 1% Lidocaine.  Once the patient is prepped and draped in the usual sterile fashion, the skin and subcutaneous tissues were generously infiltrated with  1% lidocaine for local anesthesia. A small stab incision was made with 11 blade scalpel, and then trocar technique was used to advance a 10 [place] drain in an intercostal location into gas and fluid collection at the dome of the liver.  The drain was advanced into this collection and the pigtail catheter was formed. Combination of gas and brown bilious fluid were aspirated. Catheter was fixed to  the skin with a StatLock.  A second collection at the inferior aspect the right liver lobe was targeted. The skin and subcutaneous tissues were generously infiltrated with 1% lidocaine, and a 10 French drain was advanced into this collection with trocar technique. Pigtail catheter was formed and serosanguineous fluid was aspirated from this collection. This drain was anchored to the skin with a StatLock.  Finally, the ascitic fluid in the right upper quadrant was then addressed by advancing yueh catheter under ultrasound guidance after anesthesia of the skin and subcutaneous tissues with 1% lidocaine.  A total of 430 cc of ascitic fluid was aspirated.  The superior drain was attached to gravity drainage, draining the dome of the liver abscess, and the inferior drain was attached to gravity drainage, draining a right liver lobe lesion.  Patient tolerated the procedure well and remained hemodynamically stable throughout.  No complications were encountered and no significant blood loss was encountered.  COMPLICATIONS: None.  FINDINGS: Ultrasound survey demonstrates gas and fluid collection at the liver dome. In addition there is a smaller collection more inferiorly along the inferior right liver lobe.  Ascitic fluid of the right upper quadrant was also identified by ultrasound survey.  Final images demonstrate drain within the dome of the abscess attached to gravity drain, which is the superiorly placed drain in the right upper quadrant.  The inferiorly placed drain is within the more inferior right liver lobe collection.  IMPRESSION: Status post ultrasound-guided drainage of 2 right-sided gas and fluid collections. Sample sent to the lab from each collection.  Status post ultrasound-guided diagnostic paracentesis.  Signed,  Dulcy Fanny. Earleen Newport, DO  [facility]  [facility]  PLAN: The drain that is cranially placed/superior drains the larger gas and fluid collection of the  liver dome. This was labeled drain 1.  The drain that is caudally placed/inferior drains the smaller fluid collection at the right liver. This was labeled drain 2.   Electronically Signed   By: Corrie Mckusick D.O.   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  She presents on this hospital admission with evidence of abscess in the right liver lobe.  She also has ascitic fluid surrounding the liver, and she has been referred for abscess drainage and diagnostic paracentesis.  EXAM: ULTRASOUND GUIDED DRAINAGE OF RIGHT LIVER LOBE ABSCESS  ULTRASOUND-GUIDED PARACENTESIS FOR DIAGNOSTIC PURPOSE  MEDICATIONS: 1.0 mg IV Versed; 50 mcg IV Fentanyl  Total Moderate Sedation Time: 37  PROCEDURE: The procedure, risks, benefits, and alternatives were explained to the patient. Questions regarding the procedure were encouraged and answered. The patient understands and consents to the procedure.  Ultrasound survey of the right upper quadrant was performed with images stored and sent to PACs.  The right upper quad was prepped with Betadine in a sterile fashion, and a sterile drape was applied covering the operative field. A sterile gown and sterile gloves were used for the procedure. Local anesthesia  was provided with 1% Lidocaine.  Once the patient is prepped and draped in the usual sterile fashion, the skin and subcutaneous tissues were generously infiltrated with 1% lidocaine for local anesthesia. A small stab incision was made with 11 blade scalpel, and then trocar technique was used to advance a 10 [place] drain in an intercostal location into gas and fluid collection at the dome of the liver.  The drain was advanced into this collection and the pigtail catheter was formed. Combination of gas and brown bilious fluid were aspirated. Catheter was fixed to the skin with a  StatLock.  A second collection at the inferior aspect the right liver lobe was targeted. The skin and subcutaneous tissues were generously infiltrated with 1% lidocaine, and a 10 French drain was advanced into this collection with trocar technique. Pigtail catheter was formed and serosanguineous fluid was aspirated from this collection. This drain was anchored to the skin with a StatLock.  Finally, the ascitic fluid in the right upper quadrant was then addressed by advancing yueh catheter under ultrasound guidance after anesthesia of the skin and subcutaneous tissues with 1% lidocaine.  A total of 430 cc of ascitic fluid was aspirated.  The superior drain was attached to gravity drainage, draining the dome of the liver abscess, and the inferior drain was attached to gravity drainage, draining a right liver lobe lesion.  Patient tolerated the procedure well and remained hemodynamically stable throughout.  No complications were encountered and no significant blood loss was encountered.  COMPLICATIONS: None.  FINDINGS: Ultrasound survey demonstrates gas and fluid collection at the liver dome. In addition there is a smaller collection more inferiorly along the inferior right liver lobe.  Ascitic fluid of the right upper quadrant was also identified by ultrasound survey.  Final images demonstrate drain within the dome of the abscess attached to gravity drain, which is the superiorly placed drain in the right upper quadrant.  The inferiorly placed drain is within the more inferior right liver lobe collection.  IMPRESSION: Status post ultrasound-guided drainage of 2 right-sided gas and fluid collections. Sample sent to the lab from each collection.  Status post ultrasound-guided diagnostic paracentesis.  Signed,  Dulcy Fanny. Earleen Newport, DO  [facility]  [facility]  PLAN: The drain that is cranially placed/superior drains the larger gas and fluid collection of the liver dome. This  was labeled drain 1.  The drain that is caudally placed/inferior drains the smaller fluid collection at the right liver. This was labeled drain 2.   Electronically Signed   By: Corrie Mckusick D.O.   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  She presents on this hospital admission with evidence of abscess in the right liver lobe.  She also has ascitic fluid surrounding the liver, and she has been referred for abscess drainage and diagnostic paracentesis.  EXAM: ULTRASOUND GUIDED DRAINAGE OF RIGHT LIVER LOBE ABSCESS  ULTRASOUND-GUIDED PARACENTESIS FOR DIAGNOSTIC PURPOSE  MEDICATIONS: 1.0 mg IV Versed; 50 mcg IV Fentanyl  Total Moderate Sedation Time: 37  PROCEDURE: The procedure, risks, benefits, and alternatives were explained to the patient. Questions regarding the procedure were encouraged and answered. The patient understands and consents to the procedure.  Ultrasound survey of the right upper quadrant was performed with images stored and sent to PACs.  The right upper quad was prepped with Betadine  in a sterile fashion, and a sterile drape was applied covering the operative field. A sterile gown and sterile gloves were used for the procedure. Local anesthesia was provided with 1% Lidocaine.  Once the patient is prepped and draped in the usual sterile fashion, the skin and subcutaneous tissues were generously infiltrated with 1% lidocaine for local anesthesia. A small stab incision was made with 11 blade scalpel, and then trocar technique was used to advance a 10 [place] drain in an intercostal location into gas and fluid collection at the dome of the liver.  The drain was advanced into this collection and the pigtail catheter was formed. Combination of gas and brown bilious fluid were aspirated. Catheter was fixed to the skin with a StatLock.  A second  collection at the inferior aspect the right liver lobe was targeted. The skin and subcutaneous tissues were generously infiltrated with 1% lidocaine, and a 10 French drain was advanced into this collection with trocar technique. Pigtail catheter was formed and serosanguineous fluid was aspirated from this collection. This drain was anchored to the skin with a StatLock.  Finally, the ascitic fluid in the right upper quadrant was then addressed by advancing yueh catheter under ultrasound guidance after anesthesia of the skin and subcutaneous tissues with 1% lidocaine.  A total of 430 cc of ascitic fluid was aspirated.  The superior drain was attached to gravity drainage, draining the dome of the liver abscess, and the inferior drain was attached to gravity drainage, draining a right liver lobe lesion.  Patient tolerated the procedure well and remained hemodynamically stable throughout.  No complications were encountered and no significant blood loss was encountered.  COMPLICATIONS: None.  FINDINGS: Ultrasound survey demonstrates gas and fluid collection at the liver dome. In addition there is a smaller collection more inferiorly along the inferior right liver lobe.  Ascitic fluid of the right upper quadrant was also identified by ultrasound survey.  Final images demonstrate drain within the dome of the abscess attached to gravity drain, which is the superiorly placed drain in the right upper quadrant.  The inferiorly placed drain is within the more inferior right liver lobe collection.  IMPRESSION: Status post ultrasound-guided drainage of 2 right-sided gas and fluid collections. Sample sent to the lab from each collection.  Status post ultrasound-guided diagnostic paracentesis.  Signed,  Dulcy Fanny. Earleen Newport, DO  [facility]  [facility]  PLAN: The drain that is cranially placed/superior drains the larger gas and fluid collection of the liver dome. This was labeled drain 1.   The drain that is caudally placed/inferior drains the smaller fluid collection at the right liver. This was labeled drain 2.   Electronically Signed   By: Corrie Mckusick D.O.   On: 09/09/2014 08:40    Labs:  CBC:  Recent Labs  09/07/14 0600 09/08/14 0420 09/09/14 0545 09/10/14 0312 09/10/14 0805  WBC 9.3 9.3 11.2* 10.5  --   HGB 7.9* 8.7* 7.4* 6.9* 6.8*  HCT 23.8* 27.4* 22.1* 20.8* 20.8*  PLT 94* 71* 100* 109*  --     COAGS:  Recent Labs  02/06/14 1444  09/05/14 0400 09/06/14 0427 09/08/14 0420 09/09/14 0545  INR 1.04  < > 1.30 1.33 1.46 1.34  APTT 21*  --  34 31 36  --   < > = values in this interval not displayed.  BMP:  Recent Labs  09/07/14 0600 09/08/14 0420 09/09/14 0545 09/10/14 0312  NA 137 140 136 135  K  3.7 3.5 3.0* 3.8  CL 113* 116* 111 108  CO2 19* 20* 20* 23  GLUCOSE 105* 107* 90 100*  BUN 56* 41* 29* 26*  CALCIUM 7.4* 7.4* 7.4* 7.4*  CREATININE 1.03* 0.96 0.95 0.99  GFRNONAA 57* >60 >60 >60  GFRAA >60 >60 >60 >60    LIVER FUNCTION TESTS:  Recent Labs  09/05/14 0400 09/06/14 0427 09/08/14 0420 09/09/14 0545  BILITOT 2.3* 2.6* 2.6* 3.0*  AST 158* 189* 123* 102*  ALT 87* 97* 72* 60*  ALKPHOS 586* 778* 663* 675*  PROT 4.9* 5.0* 4.8* 5.0*  ALBUMIN 1.6* 1.6* 1.5*  1.5* 1.6*    Assessment and Plan: Cholangiocarcinoma with history of partial hepatic resection in 04/2014 Septic shock Hepatic abscess S/p (2) Perc drains placed 7/16, good output, wbc trend down today-afebrile Discussed case with Dr. Earleen Newport and lower RUQ drain around right lobe of liver light bloody output 10cc/24 hrs, Cx with no growth and small amount. Ok to remove today without repeat imaging per Dr. Earleen Newport, successful removal without immediate complications. Will continue RUQ dome of liver drain with (+) Cx-will need to keep in at least 2 weeks with BID flushes at home and follow up at IR drain clinic with repeat imaging. Continue drain flushes and monitor daily  output Plans per GI/Primary team   Signed: Hedy Jacob 09/10/2014, 11:29 AM   I spent a total of 15 Minutes in face to face in clinical consultation/evaluation, greater than 50% of which was counseling/coordinating care for hepatic abscess.

## 2014-09-11 LAB — ANAEROBIC CULTURE: Gram Stain: NONE SEEN

## 2014-09-11 LAB — CULTURE, BODY FLUID-BOTTLE: CULTURE: NO GROWTH

## 2014-09-11 LAB — CBC
HCT: 24.6 % — ABNORMAL LOW (ref 36.0–46.0)
Hemoglobin: 8.2 g/dL — ABNORMAL LOW (ref 12.0–15.0)
MCH: 29 pg (ref 26.0–34.0)
MCHC: 33.3 g/dL (ref 30.0–36.0)
MCV: 86.9 fL (ref 78.0–100.0)
PLATELETS: 111 10*3/uL — AB (ref 150–400)
RBC: 2.83 MIL/uL — AB (ref 3.87–5.11)
RDW: 17 % — AB (ref 11.5–15.5)
WBC: 10.8 10*3/uL — AB (ref 4.0–10.5)

## 2014-09-11 LAB — CULTURE, BODY FLUID W GRAM STAIN -BOTTLE

## 2014-09-11 MED ORDER — HEPARIN SOD (PORK) LOCK FLUSH 100 UNIT/ML IV SOLN
500.0000 [IU] | INTRAVENOUS | Status: DC | PRN
  Administered 2014-09-11: 500 [IU]

## 2014-09-11 MED ORDER — SODIUM CHLORIDE 0.9 % IV SOLN: 1.0000 g | INTRAVENOUS | Status: AC

## 2014-09-11 MED ORDER — HYDROCODONE-ACETAMINOPHEN 5-325 MG PO TABS: 1.0000 | ORAL_TABLET | Freq: Four times a day (QID) | ORAL | Status: AC | PRN

## 2014-09-11 MED ORDER — LINEZOLID 600 MG PO TABS: 600.0000 mg | ORAL_TABLET | Freq: Two times a day (BID) | ORAL | Status: DC

## 2014-09-11 MED ORDER — SUCRALFATE 1 GM/10ML PO SUSP: 1.0000 g | Freq: Three times a day (TID) | ORAL | Status: AC

## 2014-09-11 MED ORDER — HEPARIN SOD (PORK) LOCK FLUSH 100 UNIT/ML IV SOLN: 500.0000 [IU] | INTRAVENOUS | Status: DC

## 2014-09-11 NOTE — Progress Notes (Signed)
PT DC home with spouse and home health. She was provided with prescriptions, education, and instructions. Pt was taught how to care for drain by flushing it BID and emptying drain. She was DC with port a cath to receive home IV antibiotics. Pt and spouse both says they have an understanding and taught back how to provide care for drain. She was transferred out via [place] accompanied by staff.

## 2014-09-11 NOTE — Discharge Summary (Addendum)
Physician Discharge Summary  ZOIEY CHRISTY WIO:[id number] DOB: Dec 14, 1955 DOA: 09/02/2014  PCP: Cari Caraway, MD  Admit date: 09/02/2014 Discharge date: 09/12/2014  Time spent: > Joann minutes  Recommendations for Outpatient Follow-up:  1. Monitor hemoglobin levels 2. We'll set up appointment for patient to follow-up with infectious disease within one week so that they can give further recommendations regarding length of therapy for antibiotics 3. Patient will need to follow-up with radiology group here at [facility] long to assess drain and when to discontinue  Discharge Diagnoses:  Principal Problem:   Hypovolemic shock Active Problems:   Acute upper GI bleed   Hypokalemia   Acute blood loss anemia   Septic shock   Hepatic abscess   Bacteremia   Discharge Condition: stable  Diet recommendation: regular diet  Filed Weights   09/09/14 0448 09/10/14 0458 09/11/14 0432  Weight: 66.271 kg (146 lb 1.6 oz) 65.273 kg (143 lb 14.4 oz) 65.4 kg (144 lb 2.9 oz)    History of present illness:  Joann Castaneda is a 59 y.o. 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 She had been on chemo (only 2 doses) and radiation from September to October and developed radiation induced gastritis. EGD in April showed grade 1 varices and improvement in duodenitis and gastritis felt second to be prior to previous radiation.  She has portal HTN, ascites, hepatic encephalopathy..  She was admitted to the ICU after an episode of hematemesis on 7/12. She had further hematemesis in the ER and was hypotensive with SBP in 90s, lactic acid was 10, Hb dropped from 10 to 7.5. She was started on Octreotide, Protinx, given Vit K and given 2 UPRBC and intubated for airway protection.   EGD on 7/13  reveals : small gastric ulcer with some stigmata of hemorrhage, no active bleeding no significant blood in the stomach. Small nonbleeding esophageal varices. Antral gastritis and duodenitis possibly from previous radiation.   Hospital Course:  Hypovolemic shock - Resolved , improved with antibiotics and placement of drains. One drain has been removed. Other drain is in place  Bacteremia gram-negative rods with Klebsiella species - Plan is for patient to follow-up with ID for further recommendations on antibiotics regimen length of therapy.  Anemia - EGD on 7/13 revealed small gastric ulcer with some stigmata of hemorrhage and no active bleeding. I suspect worsening anemia as to secondary to recent bleeding as well as sequelae of bacteremia - Improved after transfusion. Recommend reassessing within one week after discharge  Active Problems:  Hypokalemia -Resolved on last check. 3.8   Hepatic abscess - Drain in place, other drain pulled. Plan is for IR to reevaluate after patient is discharged. Length of IV antibiotics will most likely be affected by length of time drain is in place, we'll defer to ID and have our secretary set up outpatient appointment.  Procedures: RUL ultrasound 7/12 revealed complex cysts in the liver CT abd pelvis on 7/13 revealed 3 cysts in the right lobe of liver ? Abscess 7/14- pressors stopped / extubated 7/16 under when IR guided drain placement in liver- 2 drains Blood culture returned positive for Klebsiella- Abscess culture still pending  Consultations:  Radiology  Gastroenterology  Discharge Exam: Filed Vitals:   09/11/14 1501  BP: 99/63  Pulse: 82  Temp: 97.8 F (36.6 C)  Resp: 20    General: Pt  in nad, alert and awake Cardiovascular: rrr, no mrg Respiratory: cta bl, no wheezes  Discharge Instructions   Discharge Instructions    Call MD for:  redness, tenderness, or signs of infection (pain, swelling, redness, odor or green/yellow  discharge around incision site)    Complete by:  As directed      Call MD for:  temperature >100.4    Complete by:  As directed      Diet - low sodium heart healthy    Complete by:  As directed      Discharge instructions    Complete by:  As directed   Discharge home with home health and plans for patient to f/u with infectious disease specialist in 1 week.     Increase activity slowly    Complete by:  As directed           Discharge Medication List as of 09/11/2014  4:17 PM    START taking these medications   Details  ertapenem 1 g in sodium chloride 0.9 % 50 mL Inject 1 g into the vein daily., Starting 09/11/2014, Until Discontinued, Print    HYDROcodone-acetaminophen (NORCO/VICODIN) 5-325 MG per tablet Take 1-2 tablets by mouth every 6 (six) hours as needed for moderate pain., Starting 09/11/2014, Until Discontinued, Print    linezolid (ZYVOX) 600 MG tablet Take 1 tablet (600 mg total) by mouth every 12 (twelve) hours., Starting 09/11/2014, Until Discontinued, Print      CONTINUE these medications which have CHANGED   Details  sucralfate (CARAFATE) 1 GM/10ML suspension Take 10 mLs (1 g total) by mouth 4 (four) times daily -  with meals and at bedtime., Starting 09/11/2014, Until Discontinued, Normal      CONTINUE these medications which have NOT CHANGED   Details  furosemide (LASIX) 40 MG tablet Take 40 mg by mouth daily., Starting 08/04/2014, Until Discontinued, Historical Med    lactulose (CHRONULAC) 10 GM/15ML solution Take 45 mLs (30 g total) by mouth 2 (two) times daily., Starting 04/04/2014, Until Discontinued, Print    metoCLOPramide (REGLAN) 5 MG tablet Take 5 mg by mouth 4 (four) times daily as needed for nausea (nausea)., Until Discontinued, Historical Med    nadolol (CORGARD) 20 MG tablet Take 10 mg by mouth daily., Until Discontinued, Historical Med    ondansetron (ZOFRAN-ODT) 4 MG disintegrating tablet Take 4 mg by mouth at bedtime as needed for nausea. , Starting  06/10/2014, Until Discontinued, Historical Med    pantoprazole (PROTONIX) 40 MG tablet Take 1 tablet (40 mg total) by mouth every 12 (twelve) hours., Starting 02/12/2014, Until Discontinued, Print    rifaximin (XIFAXAN) 550 MG TABS tablet Take 1 tablet (550 mg total) by mouth 2 (two) times daily., Starting 02/12/2014, Until Discontinued, Print    spironolactone (ALDACTONE) 50 MG tablet Take 1 tablet (50 mg total) by mouth daily., Starting 02/12/2014, Until Thu 02/12/15, Print      STOP taking these medications     ciprofloxacin (CIPRO) 500 MG tablet        Allergies  Allergen Reactions  . Rocephin [Ceftriaxone] Other (See Comments)    Dress syndrome  . Vancomycin Other (See Comments)    Dress syndrome.   Marland Kitchen Zosyn [Piperacillin Sod-Tazobactam So]     Dress syndrome - TOLERATES PRIMAXIN  . Amoxicillin Rash  . Sulfa Antibiotics Rash   Follow-up Information    Follow up with [facility], MD. Go on 09/19/2014.   Specialty:  Family Medicine   Why:  at 1:30pm  For Post Hospitalization Follow Up, Arrive 15 minutes prior to appointment   Contact information:   [place] [id number] [phone]       Follow up with Carlyle Basques, MD. Go on 09/30/2014.   Specialty:  Infectious Diseases   Why:  at 11:15am   For Post Hospitalization Follow Up, Arrive 15 minutes prior to appointment, Bring Insurance & Phote ID, Bring Co-pay   Contact information:   [address] [place] [id number] [phone]        The results of significant diagnostics from this hospitalization (including imaging, microbiology, ancillary and laboratory) are listed below for reference.    Significant Diagnostic Studies: Ct Abdomen Pelvis Wo Contrast  09/03/2014   CLINICAL DATA:  Right upper quadrant pain for several days. Diarrhea. Hematemesis. Surgical Castaneda of Castaneda approximately 4 months ago.  EXAM: CT ABDOMEN AND PELVIS WITHOUT CONTRAST  TECHNIQUE:  Multidetector CT imaging of the abdomen and pelvis was performed following the standard protocol without IV contrast.  COMPARISON:  Ultrasound on 09/02/2014 and MRI on 03/23/2013  FINDINGS: Lower chest: Small bilateral pleural effusions and bibasilar atelectasis noted. Small hiatal hernia also demonstrated.  Hepatobiliary: Surgical clips are seen from previous left hepatectomy. 2 cystic lesions containing air-fluid levels are seen in the dome of the right hepatic lobe which measure approximately 3.5 and 5.0 cm. A third low-attenuation lesion containing small amount of internal areas seen in the inferior right hepatic lobe which measures 2.4 cm. These are suspicious for hepatic abscesses. Mild pneumobilia also noted.  Pancreas: No mass or inflammatory process visualized on this unenhanced exam.  Spleen: Mild splenomegaly, with spleen measuring approximately 13 cm in length.  Adrenal Glands:  No masses identified.  Kidneys/Urinary tract: No evidence of urolithiasis or hydronephrosis. Foley catheter seen within the bladder.  Stomach/Bowel/Peritoneum: Moderate ascites is seen within the abdomen and pelvis. Diffuse gastric, small bowel and colonic wall thickening is seen. No evidence of bowel obstruction.  Vascular/Lymphatic: No pathologically enlarged lymph nodes identified. Venous collaterals are seen in the gastrosplenic ligament, consistent with portal venous hypertension.  Reproductive:  No mass or other significant abnormality noted.  Other:  None.  Musculoskeletal:  No suspicious bone lesions identified.  IMPRESSION: Three cystic lesions of the right hepatic lobe measuring up to 5 cm which contain internal gas, and are suspicious for hepatic abscesses.  Diffuse gastric, small bowel, and colonic wall thickening. Differential diagnosis includes hypoalbuminemia and infectious or inflammatory etiologies.  Moderate ascites, small bilateral pleural effusions, and bibasilar atelectasis.  Splenomegaly and gastrohepatic  ligament venous collaterals, consistent with portal venous hypertension.   Electronically Signed   By: Earle Gell M.D.   On: 09/03/2014 14:05   Dg Abd 1 View  09/03/2014   CLINICAL DATA:  NG tube placement.  Hematemesis tonight.  EXAM: ABDOMEN - 1 VIEW  COMPARISON:  None.  FINDINGS: Enteric tube tip is in the left upper quadrant consistent with location in the upper stomach. Surgical clips in the right upper quadrant. No small or large bowel dilatation.  IMPRESSION: Enteric tube tip is in the left upper quadrant consistent with location in the upper stomach.   Electronically Signed   By: Lucienne Capers M.D.   On: 09/03/2014 01:41   US Paracentesis  09/09/2014   CLINICAL DATA:  59 year old female with a history of Castaneda, Joann Castaneda.  She presents on this hospital admission with evidence of abscess in the right liver lobe.  She also has ascitic fluid surrounding the liver, and she has been referred for abscess drainage and diagnostic paracentesis.  EXAM: ULTRASOUND GUIDED DRAINAGE OF RIGHT LIVER LOBE ABSCESS  ULTRASOUND-GUIDED PARACENTESIS FOR DIAGNOSTIC PURPOSE  MEDICATIONS: 1.0 mg IV Versed; 50 mcg IV Fentanyl  Total Moderate Sedation Time: 37  PROCEDURE: The procedure, risks, benefits, and alternatives were explained to the patient. Questions regarding the procedure were encouraged and answered. The patient understands and consents to the procedure.  Ultrasound survey of the right upper quadrant was performed with images stored and sent to PACs.  The right upper quad was prepped with Betadine in a sterile fashion, and a sterile drape was applied covering the operative field. A sterile gown and sterile gloves were used for the procedure. Local anesthesia was provided with 1% Lidocaine.  Once the patient is prepped and draped in the usual sterile fashion, the skin and subcutaneous tissues were generously infiltrated with 1% lidocaine for local anesthesia. A small stab  incision was made with 11 blade scalpel, and then trocar technique was used to advance a 10 [place] drain in an intercostal location into gas and fluid collection at the dome of the liver.  The drain was advanced into this collection and the pigtail catheter was formed. Combination of gas and brown bilious fluid were aspirated. Catheter was fixed to the skin with a StatLock.  A second collection at the inferior aspect the right liver lobe was targeted. The skin and subcutaneous tissues were generously infiltrated with 1% lidocaine, and a 10 French drain was advanced into this collection with trocar technique. Pigtail catheter was formed and serosanguineous fluid was aspirated from this collection. This drain was anchored to the skin with a StatLock.  Finally, the ascitic fluid in the right upper quadrant was then addressed by advancing yueh catheter under ultrasound guidance after anesthesia of the skin and subcutaneous tissues with 1% lidocaine.  A total of 430 cc of ascitic fluid was aspirated.  The superior drain was attached to gravity drainage, draining the dome of the liver abscess, and the inferior drain was attached to gravity drainage, draining a right liver lobe lesion.  Patient tolerated the procedure well and remained hemodynamically stable throughout.  No complications were encountered and no significant blood loss was encountered.  COMPLICATIONS: None.  FINDINGS: Ultrasound survey demonstrates gas and fluid collection at the liver dome. In addition there is a smaller collection more inferiorly along the inferior right liver lobe.  Ascitic fluid of the right upper quadrant was also identified by ultrasound survey.  Final images demonstrate drain within the dome of the abscess attached to gravity drain, which is the superiorly placed drain in the right upper quadrant.  The inferiorly placed drain is within the more inferior right liver lobe collection.  IMPRESSION: Joann post ultrasound-guided drainage  of 2 right-sided gas and fluid collections. Sample sent to the lab from each collection.  Joann post ultrasound-guided diagnostic paracentesis.  Signed,  Dulcy Fanny. Earleen Newport, DO  [facility]  [facility]  PLAN: The drain that is cranially placed/superior drains the larger gas and fluid collection of the liver dome. This was labeled drain 1.  The drain that is caudally placed/inferior drains the smaller fluid collection at the right liver. This was labeled drain 2.   Electronically Signed   By: Corrie Mckusick D.O.   On: 09/09/2014 08:40   Dg Chest Port 1 View  09/05/2014   CLINICAL DATA:  Respiratory failure.  Endotracheal tube  removed  EXAM: PORTABLE CHEST - 1 VIEW  COMPARISON:  September 04, 2014  FINDINGS: Endotracheal tube and nasogastric tube have been removed. Left jugular catheter tip is in the superior vena cava. Port-A-Cath tip is in the superior cava near the cavoatrial junction. No pneumothorax. There is stable underlying interstitial edema. The previously and right lower lobe consolidation has essentially completely resolved. No new opacity. Heart size and pulmonary vascularity are normal. No adenopathy.  IMPRESSION: Stable underlying interstitial edema. No airspace consolidation. Catheter positions as described without pneumothorax.   Electronically Signed   By: Lowella Grip III M.D.   On: 09/05/2014 07:10   Dg Chest Port 1 View  09/04/2014   CLINICAL DATA:  Intubation.  EXAM: PORTABLE CHEST - 1 VIEW  COMPARISON:  None.  FINDINGS: Endotracheal tube, left IJ line, and NG tube in good anatomic position. Power port catheter in good anatomic position. Mediastinum hilar structures normal. Heart size normal. Partial clearing of right infrahilar/right lower lower lobe infiltrate. No pleural effusion or pneumothorax.  IMPRESSION: 1. Lines and tubes in stable position. 2. Partial clearing of right infrahilar/right lower lobe infiltrate.   Electronically Signed    By: Marcello Moores  Register   On: 09/04/2014 07:08   Dg Chest Port 1 View  09/03/2014   CLINICAL DATA:  Hypoxia.  History of Castaneda  EXAM: PORTABLE CHEST - 1 VIEW  COMPARISON:  Study obtained earlier in the day  FINDINGS: Endotracheal tube tip is 4.4 cm above the carina. Left jugular catheter tip is in the superior vena cava. Port-A-Cath tip is in the superior cava. Nasogastric tube tip and side port are below the diaphragm. No pneumothorax. There is airspace opacity inferior to the right hilum in the right lower lung zone, stable from earlier in the day. There is underlying interstitial edema. Heart size and pulmonary vascularity are within normal limits. No adenopathy.  IMPRESSION: Tube and catheter positions as described without pneumothorax. Stable right lower lobe infiltrate. Concern for aspiration. Underlying interstitial edema. Differential considerations must include noncardiogenic edema, atypical infectious pneumonia, or possibly allergic type reaction. Cardiac silhouette within normal limits.   Electronically Signed   By: Lowella Grip III M.D.   On: 09/03/2014 10:58   Dg Chest Port 1 View  09/03/2014   CLINICAL DATA:  Tachypnea  EXAM: PORTABLE CHEST - 1 VIEW  COMPARISON:  09/02/2014  FINDINGS: Power injectable right Port-A-Cath tip:  Lower SVC.  The patient is rotated to the left on today's radiograph, reducing diagnostic sensitivity and specificity. New bilateral abnormal interstitial accentuation noted with airway thickening and right infrahilar airspace opacity. Compensating for the leftward rotation, heart size is within normal limits. No pleural effusion observed.  IMPRESSION: 1. New right infrahilar airspace opacity, possibly pneumonia or aspiration pneumonitis. 2. Bilateral new interstitial accentuation, potentially from noncardiogenic edema or atypical pneumonia.   Electronically Signed   By: Van Clines M.D.   On: 09/03/2014 07:03   Dg Chest Port 1 View  09/02/2014    CLINICAL DATA:  Altered mental Joann, diarrhea and hematemesis for 2 days. History of liver cancer.  EXAM: PORTABLE CHEST - 1 VIEW  COMPARISON:  06/07/2014  FINDINGS: The power port is stable. The cardiac silhouette, mediastinal and hilar contours are normal. The lungs are clear. No pleural effusions or pneumothorax. Surgical changes noted in the right upper abdomen.  IMPRESSION: No acute cardiopulmonary findings.   Electronically Signed   By: Marijo Sanes M.D.   On: 09/02/2014 14:49   [place] Image Guided  Drainage By Percutaneous Catheter  09/09/2014   CLINICAL DATA:  59 year old female with a history of Castaneda, Joann Castaneda.  She presents on this hospital admission with evidence of abscess in the right liver lobe.  She also has ascitic fluid surrounding the liver, and she has been referred for abscess drainage and diagnostic paracentesis.  EXAM: ULTRASOUND GUIDED DRAINAGE OF RIGHT LIVER LOBE ABSCESS  ULTRASOUND-GUIDED PARACENTESIS FOR DIAGNOSTIC PURPOSE  MEDICATIONS: 1.0 mg IV Versed; 50 mcg IV Fentanyl  Total Moderate Sedation Time: 37  PROCEDURE: The procedure, risks, benefits, and alternatives were explained to the patient. Questions regarding the procedure were encouraged and answered. The patient understands and consents to the procedure.  Ultrasound survey of the right upper quadrant was performed with images stored and sent to PACs.  The right upper quad was prepped with Betadine in a sterile fashion, and a sterile drape was applied covering the operative field. A sterile gown and sterile gloves were used for the procedure. Local anesthesia was provided with 1% Lidocaine.  Once the patient is prepped and draped in the usual sterile fashion, the skin and subcutaneous tissues were generously infiltrated with 1% lidocaine for local anesthesia. A small stab incision was made with 11 blade scalpel, and then trocar technique was used to advance a 10 [place] drain in an  intercostal location into gas and fluid collection at the dome of the liver.  The drain was advanced into this collection and the pigtail catheter was formed. Combination of gas and brown bilious fluid were aspirated. Catheter was fixed to the skin with a StatLock.  A second collection at the inferior aspect the right liver lobe was targeted. The skin and subcutaneous tissues were generously infiltrated with 1% lidocaine, and a 10 French drain was advanced into this collection with trocar technique. Pigtail catheter was formed and serosanguineous fluid was aspirated from this collection. This drain was anchored to the skin with a StatLock.  Finally, the ascitic fluid in the right upper quadrant was then addressed by advancing yueh catheter under ultrasound guidance after anesthesia of the skin and subcutaneous tissues with 1% lidocaine.  A total of 430 cc of ascitic fluid was aspirated.  The superior drain was attached to gravity drainage, draining the dome of the liver abscess, and the inferior drain was attached to gravity drainage, draining a right liver lobe lesion.  Patient tolerated the procedure well and remained hemodynamically stable throughout.  No complications were encountered and no significant blood loss was encountered.  COMPLICATIONS: None.  FINDINGS: Ultrasound survey demonstrates gas and fluid collection at the liver dome. In addition there is a smaller collection more inferiorly along the inferior right liver lobe.  Ascitic fluid of the right upper quadrant was also identified by ultrasound survey.  Final images demonstrate drain within the dome of the abscess attached to gravity drain, which is the superiorly placed drain in the right upper quadrant.  The inferiorly placed drain is within the more inferior right liver lobe collection.  IMPRESSION: Joann post ultrasound-guided drainage of 2 right-sided gas and fluid collections. Sample sent to the lab from each collection.  Joann post  ultrasound-guided diagnostic paracentesis.  Signed,  Dulcy Fanny. Earleen Newport, DO  [facility]  [facility]  PLAN: The drain that is cranially placed/superior drains the larger gas and fluid collection of the liver dome. This was labeled drain 1.  The drain that is caudally placed/inferior drains the smaller fluid collection at the right  liver. This was labeled drain 2.   Electronically Signed   By: Corrie Mckusick D.O.   On: 09/09/2014 08:40   [place] Image Guided Drainage By Percutaneous Catheter  09/09/2014   CLINICAL DATA:  59 year old female with a history of Castaneda, Joann Castaneda.  She presents on this hospital admission with evidence of abscess in the right liver lobe.  She also has ascitic fluid surrounding the liver, and she has been referred for abscess drainage and diagnostic paracentesis.  EXAM: ULTRASOUND GUIDED DRAINAGE OF RIGHT LIVER LOBE ABSCESS  ULTRASOUND-GUIDED PARACENTESIS FOR DIAGNOSTIC PURPOSE  MEDICATIONS: 1.0 mg IV Versed; 50 mcg IV Fentanyl  Total Moderate Sedation Time: 37  PROCEDURE: The procedure, risks, benefits, and alternatives were explained to the patient. Questions regarding the procedure were encouraged and answered. The patient understands and consents to the procedure.  Ultrasound survey of the right upper quadrant was performed with images stored and sent to PACs.  The right upper quad was prepped with Betadine in a sterile fashion, and a sterile drape was applied covering the operative field. A sterile gown and sterile gloves were used for the procedure. Local anesthesia was provided with 1% Lidocaine.  Once the patient is prepped and draped in the usual sterile fashion, the skin and subcutaneous tissues were generously infiltrated with 1% lidocaine for local anesthesia. A small stab incision was made with 11 blade scalpel, and then trocar technique was used to advance a 10 [place] drain in an intercostal  location into gas and fluid collection at the dome of the liver.  The drain was advanced into this collection and the pigtail catheter was formed. Combination of gas and brown bilious fluid were aspirated. Catheter was fixed to the skin with a StatLock.  A second collection at the inferior aspect the right liver lobe was targeted. The skin and subcutaneous tissues were generously infiltrated with 1% lidocaine, and a 10 French drain was advanced into this collection with trocar technique. Pigtail catheter was formed and serosanguineous fluid was aspirated from this collection. This drain was anchored to the skin with a StatLock.  Finally, the ascitic fluid in the right upper quadrant was then addressed by advancing yueh catheter under ultrasound guidance after anesthesia of the skin and subcutaneous tissues with 1% lidocaine.  A total of 430 cc of ascitic fluid was aspirated.  The superior drain was attached to gravity drainage, draining the dome of the liver abscess, and the inferior drain was attached to gravity drainage, draining a right liver lobe lesion.  Patient tolerated the procedure well and remained hemodynamically stable throughout.  No complications were encountered and no significant blood loss was encountered.  COMPLICATIONS: None.  FINDINGS: Ultrasound survey demonstrates gas and fluid collection at the liver dome. In addition there is a smaller collection more inferiorly along the inferior right liver lobe.  Ascitic fluid of the right upper quadrant was also identified by ultrasound survey.  Final images demonstrate drain within the dome of the abscess attached to gravity drain, which is the superiorly placed drain in the right upper quadrant.  The inferiorly placed drain is within the more inferior right liver lobe collection.  IMPRESSION: Joann post ultrasound-guided drainage of 2 right-sided gas and fluid collections. Sample sent to the lab from each collection.  Joann post ultrasound-guided  diagnostic paracentesis.  Signed,  Dulcy Fanny. Earleen Newport DO  [facility]  [facility]  PLAN: The drain that is cranially placed/superior drains the larger gas  and fluid collection of the liver dome. This was labeled drain 1.  The drain that is caudally placed/inferior drains the smaller fluid collection at the right liver. This was labeled drain 2.   Electronically Signed   By: Corrie Mckusick D.O.   On: 09/09/2014 08:40   US Abdomen Limited Ruq  09/02/2014   CLINICAL DATA:  59 year old female with history of liver cancer post liver Castaneda. Prior cholecystectomy. Nausea and vomiting with abdominal pain for 3 days. Subsequent encounter.  EXAM: US ABDOMEN LIMITED - RIGHT UPPER QUADRANT  COMPARISON:  02/07/2014 ultrasound.  FINDINGS: Gallbladder:  Post cholecystectomy.  Common bile duct:  Diameter: 2.3 mm.  Pneumobilia.  Liver:  Heterogeneous appearance of the liver with complex 2.3 x 1.9 x 1.8 cm cystic lesion within the right lobe of the liver. Etiology indeterminate. Question abscess or mass or focal bile collection. Pneumobilia.  Small amount of ascites.  IMPRESSION: Post partial Castaneda of the liver. Complex cystic lesion within the residual liver measures up to 2.3 cm. Question abscess versus mass or possibly focal bile collection.  Pneumobilia.  Small amount of ascites.   Electronically Signed   By: Genia Del M.D.   On: 09/02/2014 17:01    Microbiology: Recent Results (from the past 240 hour(s))  Culture, blood (routine x 2)     Joann: None   Collection Time: 09/02/14  1:51 PM  Result Value Ref Range Joann   Specimen Description BLOOD LEFT HAND  Final   Special Requests BOTTLES DRAWN AEROBIC AND ANAEROBIC 2ML  Final   Culture  Setup Time   Final    GRAM NEGATIVE RODS IN BOTH AEROBIC AND ANAEROBIC BOTTLES CRITICAL RESULT CALLED TO, READ BACK BY AND VERIFIED WITH: A [facility] 09/03/14 AT 0358 RHOLMES CONFIRMED BY M CAMPBELL    Culture   Final     KLEBSIELLA SPECIES SUSCEPTIBILITIES PERFORMED ON PREVIOUS CULTURE WITHIN THE LAST 5 DAYS. Performed at [facility]    Report Joann 09/05/2014 FINAL  Final  Culture, blood (routine x 2)     Joann: None   Collection Time: 09/02/14  2:10 PM  Result Value Ref Range Joann   Specimen Description BLOOD RIGHT ANTECUBITAL  Final   Special Requests BOTTLES DRAWN AEROBIC AND ANAEROBIC 5CC  Final   Culture  Setup Time   Final    GRAM NEGATIVE RODS IN BOTH AEROBIC AND ANAEROBIC BOTTLES CRITICAL RESULT CALLED TO, READ BACK BY AND VERIFIED WITH: A [facility] 09/03/14 AT [place] CONFIRMED BY M CAMPBELL    Culture   Final    KLEBSIELLA SPECIES Performed at [facility]    Report Joann 09/05/2014 FINAL  Final   Organism ID, Bacteria KLEBSIELLA SPECIES  Final      Susceptibility   Klebsiella species - MIC*    AMPICILLIN 16 RESISTANT Resistant     CEFAZOLIN <=4 SENSITIVE Sensitive     CEFEPIME <=1 SENSITIVE Sensitive     CEFTAZIDIME <=1 SENSITIVE Sensitive     CEFTRIAXONE <=1 SENSITIVE Sensitive     CIPROFLOXACIN >=4 RESISTANT Resistant     GENTAMICIN <=1 SENSITIVE Sensitive     IMIPENEM <=0.25 SENSITIVE Sensitive     TRIMETH/SULFA <=20 SENSITIVE Sensitive     AMPICILLIN/SULBACTAM 4 SENSITIVE Sensitive     PIP/TAZO 8 SENSITIVE Sensitive     * KLEBSIELLA SPECIES  Urine culture     Joann: None   Collection Time: 09/02/14  2:23 PM  Result Value Ref Range Joann   Specimen Description  URINE, CATHETERIZED  Final   Special Requests NONE  Final   Culture   Final    NO GROWTH 1 DAY Performed at [facility]    Report Joann 09/03/2014 FINAL  Final  MRSA PCR Screening     Joann: None   Collection Time: 09/02/14  5:01 PM  Result Value Ref Range Joann   MRSA by PCR NEGATIVE NEGATIVE Final    Comment:        The GeneXpert MRSA Assay (FDA approved for NASAL specimens only), is one component of a comprehensive MRSA colonization surveillance program. It is  not intended to diagnose MRSA infection nor to guide or monitor treatment for MRSA infections.   Culture, respiratory (NON-Expectorated)     Joann: None   Collection Time: 09/04/14  8:31 AM  Result Value Ref Range Joann   Specimen Description TRACHEAL ASPIRATE  Final   Special Requests Normal  Final   Gram Stain   Final    FEW WBC PRESENT,BOTH PMN AND MONONUCLEAR RARE SQUAMOUS EPITHELIAL CELLS PRESENT RARE YEAST Performed at [facility]    Culture   Final    MODERATE YEAST CONSISTENT WITH CANDIDA SPECIES Performed at [facility]    Report Joann 09/07/2014 FINAL  Final  Culture, routine-abscess     Joann: None   Collection Time: 09/06/14 12:22 PM  Result Value Ref Range Joann   Specimen Description LIVER  Final   Special Requests NONE  Final   Gram Stain   Final    MODERATE WBC PRESENT,BOTH PMN AND MONONUCLEAR NO SQUAMOUS EPITHELIAL CELLS SEEN FEW GRAM POSITIVE COCCI IN CLUSTERS IN CHAINS Performed at [facility]    Culture   Final    MODERATE KLEBSIELLA PNEUMONIAE ABUNDANT ENTEROCOCCUS SPECIES Performed at [facility]    Report Joann 09/10/2014 FINAL  Final   Organism ID, Bacteria KLEBSIELLA PNEUMONIAE  Final   Organism ID, Bacteria ENTEROCOCCUS SPECIES  Final      Susceptibility   Klebsiella pneumoniae - MIC*    AMPICILLIN RESISTANT      AMPICILLIN/SULBACTAM 8 SENSITIVE Sensitive     CEFAZOLIN <=4 SENSITIVE Sensitive     CEFEPIME <=1 SENSITIVE Sensitive     CEFTAZIDIME <=1 SENSITIVE Sensitive     CEFTRIAXONE <=1 SENSITIVE Sensitive     CIPROFLOXACIN >=4 RESISTANT Resistant     GENTAMICIN <=1 SENSITIVE Sensitive     IMIPENEM <=0.25 SENSITIVE Sensitive     PIP/TAZO 8 SENSITIVE Sensitive     TOBRAMYCIN <=1 SENSITIVE Sensitive     TRIMETH/SULFA <=20 SENSITIVE Sensitive     * MODERATE KLEBSIELLA PNEUMONIAE   Enterococcus species - MIC*    VANCOMYCIN <=0.5 SENSITIVE Sensitive     AMPICILLIN >=Joann RESISTANT Resistant     *  ABUNDANT ENTEROCOCCUS SPECIES  Culture, routine-abscess     Joann: None   Collection Time: 09/06/14 12:22 PM  Result Value Ref Range Joann   Specimen Description LIVER RIGHT LIVER LOBE ABSCESS  Final   Special Requests NONE  Final   Gram Stain   Final    NO WBC SEEN NO SQUAMOUS EPITHELIAL CELLS SEEN NO ORGANISMS SEEN Performed at [facility]    Culture   Final    NO GROWTH 3 DAYS Performed at [facility]    Report Joann 09/09/2014 FINAL  Final  AFB culture with smear     Joann: None (Preliminary result)   Collection Time: 09/06/14 12:26 PM  Result Value Ref Range Joann  Specimen Description PERITONEAL CAVITY  Final   Special Requests NONE  Final   Acid Fast Smear   Final    NO ACID FAST BACILLI SEEN Performed at [facility]    Culture   Final    CULTURE WILL BE EXAMINED FOR 6 WEEKS BEFORE ISSUING A FINAL REPORT Performed at [facility]    Report Joann PENDING  Incomplete  Culture, body fluid-bottle     Joann: None   Collection Time: 09/06/14 12:26 PM  Result Value Ref Range Joann   Specimen Description PERITONEAL FLUID  Final   Special Requests NONE  Final   Gram Stain   Final    WBC PRESENT, PREDOMINANTLY PMN NO ORGANISMS SEEN CONFIRMED BY K. WOOTEN    Culture   Final    NO GROWTH 5 DAYS Performed at [facility]    Report Joann 09/11/2014 FINAL  Final  Gram stain     Joann: None   Collection Time: 09/06/14 12:26 PM  Result Value Ref Range Joann   Specimen Description PERITONEAL  Final   Special Requests NONE  Final   Gram Stain   Final    WBC PRESENT, PREDOMINANTLY PMN NO ORGANISMS SEEN CONFIRMED BY Louie Casa Performed at [facility]    Report Joann 09/07/2014 FINAL  Final  Anaerobic culture     Joann: None   Collection Time: 09/06/14 12:36 PM  Result Value Ref Range Joann   Specimen Description PERITONEAL CAVITY  Final   Special Requests NONE  Final   Gram Stain   Final    NO WBC  SEEN NO SQUAMOUS EPITHELIAL CELLS SEEN NO ORGANISMS SEEN Performed at [facility]    Culture   Final    NO ANAEROBES ISOLATED Performed at [facility]    Report Joann 09/11/2014 FINAL  Final  Culture, blood (routine x 2)     Joann: None (Preliminary result)   Collection Time: 09/09/14 10:05 AM  Result Value Ref Range Joann   Specimen Description BLOOD LEFT ARM  Final   Special Requests BOTTLES DRAWN AEROBIC ONLY 2CC  Final   Culture   Final    NO GROWTH 2 DAYS Performed at [facility]    Report Joann PENDING  Incomplete  Culture, blood (routine x 2)     Joann: None (Preliminary result)   Collection Time: 09/09/14 10:10 AM  Result Value Ref Range Joann   Specimen Description BLOOD RIGHT HAND  Final   Special Requests BOTTLES DRAWN AEROBIC ONLY 2CC  Final   Culture   Final    NO GROWTH 2 DAYS Performed at [facility]    Report Joann PENDING  Incomplete     Labs: Basic Metabolic Panel:  Recent Labs Lab 09/06/14 0427 09/07/14 0600 09/08/14 0420 09/09/14 0545 09/10/14 0312  NA 136 137 140 136 135  K 3.0* 3.7 3.5 3.0* 3.8  CL 111 113* 116* 111 108  CO2 21* 19* 20* 20* 23  GLUCOSE 112* 105* 107* 90 100*  BUN 65* 56* 41* 29* 26*  CREATININE 1.Joann* 1.03* 0.96 0.95 0.99  CALCIUM 7.4* 7.4* 7.4* 7.4* 7.4*  MG  --   --  2.4  --   --   PHOS  --   --  2.7  --   --    Liver Function Tests:  Recent Labs Lab 09/06/14 0427 09/08/14 0420 09/09/14 0545  AST 189* 123* 102*  ALT 97* 72* 60*  ALKPHOS 778*  663* 675*  BILITOT 2.6* 2.6* 3.0*  PROT 5.0* 4.8* 5.0*  ALBUMIN 1.6* 1.5*  1.5* 1.6*   No results for input(s): LIPASE, AMYLASE in the last 168 hours. No results for input(s): AMMONIA in the last 168 hours. CBC:  Recent Labs Lab 09/07/14 0600 09/08/14 0420 09/09/14 0545 09/10/14 0312 09/10/14 0805 09/11/14 0133  WBC 9.3 9.3 11.2* 10.5  --  10.8*  NEUTROABS  --  7.0  --   --   --   --   HGB 7.9* 8.7* 7.4* 6.9* 6.8* 8.2*   HCT 23.8* 27.4* 22.1* 20.8* 20.8* 24.6*  MCV 84.1 84.8 85.3 85.2  --  86.9  PLT 94* 71* 100* 109*  --  111*   Cardiac Enzymes: No results for input(s): CKTOTAL, CKMB, CKMBINDEX, TROPONINI in the last 168 hours. BNP: BNP (last 3 results) No results for input(s): BNP in the last 8760 hours.  ProBNP (last 3 results) No results for input(s): PROBNP in the last 8760 hours.  CBG:  Recent Labs Lab 09/08/14 1244 09/08/14 1749 09/08/14 2354 09/09/14 0701 09/09/14 1201  GLUCAP 141* 128* 97 79 124*    Signed:  Lewayne Pauley  [facility] 09/12/2014, 9:12 AM  Addendum: Blood culture growing Klebsiella which is sensitive to imipenems. Will d/c linezolid

## 2014-09-14 LAB — CULTURE, BLOOD (ROUTINE X 2)
CULTURE: NO GROWTH
CULTURE: NO GROWTH

## 2014-09-14 LAB — TYPE AND SCREEN
ABO/RH(D): A POS
Antibody Screen: NEGATIVE
DONOR AG TYPE: NEGATIVE
DONOR AG TYPE: NEGATIVE
Unit division: 0
Unit division: 0

## 2014-09-15 ENCOUNTER — Other Ambulatory Visit (HOSPITAL_COMMUNITY): Payer: Self-pay | Admitting: Interventional Radiology

## 2014-09-15 DIAGNOSIS — C221 Intrahepatic bile duct carcinoma: Secondary | ICD-10-CM

## 2014-09-16 ENCOUNTER — Ambulatory Visit (HOSPITAL_COMMUNITY): Admission: RE | Admit: 2014-09-16 | Payer: BLUE CROSS/BLUE SHIELD | Source: Ambulatory Visit

## 2014-09-16 ENCOUNTER — Ambulatory Visit (HOSPITAL_COMMUNITY)
Admission: RE | Admit: 2014-09-16 | Discharge: 2014-09-16 | Disposition: A | Discharge status: Home / Self Care | Payer: BLUE CROSS/BLUE SHIELD | Source: Ambulatory Visit | Attending: Interventional Radiology | Admitting: Interventional Radiology

## 2014-09-16 ENCOUNTER — Other Ambulatory Visit (HOSPITAL_COMMUNITY): Payer: Self-pay | Admitting: Interventional Radiology

## 2014-09-16 DIAGNOSIS — K750 Abscess of liver: Secondary | ICD-10-CM

## 2014-09-16 DIAGNOSIS — J90 Pleural effusion, not elsewhere classified: Secondary | ICD-10-CM | POA: Diagnosis not present

## 2014-09-16 DIAGNOSIS — Z9889 Other specified postprocedural states: Secondary | ICD-10-CM | POA: Insufficient documentation

## 2014-09-16 DIAGNOSIS — R188 Other ascites: Secondary | ICD-10-CM | POA: Insufficient documentation

## 2014-09-16 DIAGNOSIS — R601 Generalized edema: Secondary | ICD-10-CM | POA: Diagnosis not present

## 2014-09-16 DIAGNOSIS — K769 Liver disease, unspecified: Secondary | ICD-10-CM | POA: Diagnosis not present

## 2014-09-16 DIAGNOSIS — C221 Intrahepatic bile duct carcinoma: Secondary | ICD-10-CM | POA: Insufficient documentation

## 2014-09-30 ENCOUNTER — Ambulatory Visit: Payer: BLUE CROSS/BLUE SHIELD | Admitting: Internal Medicine

## 2014-10-19 LAB — AFB CULTURE WITH SMEAR (NOT AT ARMC): Acid Fast Smear: NONE SEEN

## 2014-10-23 DEATH — deceased

## 2016-09-14 IMAGING — DX DG ABDOMEN 1V
1 series · 1 of 1 positions shown · non-contrast
Comparison: None.

CLINICAL DATA: NG tube placement.  Hematemesis tonight.

EXAM:
ABDOMEN - 1 VIEW

[abdomen kub]
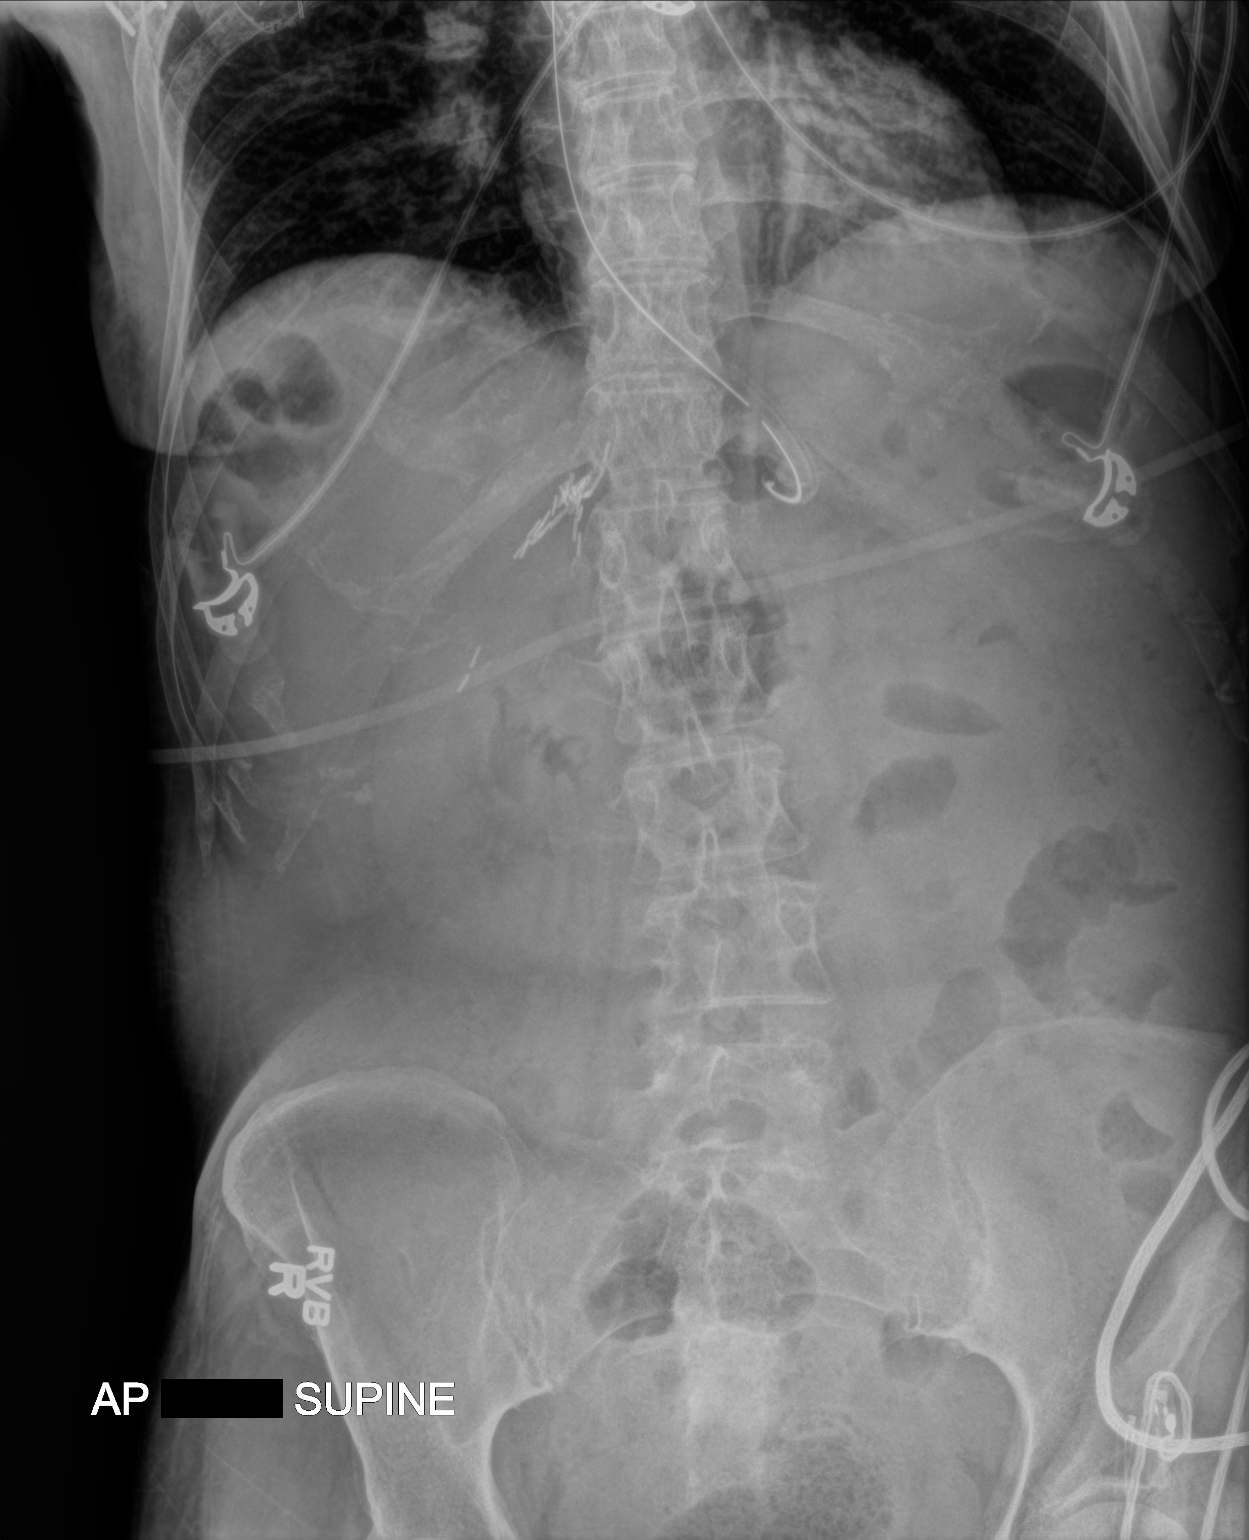

[1 of 1 positions shown; findings below may reference images not displayed]

FINDINGS: Enteric tube tip is in the left upper quadrant consistent with
location in the upper stomach. Surgical clips in the right upper
quadrant. No small or large bowel dilatation.
IMPRESSION: Enteric tube tip is in the left upper quadrant consistent with
location in the upper stomach.

## 2016-09-15 IMAGING — DX DG CHEST 1V PORT
1 series · 1 of 1 positions shown · non-contrast
Comparison: 09/02/2014

CLINICAL DATA: Tachypnea

EXAM:
PORTABLE CHEST - 1 VIEW

[chest ap]
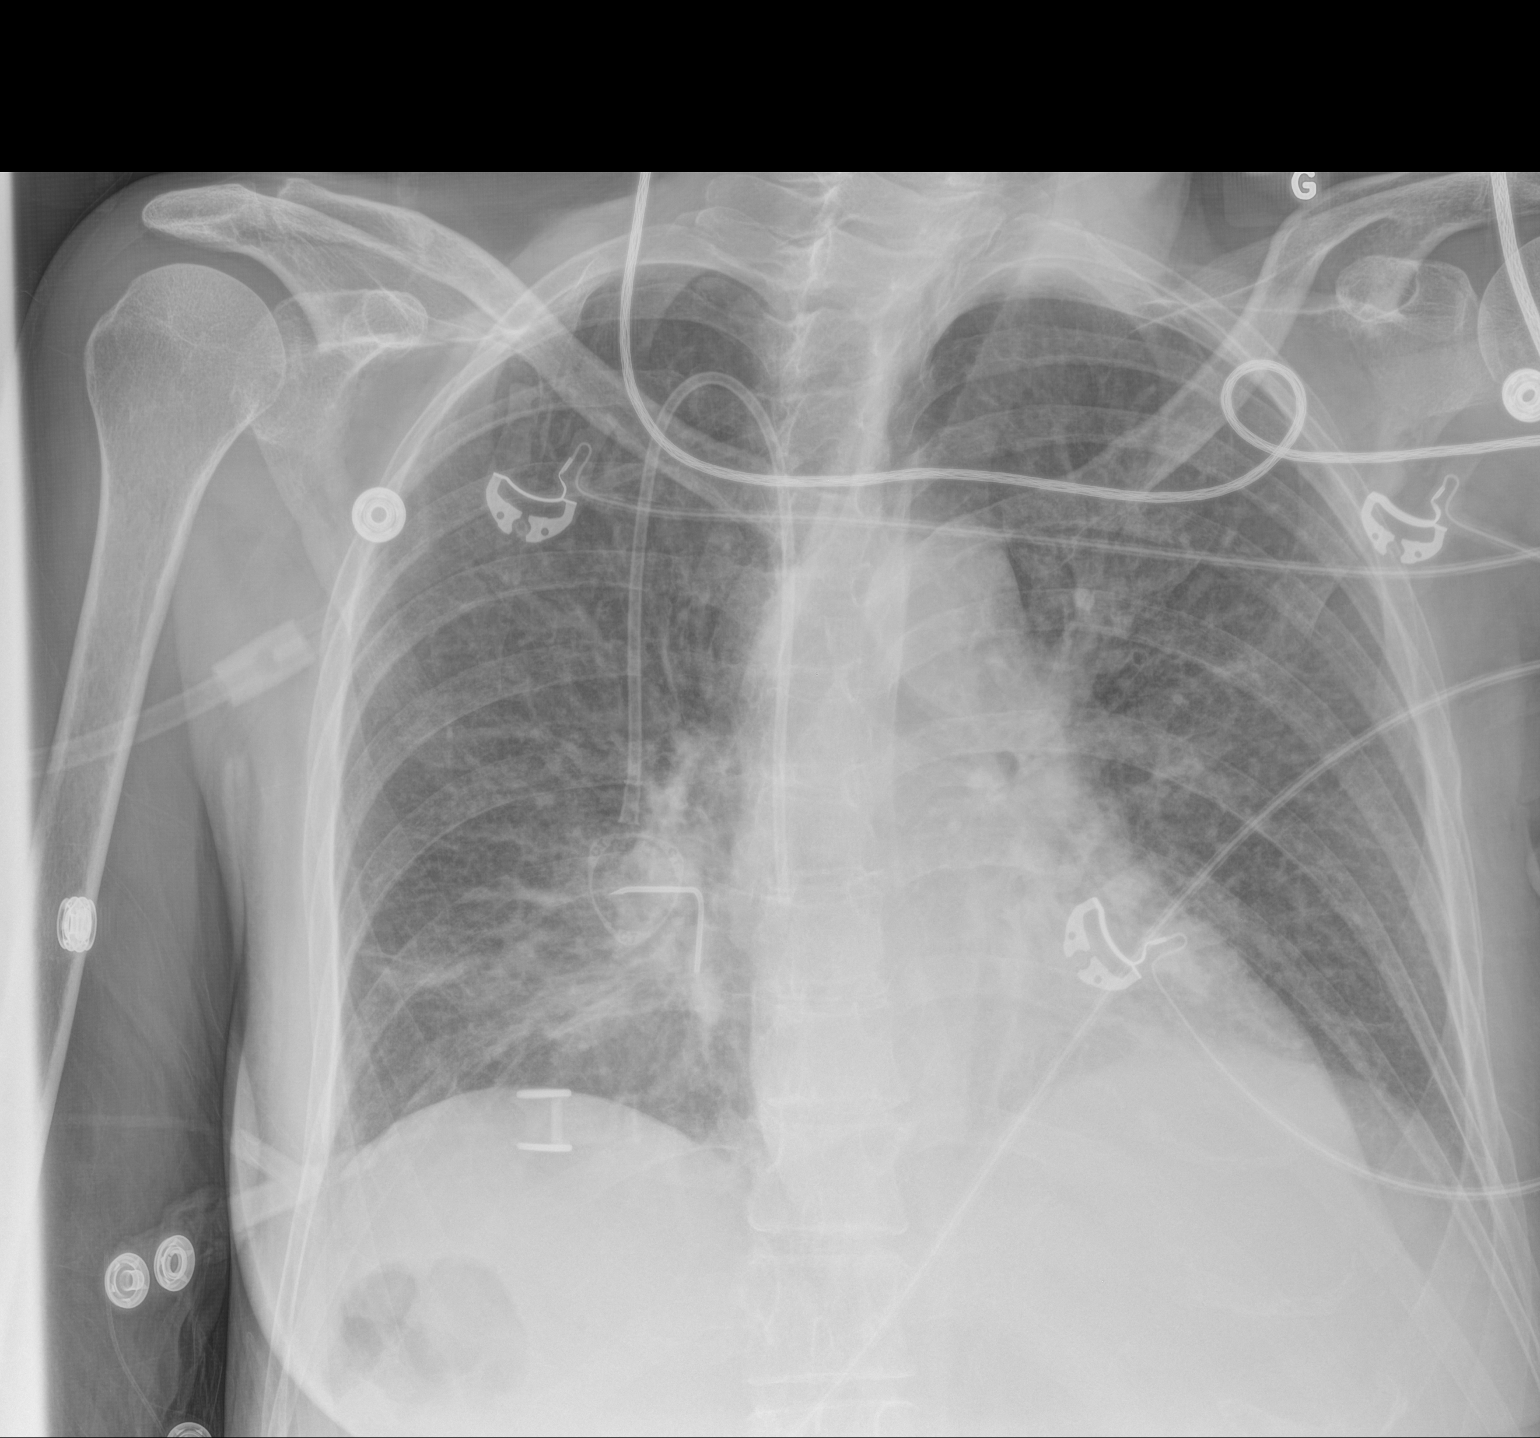

[1 of 1 positions shown; findings below may reference images not displayed]

FINDINGS: Power injectable right Port-A-Cath tip:  Lower SVC.

The patient is rotated to the left on today's radiograph, reducing
diagnostic sensitivity and specificity. New bilateral abnormal
interstitial accentuation noted with airway thickening and right
infrahilar airspace opacity. Compensating for the leftward rotation,
heart size is within normal limits. No pleural effusion observed.
IMPRESSION: 1. New right infrahilar airspace opacity, possibly pneumonia or
aspiration pneumonitis.
2. Bilateral new interstitial accentuation, potentially from
noncardiogenic edema or atypical pneumonia.

## 2016-09-15 IMAGING — DX DG CHEST 1V PORT
1 series · 1 of 1 positions shown · non-contrast
Comparison: Study obtained earlier in the day

CLINICAL DATA: Hypoxia.  History of cholangiocarcinoma

EXAM:
PORTABLE CHEST - 1 VIEW

[chest ap]
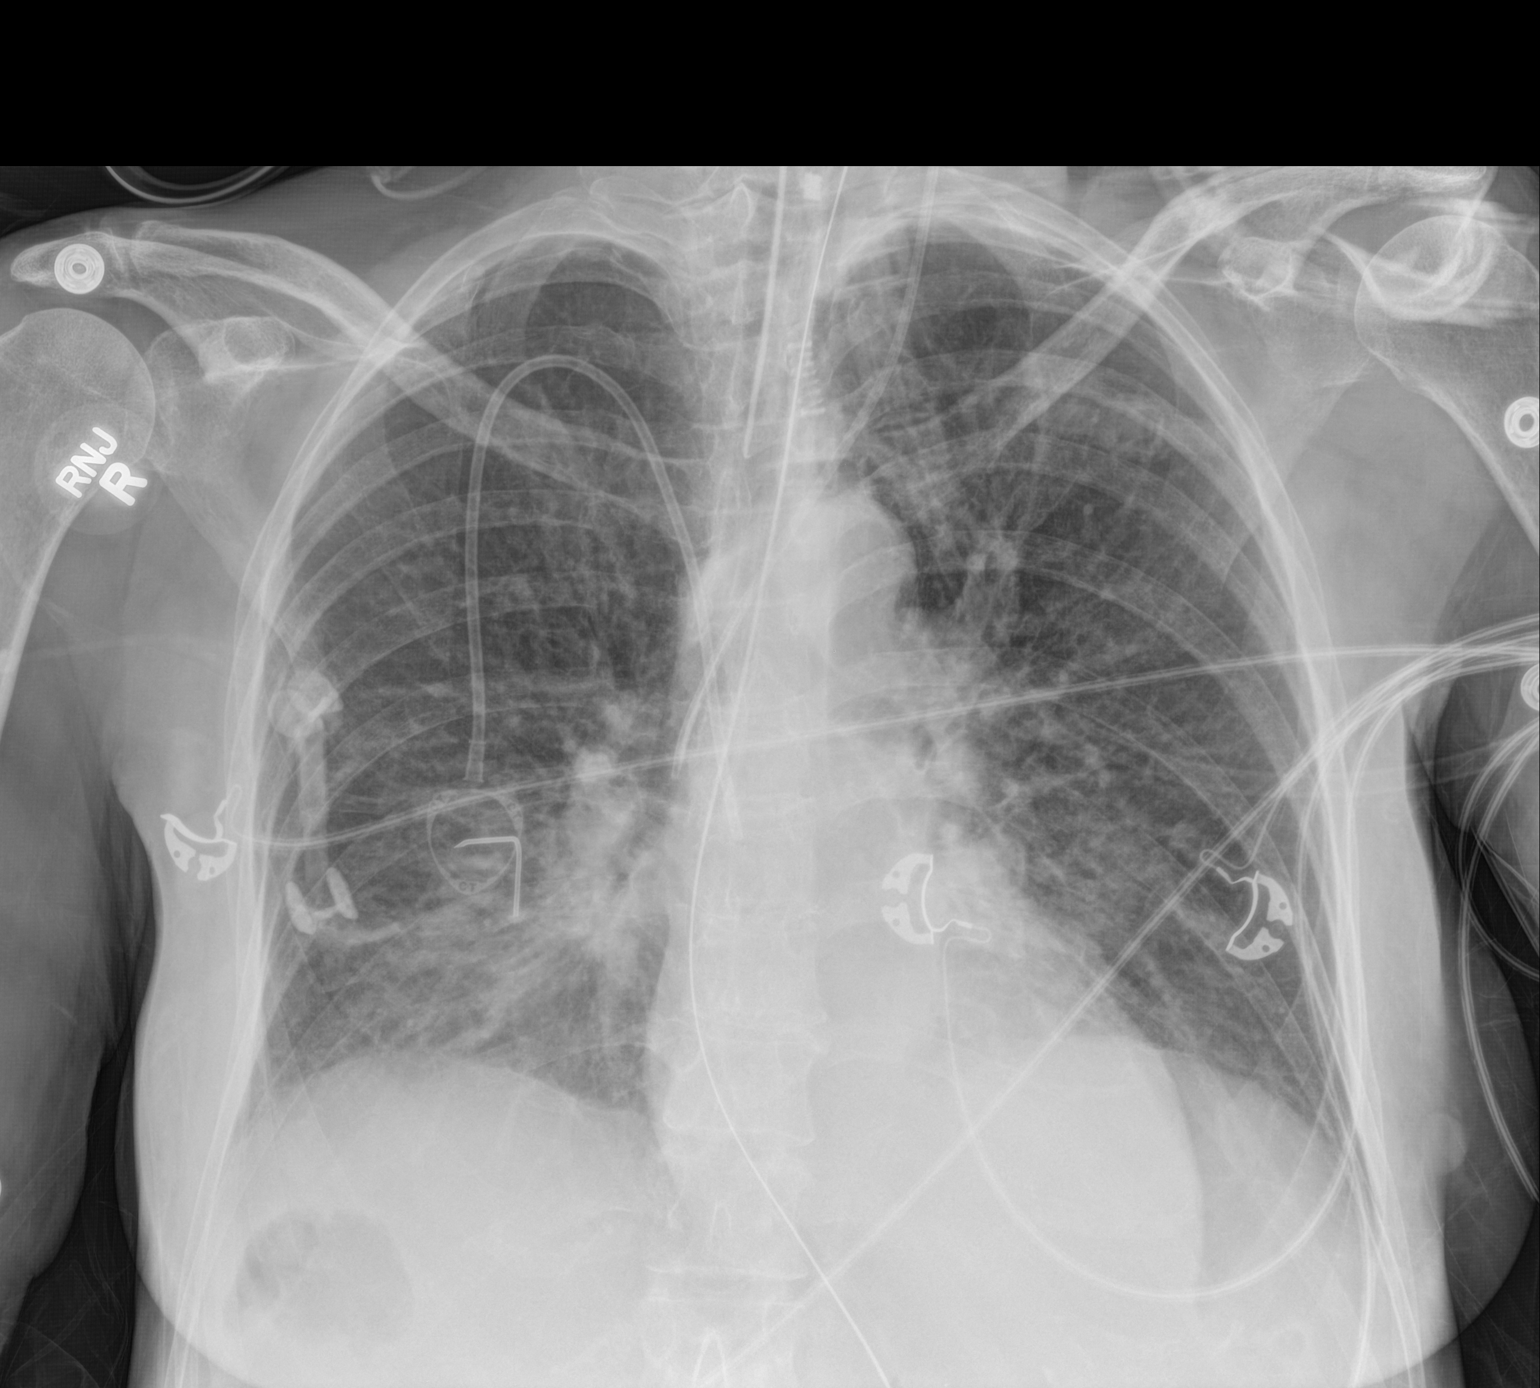

[1 of 1 positions shown; findings below may reference images not displayed]

FINDINGS: Endotracheal tube tip is 4.4 cm above the carina. Left jugular
catheter tip is in the superior vena cava. Port-A-Cath tip is in the
superior cava. Nasogastric tube tip and side port are below the
diaphragm. No pneumothorax. There is airspace opacity inferior to
the right hilum in the right lower lung zone, stable from earlier in
the day. There is underlying interstitial edema. Heart size and
pulmonary vascularity are within normal limits. No adenopathy.
IMPRESSION: Tube and catheter positions as described without pneumothorax.
Stable right lower lobe infiltrate. Concern for aspiration.
Underlying interstitial edema. Differential considerations must
include noncardiogenic edema, atypical infectious pneumonia, or
possibly allergic type reaction. Cardiac silhouette within normal
limits.

## 2016-09-17 IMAGING — DX DG CHEST 1V PORT
1 series · 1 of 1 positions shown · non-contrast
Comparison: September 04, 2014

CLINICAL DATA: Respiratory failure.  Endotracheal tube removed

EXAM:
PORTABLE CHEST - 1 VIEW

[chest ap]
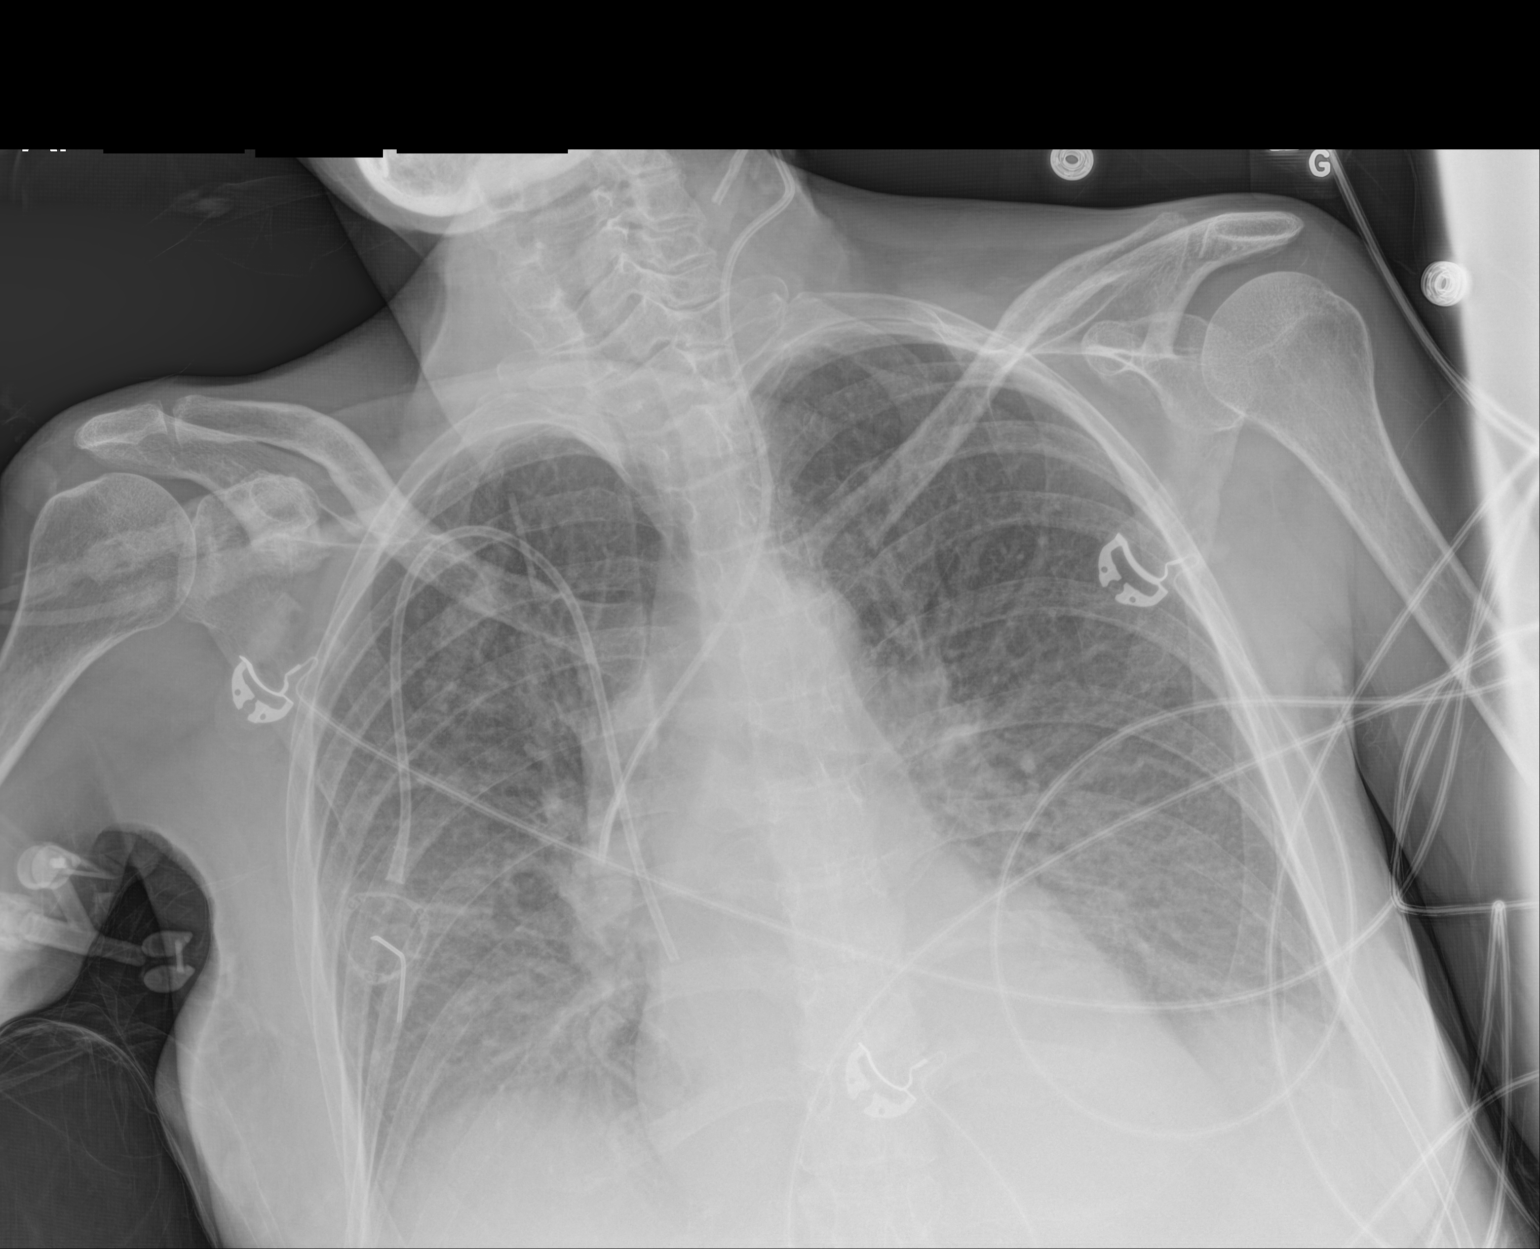

[1 of 1 positions shown; findings below may reference images not displayed]

FINDINGS: Endotracheal tube and nasogastric tube have been removed. Left
jugular catheter tip is in the superior vena cava. Port-A-Cath tip
is in the superior cava near the cavoatrial junction. No
pneumothorax. There is stable underlying interstitial edema. The
previously and right lower lobe consolidation has essentially
completely resolved. No new opacity. Heart size and pulmonary
vascularity are normal. No adenopathy.
IMPRESSION: Stable underlying interstitial edema. No airspace consolidation.
Catheter positions as described without pneumothorax.

## 2016-09-18 IMAGING — US US IMAGE GUIDED DRAINAGE BY PERCUTANEOUS CATHETER
1 series · 12 of 12 positions shown · non-contrast
Comparison: none

CLINICAL DATA: 59-year-old female with a history of
cholangiocarcinoma, status post left liver lobe resection.

[Series 1: us image guided drainage by percutaneous catheter · 0.13mm/px · 12 of 12 slices shown]
[im 1/12]
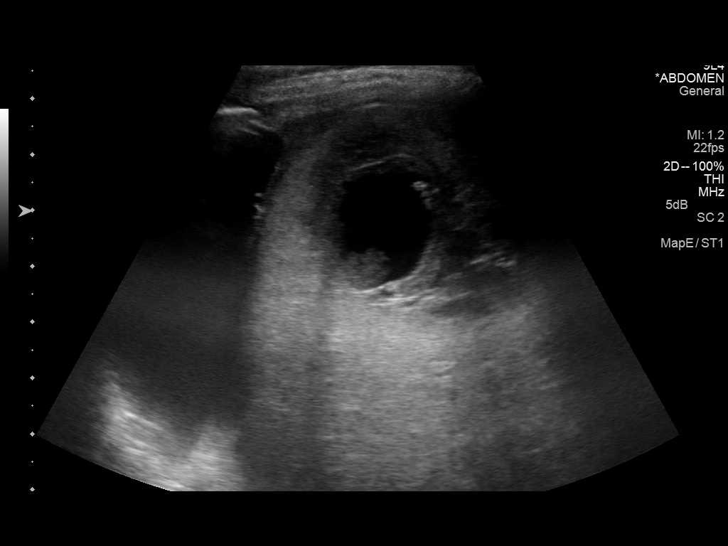
[im 2/12]
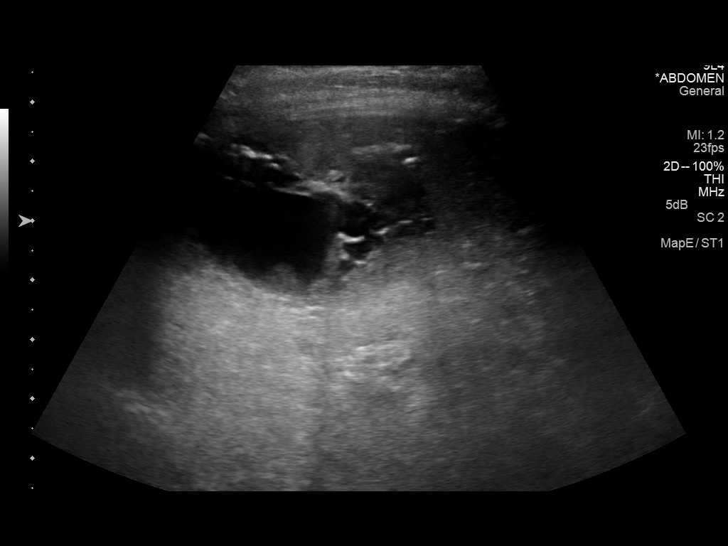
[im 3/12]
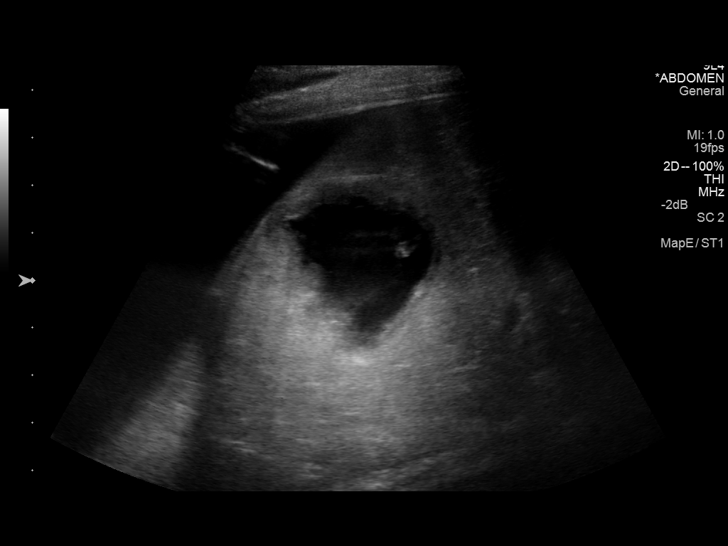
[im 4/12]
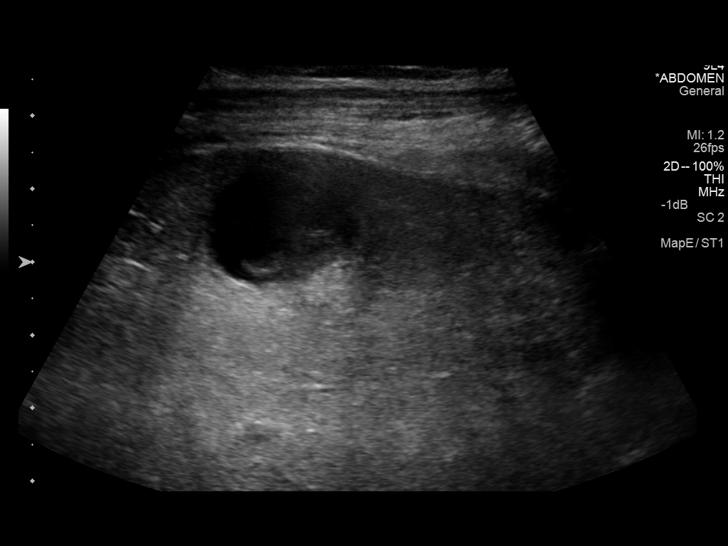
[im 5/12]
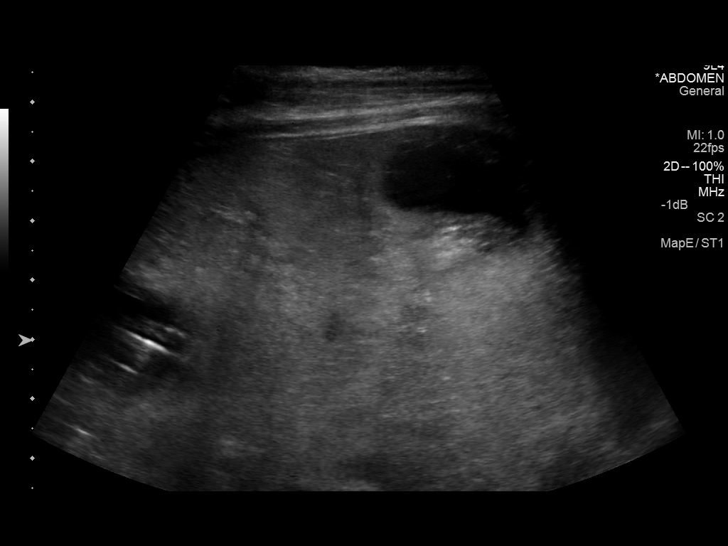
[im 6/12]
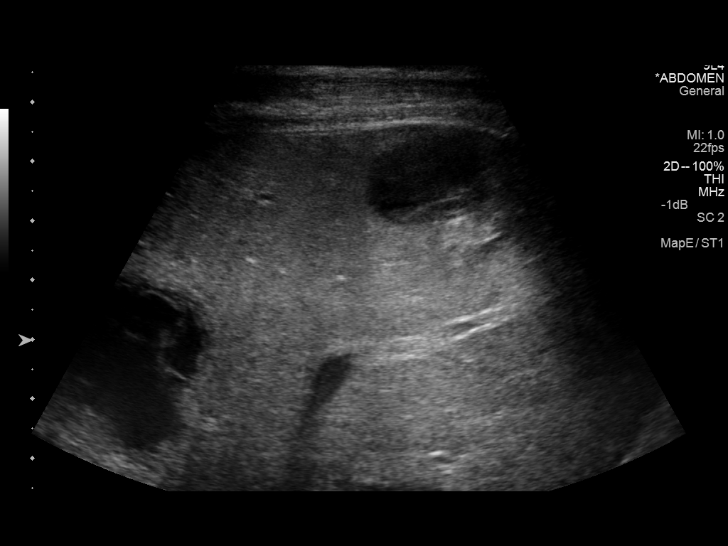
[im 7/12]
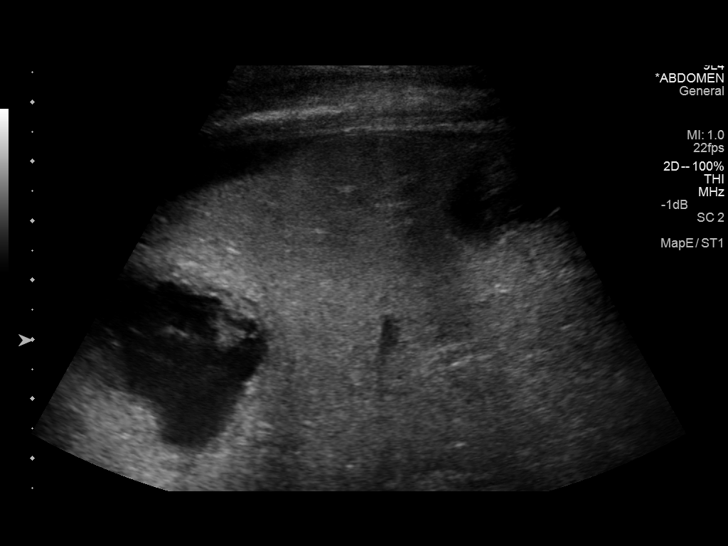
[im 8/12]
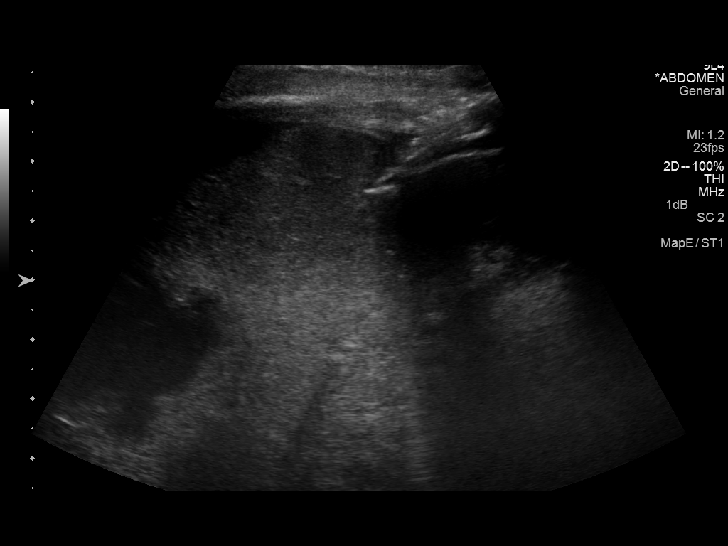
[im 9/12]
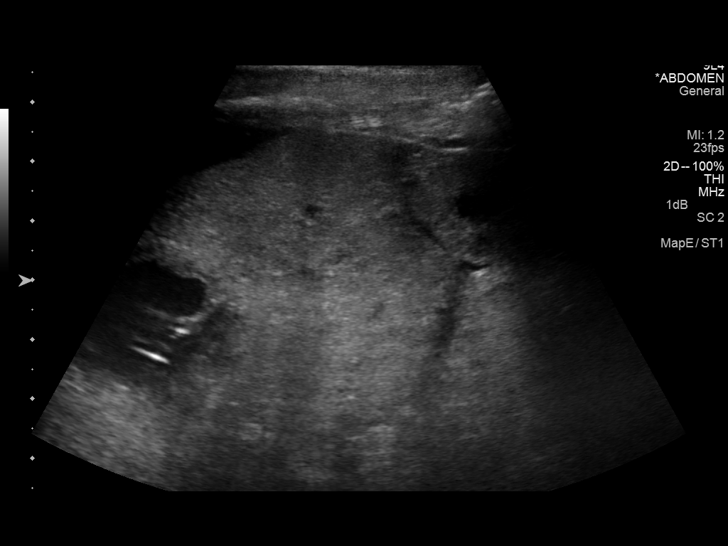
[im 10/12]
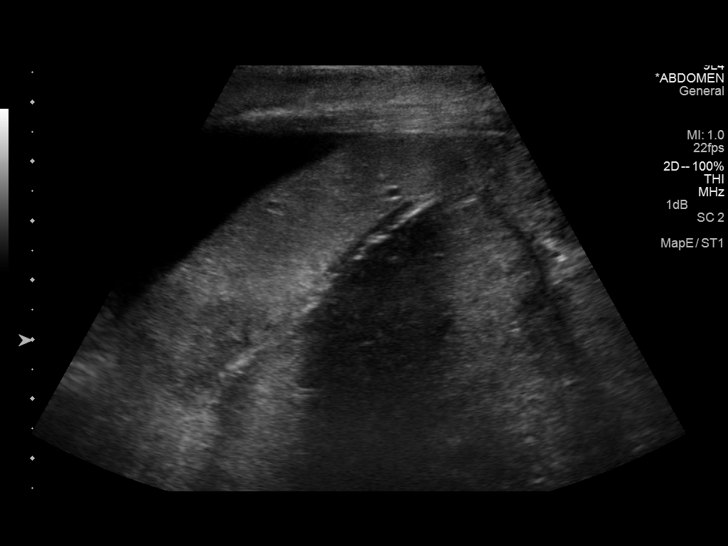
[im 11/12]
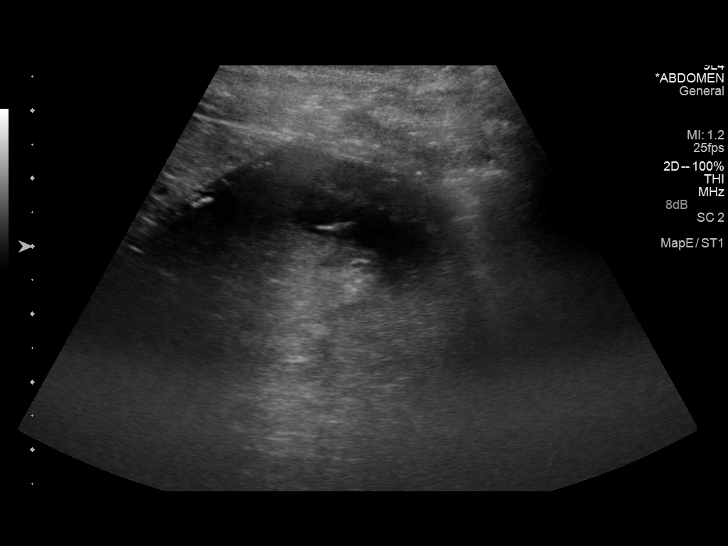
[im 12/12]
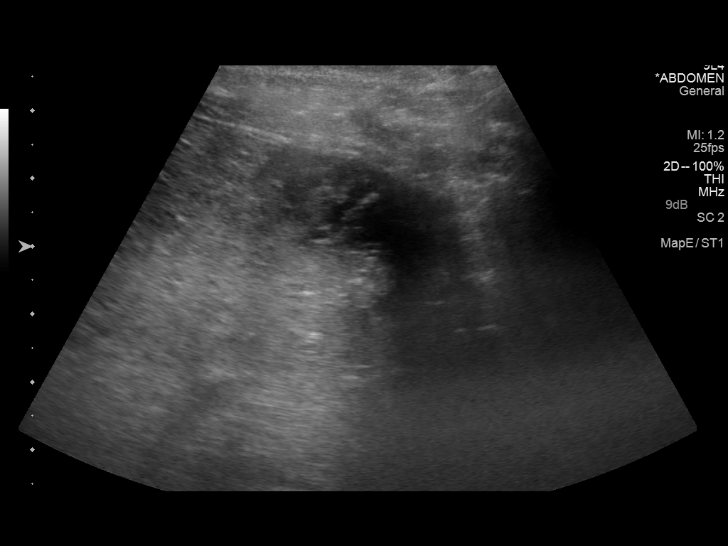

[12 of 12 positions shown; findings below may reference images not displayed]

She presents on this hospital admission with evidence of abscess in
the right liver lobe.

She also has ascitic fluid surrounding the liver, and she has been
referred for abscess drainage and diagnostic paracentesis.

EXAM:
ULTRASOUND GUIDED DRAINAGE OF RIGHT LIVER LOBE ABSCESS

ULTRASOUND-GUIDED PARACENTESIS FOR DIAGNOSTIC PURPOSE

MEDICATIONS:
1.0 mg IV Versed; 50 mcg IV Fentanyl

Total Moderate Sedation Time: 37

PROCEDURE:
The procedure, risks, benefits, and alternatives were explained to
the patient. Questions regarding the procedure were encouraged and
answered. The patient understands and consents to the procedure.

Ultrasound survey of the right upper quadrant was performed with
images stored and sent to PACs.

The right upper quad was prepped with Betadine in a sterile fashion,
and a sterile drape was applied covering the operative field. A
sterile gown and sterile gloves were used for the procedure. Local
anesthesia was provided with 1% Lidocaine.

Once the patient is prepped and draped in the usual sterile fashion,
the skin and subcutaneous tissues were generously infiltrated with
1% lidocaine for local anesthesia. A small stab incision was made
with 11 blade scalpel, and then trocar technique was used to advance
a 10 French drain in an intercostal location into gas and fluid
collection at the dome of the liver.

The drain was advanced into this collection and the pigtail catheter
was formed. Combination of gas and brown bilious fluid were
aspirated. Catheter was fixed to the skin with a StatLock.

A second collection at the inferior aspect the right liver lobe was
targeted. The skin and subcutaneous tissues were generously
infiltrated with 1% lidocaine, and a 10 French drain was advanced
into this collection with trocar technique. Pigtail catheter was
formed and serosanguineous fluid was aspirated from this collection.
This drain was anchored to the skin with a StatLock.

Finally, the ascitic fluid in the right upper quadrant was then
addressed by advancing yueh catheter under ultrasound guidance after
anesthesia of the skin and subcutaneous tissues with 1% lidocaine.

A total of 430 cc of ascitic fluid was aspirated.

The superior drain was attached to gravity drainage, draining the
dome of the liver abscess, and the inferior drain was attached to
gravity drainage, draining a right liver lobe lesion.

Patient tolerated the procedure well and remained hemodynamically
stable throughout.

No complications were encountered and no significant blood loss was
encountered.

COMPLICATIONS:
None.
FINDINGS: Ultrasound survey demonstrates gas and fluid collection at the liver
dome. In addition there is a smaller collection more inferiorly
along the inferior right liver lobe.

Ascitic fluid of the right upper quadrant was also identified by
ultrasound survey.

Final images demonstrate drain within the dome of the abscess
attached to gravity drain, which is the superiorly placed drain in
the right upper quadrant.

The inferiorly placed drain is within the more inferior right liver
lobe collection.
IMPRESSION: Status post ultrasound-guided drainage of 2 right-sided gas and
fluid collections. Sample sent to the lab from each collection.

Status post ultrasound-guided diagnostic paracentesis.

PLAN:
The drain that is cranially placed/superior drains the larger gas
and fluid collection of the liver dome. This was labeled drain 1.

The drain that is caudally placed/inferior drains the smaller fluid
collection at the right liver. This was labeled drain 2.
